# Patient Record
Sex: Male | Born: 2000
Health system: Southern US, Community
[De-identification: ages and names within clinical notes are randomized; demographics above are authoritative.]

## PROBLEM LIST (undated history)

## (undated) DIAGNOSIS — J45909 Unspecified asthma, uncomplicated: Secondary | ICD-10-CM

## (undated) DIAGNOSIS — L709 Acne, unspecified: Secondary | ICD-10-CM

## (undated) DIAGNOSIS — K219 Gastro-esophageal reflux disease without esophagitis: Secondary | ICD-10-CM

## (undated) DIAGNOSIS — R519 Headache, unspecified: Secondary | ICD-10-CM

## (undated) DIAGNOSIS — T7840XA Allergy, unspecified, initial encounter: Secondary | ICD-10-CM

## (undated) DIAGNOSIS — G8929 Other chronic pain: Secondary | ICD-10-CM

## (undated) DIAGNOSIS — F32A Depression, unspecified: Secondary | ICD-10-CM

## (undated) DIAGNOSIS — F419 Anxiety disorder, unspecified: Secondary | ICD-10-CM

## (undated) HISTORY — DX: Allergy, unspecified, initial encounter: T78.40XA

## (undated) HISTORY — PX: UPPER GASTROINTESTINAL ENDOSCOPY: SHX188

## (undated) HISTORY — PX: TYMPANOSTOMY TUBE PLACEMENT: SHX32

## (undated) HISTORY — DX: Headache, unspecified: R51.9

## (undated) HISTORY — DX: Gastro-esophageal reflux disease without esophagitis: K21.9

## (undated) HISTORY — DX: Anxiety disorder, unspecified: F41.9

## (undated) HISTORY — DX: Unspecified asthma, uncomplicated: J45.909

## (undated) HISTORY — DX: Other chronic pain: G89.29

## (undated) HISTORY — DX: Acne, unspecified: L70.9

## (undated) HISTORY — DX: Depression, unspecified: F32.A

---
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
## 2021-01-05 ENCOUNTER — Telehealth: Payer: No Typology Code available for payment source | Admitting: Physician Assistant

## 2021-01-05 ENCOUNTER — Other Ambulatory Visit (HOSPITAL_COMMUNITY): Payer: Self-pay

## 2021-01-05 DIAGNOSIS — K047 Periapical abscess without sinus: Secondary | ICD-10-CM

## 2021-01-05 MED ORDER — AMOXICILLIN 500 MG PO CAPS
500.0000 mg | ORAL_CAPSULE | Freq: Three times a day (TID) | ORAL | 0 refills | Status: AC
  Filled 2021-01-05: qty 30, 10d supply, fill #0

## 2021-01-05 NOTE — Progress Notes (Signed)
E-Visit for Dental Pain  We are sorry that you are not feeling well.  Here is how we plan to help!  Based on what you have shared with me in the questionnaire, it sounds like you have a dental abscess. I am glad you have scheduled an appointment with your dentist.   Continue the Aleve. You can also take Tylenol OTC between doses of Aleve when needed. I have prescribed Amoxicillin 500mg  3 times per day for 10 days for infection.  It is imperative that you see a dentist within 10 days of this eVisit to determine the cause of the dental pain and be sure it is adequately treated  A toothache or tooth pain is caused when the nerve in the root of a tooth or surrounding a tooth is irritated. Dental (tooth) infection, decay, injury, or loss of a tooth are the most common causes of dental pain. Pain may also occur after an extraction (tooth is pulled out). Pain sometimes originates from other areas and radiates to the jaw, thus appearing to be tooth pain.Bacteria growing inside your mouth can contribute to gum disease and dental decay, both of which can cause pain. A toothache occurs from inflammation of the central portion of the tooth called pulp. The pulp contains nerve endings that are very sensitive to pain. Inflammation to the pulp or pulpitis may be caused by dental cavities, trauma, and infection.    HOME CARE:   For toothaches: Over-the-counter pain medications such as acetaminophen or ibuprofen may be used. Take these as directed on the package while you arrange for a dental appointment. Avoid very cold or hot foods, because they may make the pain worse. You may get relief from biting on a cotton ball soaked in oil of cloves. You can get oil of cloves at most drug stores.  For jaw pain:  Aspirin may be helpful for problems in the joint of the jaw in adults. If pain happens every time you open your mouth widely, the temporomandibular joint (TMJ) may be the source of the pain. Yawning or taking  a large bite of food may worsen the pain. An appointment with your doctor or dentist will help you find the cause.     GET HELP RIGHT AWAY IF:  You have a high fever or chills If you have had a recent head or face injury and develop headache, light headedness, nausea, vomiting, or other symptoms that concern you after an injury to your face or mouth, you could have a more serious injury in addition to your dental injury. A facial rash associated with a toothache: This condition may improve with medication. Contact your doctor for them to decide what is appropriate. Any jaw pain occurring with chest pain: Although jaw pain is most commonly caused by dental disease, it is sometimes referred pain from other areas. People with heart disease, especially people who have had stents placed, people with diabetes, or those who have had heart surgery may have jaw pain as a symptom of heart attack or angina. If your jaw or tooth pain is associated with lightheadedness, sweating, or shortness of breath, you should see a doctor as soon as possible. Trouble swallowing or excessive pain or bleeding from gums: If you have a history of a weakened immune system, diabetes, or steroid use, you may be more susceptible to infections. Infections can often be more severe and extensive or caused by unusual organisms. Dental and gum infections in people with these conditions may require more  aggressive treatment. An abscess may need draining or IV antibiotics, for example.  MAKE SURE YOU   Understand these instructions. Will watch your condition. Will get help right away if you are not doing well or get worse.  Thank you for choosing an e-visit.  Your e-visit answers were reviewed by a board certified advanced clinical practitioner to complete your personal care plan. Depending upon the condition, your plan could have included both over the counter or prescription medications.  Please review your pharmacy choice. Make  sure the pharmacy is open so you can pick up prescription now. If there is a problem, you may contact your provider through [employer] and have the prescription routed to another pharmacy.  Your safety is important to [place]. If you have drug allergies check your prescription carefully.   For the next 24 hours you can use MyChart to ask questions about today's visit, request a non-urgent call back, or ask for a work or school excuse. You will get an email in the next two days asking about your experience. I hope that your e-visit has been valuable and will speed your recovery.

## 2021-01-05 NOTE — Progress Notes (Signed)
I have spent 5 minutes in review of e-visit questionnaire, review and updating patient chart, medical decision making and response to patient.   Keyleen Cerrato Cody Shawnita Krizek, PA-C    

## 2021-01-26 ENCOUNTER — Other Ambulatory Visit (HOSPITAL_COMMUNITY): Payer: Self-pay

## 2021-02-01 ENCOUNTER — Other Ambulatory Visit: Payer: Self-pay | Admitting: Family Medicine

## 2021-02-01 DIAGNOSIS — F320 Major depressive disorder, single episode, mild: Secondary | ICD-10-CM

## 2021-02-02 ENCOUNTER — Other Ambulatory Visit (HOSPITAL_COMMUNITY): Payer: Self-pay

## 2021-02-02 MED ORDER — ESCITALOPRAM OXALATE 10 MG PO TABS
10.0000 mg | ORAL_TABLET | Freq: Every day | ORAL | 3 refills | Status: DC
  Filled 2021-02-02: qty 30, 30d supply, fill #0
  Filled 2021-03-06: qty 30, 30d supply, fill #1
  Filled 2021-04-01: qty 30, 30d supply, fill #2
  Filled 2021-04-27: qty 30, 30d supply, fill #3

## 2021-02-02 MED ORDER — OMEPRAZOLE 40 MG PO CPDR
40.0000 mg | DELAYED_RELEASE_CAPSULE | Freq: Every day | ORAL | 1 refills | Status: DC
  Filled 2021-02-02: qty 30, 30d supply, fill #0
  Filled 2021-03-06: qty 30, 30d supply, fill #1

## 2021-03-06 ENCOUNTER — Encounter: Payer: Self-pay | Admitting: Gastroenterology

## 2021-03-06 ENCOUNTER — Other Ambulatory Visit (HOSPITAL_COMMUNITY): Payer: Self-pay

## 2021-03-06 ENCOUNTER — Other Ambulatory Visit: Payer: Self-pay | Admitting: Sports Medicine

## 2021-03-06 DIAGNOSIS — M545 Low back pain, unspecified: Secondary | ICD-10-CM

## 2021-03-06 DIAGNOSIS — G8929 Other chronic pain: Secondary | ICD-10-CM

## 2021-03-06 MED ORDER — MELOXICAM 15 MG PO TABS
15.0000 mg | ORAL_TABLET | Freq: Every morning | ORAL | 3 refills | Status: DC
  Filled 2021-03-06: qty 30, 30d supply, fill #0
  Filled 2021-04-01: qty 30, 30d supply, fill #1
  Filled 2021-05-05: qty 30, 30d supply, fill #2
  Filled 2021-06-02: qty 30, 30d supply, fill #3

## 2021-03-07 ENCOUNTER — Other Ambulatory Visit: Payer: Self-pay

## 2021-03-07 ENCOUNTER — Encounter: Payer: Self-pay | Admitting: Family Medicine

## 2021-03-07 ENCOUNTER — Ambulatory Visit (INDEPENDENT_AMBULATORY_CARE_PROVIDER_SITE_OTHER): Payer: No Typology Code available for payment source | Admitting: Family Medicine

## 2021-03-07 VITALS — BP 114/78 | HR 80 | Temp 98.7°F | Ht 70.0 in | Wt 159.0 lb

## 2021-03-07 DIAGNOSIS — R399 Unspecified symptoms and signs involving the genitourinary system: Secondary | ICD-10-CM | POA: Diagnosis not present

## 2021-03-07 DIAGNOSIS — R300 Dysuria: Secondary | ICD-10-CM | POA: Diagnosis not present

## 2021-03-07 LAB — POCT URINALYSIS DIP (CLINITEK)
Bilirubin, UA: NEGATIVE
Blood, UA: NEGATIVE
Glucose, UA: NEGATIVE mg/dL
Ketones, POC UA: NEGATIVE mg/dL
Leukocytes, UA: NEGATIVE
Nitrite, UA: NEGATIVE
POC PROTEIN,UA: NEGATIVE
Spec Grav, UA: 1.025 (ref 1.010–1.025)
Urobilinogen, UA: 0.2 E.U./dL
pH, UA: 5.5 (ref 5.0–8.0)

## 2021-03-07 NOTE — Progress Notes (Signed)
Acute Office Visit  Subjective:    Patient ID: Christopher Forbes, male    DOB: 08-22-2000, 20 y.o.   MRN: [id number]  Chief Complaint  Patient presents with   Urinary Tract Infection     Patient is in today for dysuria.  Three days ago, patient started noticing some dysuria. He describes a constant discomfort, but when he urinates it feels like he is "peeing glass" and pain is 6/10. He denies any hematuria, fevers, abdominal, flank, new back pain (hx of back pain/injury), penile/scrotal pain, swelling, rashes, discharge. Reports that he is sexually active.   Patient wanted mom to stay in the room for appointment.   Past Medical History:  Diagnosis Date   Acne    Allergy     Past Surgical History:  Procedure Laterality Date   OPEN REDUCTION INTERNAL FIXATION (ORIF) METACARPAL Right 11/19/2018   Procedure: Right small finger metacarpal open malunion repair;  Surgeon: Iran Planas, MD;  Location: [place];  Service: Orthopedics;  Laterality: Right;  90 minutes   TYMPANOSTOMY TUBE PLACEMENT Bilateral    placed age 31 have fallen out    Family History  Problem Relation Age of Onset   Diabetes Father    Hyperlipidemia Father    Hypertension Father    COPD Father    Asthma Father     Social History   Socioeconomic History   Marital status: Single    Spouse name: Not on file   Number of children: Not on file   Years of education: Not on file   Highest education level: Not on file  Occupational History   Occupation: student  Tobacco Use   Smoking status: Never   Smokeless tobacco: Never  Vaping Use   Vaping Use: Never used  Substance and Sexual Activity   Alcohol use: No    Alcohol/week: 0.0 standard drinks   Drug use: No   Sexual activity: Yes    Partners: Male  Other Topics Concern   Not on file  Social History Narrative   Patient lives with brother step sister and step dad Management consultant. Mom is involved.   Social Determinants of Health    Financial Resource Strain: Not on file  Food Insecurity: Not on file  Transportation Needs: Not on file  Physical Activity: Not on file  Stress: Not on file  Social Connections: Not on file  Intimate Partner Violence: Not on file    Outpatient Medications Prior to Visit  Medication Sig Dispense Refill   albuterol (VENTOLIN HFA) 108 (90 Base) MCG/ACT inhaler Inhale 2 puffs into the lungs every 6 (six) hours as needed for wheezing or shortness of breath. 8 g 2   albuterol (VENTOLIN HFA) 108 (90 Base) MCG/ACT inhaler Inhale 2 puffs into the lungs 3 (three) times daily then 1 to 2 puffs every 4 hours as needed for shortness of breath and bronchospasms. 18 g 0   cetirizine (ZYRTEC ALLERGY) 10 MG tablet Take 1 tablet (10 mg total) by mouth daily. 30 tablet 0   chlorhexidine (PERIDEX) 0.12 % solution Swish and spit 15ms by mouth for 2 full minutes 2 times daily 473 mL 0   COVID-19 At Home Antigen Test ([facility] COVID-19 HOME TEST) KIT Use as directed 4 each 0   escitalopram (LEXAPRO) 10 MG tablet Take 1 tablet (10 mg total) by mouth at bedtime. 30 tablet 3   fluticasone (FLONASE) 50 MCG/ACT nasal spray Place 1 spray into both nostrils daily. 16 g 0  meloxicam (MOBIC) 15 MG tablet Take 1 tablet (15 mg total) by mouth in the morning with a meal for 2 weeks then 1 tablet daily as needed for pain. (Take with food) 30 tablet 3   omeprazole (PRILOSEC) 40 MG capsule Take 1 capsule (40 mg total) by mouth daily 30 miuntes before a meal 30 capsule 1   ondansetron (ZOFRAN-ODT) 4 MG disintegrating tablet TAKE 1 TABLET (4 MG TOTAL) BY MOUTH EVERY 8 (EIGHT) HOURS AS NEEDED FOR NAUSEA OR VOMITING. 20 tablet 0   SODIUM FLUORIDE 5000 ENAMEL 1.1-5 % GEL USE TO BRUSH TEETH TWICE DAILY     Spacer/Aero-Holding Chambers (OPTICHAMBER DIAMOND) MISC      mupirocin ointment (BACTROBAN) 2 % Apply 1 application topically 2 (two) times daily. 22 g 0   predniSONE (DELTASONE) 50 MG tablet Take 1 tablet (50 mg total) by  mouth daily. 5 tablet 0   No facility-administered medications prior to visit.    Allergies  Allergen Reactions   No Healthtouch Food Allergies Nausea And Vomiting    Chicken, Milk   Eggs Or Egg-Derived Products Nausea And Vomiting    Review of Systems All review of systems negative except what is listed in the HPI     Objective:    Physical Exam Vitals reviewed.  Constitutional:      Appearance: Normal appearance.  Abdominal:     Tenderness: There is no right CVA tenderness or left CVA tenderness.  Genitourinary:    Comments: Patient deferred  Neurological:     General: No focal deficit present.     Mental Status: He is alert and oriented to person, place, and time.  Psychiatric:        Mood and Affect: Mood normal.        Behavior: Behavior normal.        Thought Content: Thought content normal.        Judgment: Judgment normal.    BP 114/78 (BP Location: Left Arm, Patient Position: Sitting, Cuff Size: Normal)    Pulse 80    Temp 98.7 F (37.1 C) (Oral)    Ht _0  (1.778 m)    Wt 159 lb (72.1 kg)    SpO2 100%    BMI 22.81 kg/m  Wt Readings from Last 3 Encounters:  03/07/21 159 lb (72.1 kg) (59 %, Z= 0.23)*  09/21/20 159 lb (72.1 kg) (62 %, Z= 0.30)*  09/01/20 158 lb (71.7 kg) (61 %, Z= 0.27)*   * Growth percentiles are based on CDC (Boys, 2-20 Years) data.    Health Maintenance Due  Topic Date Due   HIV Screening  Never done   Hepatitis C Screening  Never done    There are no preventive care reminders to display for this patient.   Lab Results  Component Value Date   TSH 4.76 (H) 05/25/2020   Lab Results  Component Value Date   WBC 3.9 (L) 09/20/2020   HGB 15.3 09/20/2020   HCT 45.8 09/20/2020   MCV 91.4 09/20/2020   PLT 204 09/20/2020   Lab Results  Component Value Date   NA 141 09/20/2020   K 4.3 09/20/2020   CO2 27 09/20/2020   GLUCOSE 83 09/20/2020   BUN 16 09/20/2020   CREATININE 0.88 09/20/2020   BILITOT 0.6 09/20/2020   AST 35  (H) 09/20/2020   ALT 47 (H) 09/20/2020   PROT 7.4 09/20/2020   CALCIUM 10.1 09/20/2020   No results found for: CHOL No results found for:  HDL No results found for: LDLCALC No results found for: TRIG No results found for: CHOLHDL No results found for: HGBA1C     Assessment & Plan:    1. UTI symptoms 2. Dysuria UA unremarkable.  Will send for microscopic urinalysis and culture. Also adding in gonorrhea/chlamydia screening. We will update patient with results.  Recommend he stay hydrated, continue good hygiene, practice safe sex.  Patient aware of signs/symptoms requiring further or urgent evaluation.  - C. trachomatis/N. gonorrhoeae RNA - Urinalysis, Routine w reflex microscopic - Urine Culture - POCT URINALYSIS DIP (CLINITEK)   Follow-up pending results or as needed.  Purcell Nails Olevia Bowens, DNP, FNP-C

## 2021-03-09 ENCOUNTER — Encounter: Payer: Self-pay | Admitting: Family Medicine

## 2021-03-09 ENCOUNTER — Other Ambulatory Visit: Payer: Self-pay | Admitting: Family Medicine

## 2021-03-09 ENCOUNTER — Other Ambulatory Visit (HOSPITAL_COMMUNITY): Payer: Self-pay

## 2021-03-09 DIAGNOSIS — R300 Dysuria: Secondary | ICD-10-CM

## 2021-03-09 DIAGNOSIS — N342 Other urethritis: Secondary | ICD-10-CM

## 2021-03-09 LAB — URINE CULTURE
MICRO NUMBER:: 12783789
Result:: NO GROWTH
SPECIMEN QUALITY:: ADEQUATE

## 2021-03-09 LAB — TIQ-MISC

## 2021-03-09 LAB — C. TRACHOMATIS/N. GONORRHOEAE RNA
C. trachomatis RNA, TMA: NOT DETECTED
N. gonorrhoeae RNA, TMA: NOT DETECTED

## 2021-03-09 MED ORDER — DOXYCYCLINE HYCLATE 100 MG PO TABS
100.0000 mg | ORAL_TABLET | Freq: Two times a day (BID) | ORAL | 0 refills | Status: AC
  Filled 2021-03-09: qty 14, 7d supply, fill #0

## 2021-03-22 ENCOUNTER — Encounter: Payer: Self-pay | Admitting: Family Medicine

## 2021-03-22 ENCOUNTER — Ambulatory Visit (INDEPENDENT_AMBULATORY_CARE_PROVIDER_SITE_OTHER): Payer: No Typology Code available for payment source | Admitting: Family Medicine

## 2021-03-22 ENCOUNTER — Ambulatory Visit (INDEPENDENT_AMBULATORY_CARE_PROVIDER_SITE_OTHER): Payer: No Typology Code available for payment source

## 2021-03-22 ENCOUNTER — Other Ambulatory Visit: Payer: Self-pay

## 2021-03-22 VITALS — BP 118/61 | HR 80 | Temp 98.6°F | Ht 70.0 in | Wt 159.0 lb

## 2021-03-22 DIAGNOSIS — R079 Chest pain, unspecified: Secondary | ICD-10-CM | POA: Diagnosis not present

## 2021-03-22 NOTE — Progress Notes (Signed)
Christopher Forbes - 21 y.o. male MRN [id number]  Date of birth: 03/17/2001  Subjective Chief Complaint  Patient presents with   chest soreness    HPI Christopher Forbes is a 21 year old male here today with complaint of chest pain.  Reports having episode of vomiting a few days ago.  Since that time he has had increased right-sided chest pain that is worsened after eating.  He denies any further nausea or vomiting.  He has not noted any blood in his stool or dark stools.  Bowels are moving normally.  He denies fever, chills or shortness of breath.  ROS:  A comprehensive ROS was completed and negative except as noted per HPI  Allergies  Allergen Reactions   No Healt[facility] Food Allergies Nausea And Vomiting    Chicken, Milk   Eggs Or Egg-Derived Products Nausea And Vomiting    Past Medical History:  Diagnosis Date   Acne    Allergy     Past Surgical History:  Procedure Laterality Date   OPEN REDUCTION INTERNAL FIXATION (ORIF) METACARPAL Right 11/19/2018   Procedure: Right small finger metacarpal open malunion repair;  Surgeon: Bradly Bienenstock, MD;  Location:  [facility];  Service: Orthopedics;  Laterality: Right;  90 minutes   TYMPANOSTOMY TUBE PLACEMENT Bilateral    placed age 21 have fallen out    Social History   Socioeconomic History   Marital status: Single    Spouse name: Not on file   Number of children: Not on file   Years of education: Not on file   Highest education level: Not on file  Occupational History   Occupation: student  Tobacco Use   Smoking status: Never   Smokeless tobacco: Never  Vaping Use   Vaping Use: Never used  Substance and Sexual Activity   Alcohol use: No    Alcohol/week: 0.0 standard drinks   Drug use: No   Sexual activity: Yes    Partners: Male  Other Topics Concern   Not on file  Social History Narrative   Patient lives with brother step sister and step dad Transport planner. Mom is involved.   Social Determinants of Health   Financial  Resource Strain: Not on file  Food Insecurity: Not on file  Transportation Needs: Not on file  Physical Activity: Not on file  Stress: Not on file  Social Connections: Not on file    Family History  Problem Relation Age of Onset   Diabetes Father    Hyperlipidemia Father    Hypertension Father    COPD Father    Asthma Father     Health Maintenance  Topic Date Due   HIV Screening  Never done   Hepatitis C Screening  Never done   COVID-19 Vaccine (3 - Booster for Pfizer series) 03/23/2021 (Originally 02/04/2020)   TETANUS/TDAP  01/08/2022   Pneumococcal Vaccine 38-72 Years old  Completed   HPV VACCINES  Completed     ----------------------------------------------------------------------------------------------------------------------------------------------------------------------------------------------------------------- Physical Exam BP 118/61 (BP Location: Left Arm, Patient Position: Sitting, Cuff Size: Normal)    Pulse 80    Temp 98.6 F (37 C)    Ht 5\' 10"  (1.778 m)    Wt 159 lb (72.1 kg)    SpO2 100%    BMI 22.81 kg/m   Physical Exam Constitutional:      Appearance: Normal appearance.  Eyes:     General: No scleral icterus. Cardiovascular:     Rate and Rhythm: Normal rate and regular rhythm.  Pulmonary:  Effort: Pulmonary effort is normal.     Breath sounds: Normal breath sounds.  Musculoskeletal:     Cervical back: Neck supple.  Neurological:     General: No focal deficit present.     Mental Status: He is alert.  Psychiatric:        Mood and Affect: Mood normal.        Behavior: Behavior normal.    ------------------------------------------------------------------------------------------------------------------------------------------------------------------------------------------------------------------- Assessment and Plan  Chest pain I do have concern about possible esophageal rupture with new chest pain that developed after vomiting and is worse  after eating.  Stat CT of the chest ordered today as esophagram is not readily available at our facility.  Update: CT chest is negative.  Recommend increasing PPI for now.   No orders of the defined types were placed in this encounter.   No follow-ups on file.    This visit occurred during the SARS-CoV-2 public health emergency.  Safety protocols were in place, including screening questions prior to the visit, additional usage of staff PPE, and extensive cleaning of exam room while observing appropriate contact time as indicated for disinfecting solutions.

## 2021-03-22 NOTE — Assessment & Plan Note (Signed)
I do have concern about possible esophageal rupture with new chest pain that developed after vomiting and is worse after eating.  Stat CT of the chest ordered today as esophagram is not readily available at our facility.  Update: CT chest is negative.  Recommend increasing PPI for now.

## 2021-03-27 ENCOUNTER — Other Ambulatory Visit: Payer: Self-pay | Admitting: Family Medicine

## 2021-03-27 ENCOUNTER — Other Ambulatory Visit (HOSPITAL_COMMUNITY): Payer: Self-pay

## 2021-03-27 MED ORDER — OMEPRAZOLE 40 MG PO CPDR
40.0000 mg | DELAYED_RELEASE_CAPSULE | Freq: Every day | ORAL | 1 refills | Status: DC
  Filled 2021-03-27: qty 30, 30d supply, fill #0

## 2021-03-28 ENCOUNTER — Other Ambulatory Visit (HOSPITAL_COMMUNITY): Payer: Self-pay

## 2021-03-28 ENCOUNTER — Other Ambulatory Visit: Payer: Self-pay | Admitting: Family Medicine

## 2021-03-28 MED ORDER — OMEPRAZOLE 40 MG PO CPDR
40.0000 mg | DELAYED_RELEASE_CAPSULE | Freq: Two times a day (BID) | ORAL | 3 refills | Status: DC
  Filled 2021-03-28 (×2): qty 60, 30d supply, fill #0
  Filled 2021-04-27: qty 60, 30d supply, fill #1
  Filled 2021-07-03: qty 60, 30d supply, fill #2
  Filled 2021-08-24: qty 60, 30d supply, fill #3

## 2021-04-01 ENCOUNTER — Other Ambulatory Visit (HOSPITAL_COMMUNITY): Payer: Self-pay

## 2021-04-03 ENCOUNTER — Other Ambulatory Visit (HOSPITAL_COMMUNITY): Payer: Self-pay

## 2021-04-04 ENCOUNTER — Other Ambulatory Visit (HOSPITAL_COMMUNITY): Payer: Self-pay

## 2021-04-04 ENCOUNTER — Ambulatory Visit: Payer: No Typology Code available for payment source | Admitting: Family Medicine

## 2021-04-04 ENCOUNTER — Other Ambulatory Visit (HOSPITAL_BASED_OUTPATIENT_CLINIC_OR_DEPARTMENT_OTHER): Payer: Self-pay

## 2021-04-04 HISTORY — PX: WISDOM TOOTH EXTRACTION: SHX21

## 2021-04-04 MED ORDER — HYDROCODONE-ACETAMINOPHEN 5-325 MG PO TABS
1.0000 | ORAL_TABLET | ORAL | 0 refills | Status: DC
Start: 2021-04-04 — End: 2021-04-19
  Filled 2021-04-04: qty 14, 3d supply, fill #0

## 2021-04-04 MED ORDER — PENICILLIN V POTASSIUM 500 MG PO TABS
500.0000 mg | ORAL_TABLET | Freq: Four times a day (QID) | ORAL | 0 refills | Status: DC
  Filled 2021-04-04: qty 28, 7d supply, fill #0

## 2021-04-04 MED ORDER — PENICILLIN V POTASSIUM 500 MG PO TABS
ORAL_TABLET | ORAL | 0 refills | Status: DC
  Filled 2021-04-04: qty 28, 7d supply, fill #0

## 2021-04-04 MED ORDER — HYDROCODONE-ACETAMINOPHEN 5-325 MG PO TABS
ORAL_TABLET | ORAL | 0 refills | Status: DC
  Filled 2021-04-04: qty 14, 3d supply, fill #0

## 2021-04-05 ENCOUNTER — Encounter: Payer: Self-pay | Admitting: Gastroenterology

## 2021-04-05 ENCOUNTER — Ambulatory Visit (INDEPENDENT_AMBULATORY_CARE_PROVIDER_SITE_OTHER): Payer: No Typology Code available for payment source | Admitting: Gastroenterology

## 2021-04-05 VITALS — BP 100/80 | HR 106 | Ht 70.0 in | Wt 157.0 lb

## 2021-04-05 DIAGNOSIS — R1310 Dysphagia, unspecified: Secondary | ICD-10-CM

## 2021-04-05 DIAGNOSIS — R112 Nausea with vomiting, unspecified: Secondary | ICD-10-CM | POA: Diagnosis not present

## 2021-04-05 NOTE — Patient Instructions (Signed)
If you are age 20 or older, your body mass index should be between 23-30. Your Body mass index is 22.53 kg/m. If this is out of the aforementioned range listed, please consider follow up with your Primary Care Provider.  If you are age 53 or younger, your body mass index should be between 19-25. Your Body mass index is 22.53 kg/m. If this is out of the aformentioned range listed, please consider follow up with your Primary Care Provider.   You have been scheduled for an endoscopy. Please follow written instructions given to you at your visit today. If you use inhalers (even only as needed), please bring them with you on the day of your procedure.  The [facility] providers would like to encourage you to use [facility] to communicate with providers for non-urgent requests or questions.  Due to long hold times on the telephone, sending your provider a message by [facility] may be a faster and more efficient way to get a response.  Please allow 48 business hours for a response.  Please remember that this is for non-urgent requests.   It was a pleasure to see you today!  Thank you for trusting me with your gastrointestinal care!    Scott E.Candis Schatz, MD

## 2021-04-05 NOTE — Progress Notes (Signed)
HPI : Christopher Forbes is a very pleasant 21 year old male with asthma and history of food allergies who is referred to us by Dr. Everrett Coombeody Matthews for further evaluation of dysphagia as well as nausea vomiting with weight loss.  Patient states that he first noted swallowing issues a few years ago, but it has been worsened in the past year.  He states that food feels like it gets stuck at the top of his throat, and when he tries to wash it down with water, sometimes the water comes up and chokes him.  He does not have dysphagia with all foods, it seems to be most notable with breads and starchy foods like potatoes.  He denies any episodes of having food getting stuck and having to be forcefully vomited back up.  No liquid dysphagia.  He denies symptoms of heartburn, but does have occasional acid regurgitation.  He denies problems with abdominal pain.  He does have bouts of nausea and vomiting which started within the past year.  The symptoms or not typically associated with meals.  He does not think they are associated with any stress or anxiety or emotional triggers.  He states that he will just suddenly get nauseated and feel the need to vomit.  Sometimes he vomits food, other times its just bilious liquid or phlegm.  He states that he lost about 12 pounds over the past year, but has regained a little bit of it back.  He reports regular bowel movements with occasional loose stools or constipation.  No blood in the stool. He has been taking omeprazole for almost a year, and was recently increased to twice daily.  Just a few weeks ago, he went to the emergency department because of severe chest pain following a bout of emesis.  A CT was performed which was unremarkable.  As when his omeprazole was increased to twice a day.  He also had a right upper quadrant ultrasound in July of this year to evaluate his nausea and vomiting which was unremarkable. He apparently has a history of multiple food allergies based on either  skin or blood test.  His mother reports he has allergies to egg, milk, wheat and chicken which was found when he was a young child.  Repeat allergy testing when he was a bit older indicated that he did not have an allergy to wheat, and he has been eating wheat since.  He does still eat these of the foods on occasion, and his symptoms are typically upset stomach.  He denies any history of throat swelling, hives, itching with consumption of these foods.   Past Medical History:  Diagnosis Date   Acne    Allergy    Anxiety    Asthma    Chronic headaches    Depression      Past Surgical History:  Procedure Laterality Date   OPEN REDUCTION INTERNAL FIXATION (ORIF) METACARPAL Right 11/19/2018   Procedure: Right small finger metacarpal open malunion repair;  Surgeon: Bradly Bienenstockrtmann, Fred, MD;  Location: [facility];  Service: Orthopedics;  Laterality: Right;  90 minutes   TYMPANOSTOMY TUBE PLACEMENT Bilateral    placed age 67 have fallen out   WISDOM TOOTH EXTRACTION  04/04/2021   Family History  Problem Relation Age of Onset   Diabetes Father    Hyperlipidemia Father    Hypertension Father    COPD Father    Asthma Father    Social History   Tobacco Use   Smoking status:  Never   Smokeless tobacco: Never  Vaping Use   Vaping Use: Never used  Substance Use Topics   Alcohol use: No    Alcohol/week: 0.0 standard drinks   Drug use: No   Current Outpatient Medications  Medication Sig Dispense Refill   albuterol (VENTOLIN HFA) 108 (90 Base) MCG/ACT inhaler Inhale 2 puffs into the lungs every 6 (six) hours as needed for wheezing or shortness of breath. 8 g 2   cetirizine (ZYRTEC ALLERGY) 10 MG tablet Take 1 tablet (10 mg total) by mouth daily. 30 tablet 0   chlorhexidine (PERIDEX) 0.12 % solution Swish and spit by mouth for 2 full minutes 2 times daily 473 mL 0   escitalopram (LEXAPRO) 10 MG tablet Take 1 tablet (10 mg total) by mouth at bedtime. 30 tablet 3   fluticasone  (FLONASE) 50 MCG/ACT nasal spray Place 1 spray into both nostrils daily. 16 g 0   HYDROcodone-acetaminophen (NORCO/VICODIN) 5-325 MG tablet Take 1 tablet by mouth every 4-6 hours as needed for pain 14 tablet 0   meloxicam (MOBIC) 15 MG tablet Take 1 tablet (15 mg total) by mouth in the morning with a meal for 2 weeks then 1 tablet daily as needed for pain. (Take with food) 30 tablet 3   omeprazole (PRILOSEC) 40 MG capsule Take 1 capsule (40 mg total) by mouth in the morning and at bedtime. 60 capsule 3   ondansetron (ZOFRAN-ODT) 4 MG disintegrating tablet TAKE 1 TABLET (4 MG TOTAL) BY MOUTH EVERY 8 (EIGHT) HOURS AS NEEDED FOR NAUSEA OR VOMITING. 20 tablet 0   penicillin v potassium (VEETID) 500 MG tablet Take 1 tablet (500 mg total) by mouth 4 (four) times daily until complete 28 tablet 0   SODIUM FLUORIDE 5000 ENAMEL 1.1-5 % GEL USE TO BRUSH TEETH TWICE DAILY     Spacer/Aero-Holding Chambers (OPTICHAMBER DIAMOND) MISC      No current facility-administered medications for this visit.   Allergies  Allergen Reactions   No [facility] Food Allergies Nausea And Vomiting    Chicken, Milk   Eggs Or Egg-Derived Products Nausea And Vomiting     Review of Systems: All systems reviewed and negative except where noted in HPI.    CT Chest Wo Contrast  Result Date: 03/22/2021 CLINICAL DATA:  Chest pain after vomiting and eating/drinking. Evaluation for esophageal perforation. EXAM: CT CHEST WITHOUT CONTRAST TECHNIQUE: Multidetector CT imaging of the chest was performed following the standard protocol without IV contrast. COMPARISON:  Chest radiographs 09/01/2020 FINDINGS: Cardiovascular: Normal caliber of the thoracic aorta. Normal heart size. No pericardial effusion. Mediastinum/Nodes: No enlarged axillary, mediastinal, or hilar lymph nodes are identified within limitations of noncontrast technique. The esophagus is largely collapsed. No fluid collection or inflammatory changes are evident in the  mediastinum. Unremarkable thyroid. Small volume homogeneous triangular soft tissue in the anterior mediastinum compatible with residual thymus. Lungs/Pleura: No pleural effusion or pneumothorax. Clear lungs. Patent central airways. Upper Abdomen: Unremarkable. Musculoskeletal: No acute osseous abnormality or suspicious osseous lesion. Small Schmorl's nodes in the mid and lower thoracic spine. Fusion of the left first and second ribs. IMPRESSION: No acute abnormality identified in the chest. If there is persistent clinical concern for an esophageal perforation, consider an esophagram for further evaluation. Electronically Signed   By: Sebastian Ache M.D.   On: 03/22/2021 15:38    Physical Exam: BP 100/80    Pulse (!) 106    Ht 5\' 10"  (1.778 m)    Wt 157 lb (71.2  kg)    SpO2 98%    BMI 22.53 kg/m  Constitutional: Pleasant,well-developed, Caucasian male in no acute distress.  Accompanied by mother HEENT: Normocephalic and atraumatic. Conjunctivae are normal. No scleral icterus. Neck supple.  No masses or thyromegaly Cardiovascular: Normal rate, regular rhythm.  Pulmonary/chest: Effort normal and breath sounds normal. No wheezing, rales or rhonchi. Abdominal: Soft, nondistended, nontender. Bowel sounds active throughout. There are no masses palpable. No hepatomegaly. Extremities: no edema Neurological: Alert and oriented to person place and time. Skin: Skin is warm and dry. No rashes noted. Psychiatric: Normal mood and affect. Behavior is normal.  CBC    Component Value Date/Time   WBC 3.9 (L) 09/20/2020 0000   RBC 5.01 09/20/2020 0000   HGB 15.3 09/20/2020 0000   HCT 45.8 09/20/2020 0000   PLT 204 09/20/2020 0000   MCV 91.4 09/20/2020 0000   MCH 30.5 09/20/2020 0000   MCHC 33.4 09/20/2020 0000   RDW 11.7 09/20/2020 0000   LYMPHSABS 1,271 09/20/2020 0000   EOSABS 51 09/20/2020 0000   BASOSABS 20 09/20/2020 0000    CMP     Component Value Date/Time   NA 141 09/20/2020 0000   K 4.3  09/20/2020 0000   CL 104 09/20/2020 0000   CO2 27 09/20/2020 0000   GLUCOSE 83 09/20/2020 0000   BUN 16 09/20/2020 0000   CREATININE 0.88 09/20/2020 0000   CALCIUM 10.1 09/20/2020 0000   PROT 7.4 09/20/2020 0000   AST 35 (H) 09/20/2020 0000   ALT 47 (H) 09/20/2020 0000   BILITOT 0.6 09/20/2020 0000   GFRNONAA 124 05/25/2020 0954   GFRAA 144 05/25/2020 0954     ASSESSMENT AND PLAN: 21 year old with chronic dysphagia with weight loss.  He has a history of asthma, food allergies as well as anxiety.  No significant GERD symptoms.  I suspect eosinophilic esophagitis given his age and risk factors.  We will plan for an EGD with possible dilation and esophageal biopsies.  Further recommendations will be based on these results.  Continue omeprazole twice a day for now.  Unclear if his nausea and vomiting episodes are directly related to a GI etiology versus more stress/anxiety related.  We will also assess for peptic ulcer disease and H. pylori at the time of his upper endoscopy.  Dysphagia -EGD with esophageal biopsies and possible dilation - Continue omeprazole twice a day  Nausea/vomiting - EGD with gastric biopsies -Continue omeprazole twice a day  The details, risks (including bleeding, perforation, infection, missed lesions, medication reactions and possible hospitalization or surgery if complications occur), benefits, and alternatives to EGD with possible biopsy and possible dilation were discussed with the patient and he consents to proceed.   Saadiya Wilfong E. Tomasa Rand, MD [facility]  CC:  Everrett Coombe, DO

## 2021-04-06 ENCOUNTER — Other Ambulatory Visit: Payer: Self-pay | Admitting: Family Medicine

## 2021-04-06 ENCOUNTER — Encounter: Payer: Self-pay | Admitting: Gastroenterology

## 2021-04-06 ENCOUNTER — Encounter: Payer: Self-pay | Admitting: Family Medicine

## 2021-04-06 ENCOUNTER — Other Ambulatory Visit (HOSPITAL_COMMUNITY): Payer: Self-pay

## 2021-04-06 ENCOUNTER — Other Ambulatory Visit: Payer: Self-pay

## 2021-04-06 ENCOUNTER — Ambulatory Visit (INDEPENDENT_AMBULATORY_CARE_PROVIDER_SITE_OTHER): Payer: No Typology Code available for payment source | Admitting: Family Medicine

## 2021-04-06 DIAGNOSIS — R519 Headache, unspecified: Secondary | ICD-10-CM | POA: Diagnosis not present

## 2021-04-06 DIAGNOSIS — F411 Generalized anxiety disorder: Secondary | ICD-10-CM | POA: Diagnosis not present

## 2021-04-06 MED ORDER — ONDANSETRON 4 MG PO TBDP
4.0000 mg | ORAL_TABLET | Freq: Three times a day (TID) | ORAL | 0 refills | Status: DC | PRN
  Filled 2021-04-06 – 2021-04-17 (×2): qty 20, 7d supply, fill #0

## 2021-04-06 MED ORDER — AMITRIPTYLINE HCL 25 MG PO TABS
ORAL_TABLET | ORAL | 1 refills | Status: DC
  Filled 2021-04-06: qty 30, 30d supply, fill #0
  Filled 2021-05-05: qty 30, 30d supply, fill #1

## 2021-04-06 NOTE — Progress Notes (Signed)
Christopher Forbes - 21 y.o. male MRN [id number]  Date of birth: [birth year]  Subjective Chief Complaint  Patient presents with   Follow-up    HPI Christopher Forbes is a 21 year old male here today for follow-up visit.  Recently had wisdom teeth removed and is currently taking penicillin and Norco as needed.  He does feel Lexapro continues to work well for his anxiety.  He has not noted any significant side effects from this.  He is having some difficulty with sleep as well as some headaches.  He has trouble falling asleep but is able to stay asleep once he falls asleep.  He describes headaches as along the temples as well as the back and top of the head.  These are not unilateral and he denies vision changes or nausea.  ROS:  A comprehensive ROS was completed and negative except as noted per HPI  Allergies  Allergen Reactions   Eggs Or Egg-Derived Products Nausea And Vomiting   No [facility] Food Allergies Nausea And Vomiting    Chicken, Milk    Past Medical History:  Diagnosis Date   Acne    Allergy    Anxiety    Asthma    Chronic headaches    Depression     Past Surgical History:  Procedure Laterality Date   OPEN REDUCTION INTERNAL FIXATION (ORIF) METACARPAL Right 11/19/2018   Procedure: Right small finger metacarpal open malunion repair;  Surgeon: Bradly Bienenstock, MD;  Location: [facility];  Service: Orthopedics;  Laterality: Right;  90 minutes   TYMPANOSTOMY TUBE PLACEMENT Bilateral    placed age 83 have fallen out   WISDOM TOOTH EXTRACTION  04/04/2021    Social History   Socioeconomic History   Marital status: Single    Spouse name: Not on file   Number of children: Not on file   Years of education: Not on file   Highest education level: Not on file  Occupational History   Occupation: student  Tobacco Use   Smoking status: Never   Smokeless tobacco: Never  Vaping Use   Vaping Use: Never used  Substance and Sexual Activity   Alcohol use: No     Alcohol/week: 0.0 standard drinks   Drug use: No   Sexual activity: Yes    Partners: Male  Other Topics Concern   Not on file  Social History Narrative   Patient lives with brother step sister and step dad Transport planner. Mom is involved.   Social Determinants of Health   Financial Resource Strain: Not on file  Food Insecurity: Not on file  Transportation Needs: Not on file  Physical Activity: Not on file  Stress: Not on file  Social Connections: Not on file    Family History  Problem Relation Age of Onset   Diabetes Father    Hyperlipidemia Father    Hypertension Father    COPD Father    Asthma Father     Health Maintenance  Topic Date Due   HIV Screening  Never done   Hepatitis C Screening  Never done   COVID-19 Vaccine (3 - Booster for Pfizer series) 02/04/2020   TETANUS/TDAP  01/08/2022   HPV VACCINES  Completed     ----------------------------------------------------------------------------------------------------------------------------------------------------------------------------------------------------------------- Physical Exam BP 128/81 (BP Location: Left Arm, Patient Position: Sitting, Cuff Size: Normal)    Pulse 82    Temp 97.8 F (36.6 C)    Ht 5\' 10"  (1.778 m)    Wt 157 lb (71.2 kg)    SpO2  100%    BMI 22.53 kg/m   Physical Exam Constitutional:      Appearance: Normal appearance.  Eyes:     General: No scleral icterus. Cardiovascular:     Rate and Rhythm: Normal rate and regular rhythm.  Pulmonary:     Effort: Pulmonary effort is normal.     Breath sounds: Normal breath sounds.  Musculoskeletal:     Cervical back: Neck supple.  Neurological:     General: No focal deficit present.     Mental Status: He is alert.  Psychiatric:        Mood and Affect: Mood normal.        Behavior: Behavior normal.     ------------------------------------------------------------------------------------------------------------------------------------------------------------------------------------------------------------------- Assessment and Plan  Frequent headaches Lesion to be tension headache which is likely related to his stress and anxiety.  We will try adding amitriptyline eliquis this may be helpful for his headaches as well as sleep.  Anxiety state Continue Lexapro at current strength.  Adding low-dose amitriptyline to help with insomnia as well as headache syndrome.   Meds ordered this encounter  Medications   amitriptyline (ELAVIL) 25 MG tablet    Sig: Take 0.5 tablets (12.5 mg total) by mouth at bedtime for 7 days, THEN may increase to 1 tablet (25 mg total) at bedtime.    Dispense:  30 tablet    Refill:  1    Return in about 2 months (around 06/04/2021) for GAD.    This visit occurred during the SARS-CoV-2 public health emergency.  Safety protocols were in place, including screening questions prior to the visit, additional usage of staff PPE, and extensive cleaning of exam room while observing appropriate contact time as indicated for disinfecting solutions.

## 2021-04-06 NOTE — Assessment & Plan Note (Signed)
Continue Lexapro at current strength.  Adding low-dose amitriptyline to help with insomnia as well as headache syndrome.

## 2021-04-06 NOTE — Patient Instructions (Signed)
Add 1/2 tab of amitriptyline at bedtime.  You can increase to full tab after 1 week if needed.  See me again in 2 months.

## 2021-04-06 NOTE — Assessment & Plan Note (Signed)
Lesion to be tension headache which is likely related to his stress and anxiety.  We will try adding amitriptyline eliquis this may be helpful for his headaches as well as sleep.

## 2021-04-14 ENCOUNTER — Other Ambulatory Visit (HOSPITAL_COMMUNITY): Payer: Self-pay

## 2021-04-17 ENCOUNTER — Other Ambulatory Visit (HOSPITAL_COMMUNITY): Payer: Self-pay

## 2021-04-17 MED ORDER — DOXYCYCLINE HYCLATE 100 MG PO TABS
100.0000 mg | ORAL_TABLET | Freq: Two times a day (BID) | ORAL | 0 refills | Status: DC
  Filled 2021-04-17: qty 14, 7d supply, fill #0

## 2021-04-19 ENCOUNTER — Ambulatory Visit (AMBULATORY_SURGERY_CENTER): Payer: No Typology Code available for payment source | Admitting: Gastroenterology

## 2021-04-19 ENCOUNTER — Other Ambulatory Visit: Payer: Self-pay

## 2021-04-19 ENCOUNTER — Encounter: Payer: Self-pay | Admitting: Gastroenterology

## 2021-04-19 VITALS — BP 105/57 | HR 72 | Temp 98.6°F | Resp 15 | Ht 70.0 in | Wt 157.0 lb

## 2021-04-19 DIAGNOSIS — K319 Disease of stomach and duodenum, unspecified: Secondary | ICD-10-CM

## 2021-04-19 DIAGNOSIS — R112 Nausea with vomiting, unspecified: Secondary | ICD-10-CM | POA: Diagnosis not present

## 2021-04-19 DIAGNOSIS — K219 Gastro-esophageal reflux disease without esophagitis: Secondary | ICD-10-CM | POA: Diagnosis not present

## 2021-04-19 DIAGNOSIS — R1310 Dysphagia, unspecified: Secondary | ICD-10-CM | POA: Diagnosis present

## 2021-04-19 DIAGNOSIS — K2289 Other specified disease of esophagus: Secondary | ICD-10-CM

## 2021-04-19 MED ORDER — SODIUM CHLORIDE 0.9 % IV SOLN: 500.0000 mL | Freq: Once | INTRAVENOUS | Status: DC

## 2021-04-19 NOTE — Progress Notes (Signed)
Report to PACU, RN, vss, BBS= Clear.  

## 2021-04-19 NOTE — Op Note (Signed)
[place] Patient Name: Christopher Forbes Procedure Date: 04/19/2021 2:44 PM MRN: [id number] Endoscopist: Nicki Reaper E. Candis Schatz , MD Age: 21 Referring MD:  Date of Birth: 08/15/2000 Gender: Male Account #: [id number] Procedure:                Upper GI endoscopy Indications:              Dysphagia, Nausea with vomiting Medicines:                Monitored Anesthesia Care Procedure:                Pre-Anesthesia Assessment:                           - Prior to the procedure, a History and Physical                            was performed, and patient medications and                            allergies were reviewed. The patient's tolerance of                            previous anesthesia was also reviewed. The risks                            and benefits of the procedure and the sedation                            options and risks were discussed with the patient.                            All questions were answered, and informed consent                            was obtained. Prior Anticoagulants: The patient has                            taken no previous anticoagulant or antiplatelet                            agents. ASA Grade Assessment: II - A patient with                            mild systemic disease. After reviewing the risks                            and benefits, the patient was deemed in                            satisfactory condition to undergo the procedure.                           After obtaining informed consent, the endoscope was  passed under direct vision. Throughout the                            procedure, the patient's blood pressure, pulse, and                            oxygen saturations were monitored continuously. The                            GIF [id number] was introduced through the                            mouth, and advanced to the third part of duodenum.                            The upper GI endoscopy was  accomplished without                            difficulty. The patient tolerated the procedure                            well. Scope In: Scope Out: Findings:                 The examined portions of the nasopharynx,                            oropharynx and larynx were normal.                           The examined esophagus was normal. Biopsies were                            obtained from the proximal and distal esophagus                            with cold forceps for histology of suspected                            eosinophilic esophagitis. Estimated blood loss was                            minimal. A TTS dilator was passed through the                            scope. Dilation with an 18-19-20 mm balloon dilator                            was performed to 20 mm. The dilation site was                            examined and showed no change. Estimated blood                            loss:  none.                           The entire examined stomach was normal. Biopsies                            were taken with a cold forceps for Helicobacter                            pylori testing. Estimated blood loss was minimal.                           The examined duodenum was normal. Complications:            No immediate complications. Estimated Blood Loss:     Estimated blood loss was minimal. Impression:               - The examined portions of the nasopharynx,                            oropharynx and larynx were normal.                           - Normal esophagus. Biopsied. Dilated.                           - Normal stomach. Biopsied.                           - Normal examined duodenum. Recommendation:           - Patient has a contact number available for                            emergencies. The signs and symptoms of potential                            delayed complications were discussed with the                            patient. Return to normal activities tomorrow.                             Written discharge instructions were provided to the                            patient.                           - Resume previous diet.                           - Continue present medications.                           - Await pathology results.                           -  Consider esophageal manometry for further                            evaluation of dysphagia if biopsies normal. Zahava Quant E. Candis Schatz, MD 04/19/2021 3:08:51 PM This report has been signed electronically.

## 2021-04-19 NOTE — Patient Instructions (Signed)
Please read handouts provided. Continue present medications. Await pathology results. Resume previous diet.  YOU HAD AN ENDOSCOPIC PROCEDURE TODAY AT THE [facility]:   Refer to the procedure report that was given to you for any specific questions about what was found during the examination.  If the procedure report does not answer your questions, please call your gastroenterologist to clarify.  If you requested that your care partner not be given the details of your procedure findings, then the procedure report has been included in a sealed envelope for you to review at your convenience later.  YOU SHOULD EXPECT: Some feelings of bloating in the abdomen. Passage of more gas than usual.  Walking can help get rid of the air that was put into your GI tract during the procedure and reduce the bloating. If you had a lower endoscopy (such as a colonoscopy or flexible sigmoidoscopy) you may notice spotting of blood in your stool or on the toilet paper. If you underwent a bowel prep for your procedure, you may not have a normal bowel movement for a few days.  Please Note:  You might notice some irritation and congestion in your nose or some drainage.  This is from the oxygen used during your procedure.  There is no need for concern and it should clear up in a day or so.  SYMPTOMS TO REPORT IMMEDIATELY:    Following upper endoscopy (EGD)  Vomiting of blood or coffee ground material  New chest pain or pain under the shoulder blades  Painful or persistently difficult swallowing  New shortness of breath  Fever of 100F or higher  Black, tarry-looking stools  For urgent or emergent issues, a gastroenterologist can be reached at any hour by calling [phone]. Do not use MyChart messaging for urgent concerns.    DIET:  We do recommend a small meal at first, but then you may proceed to your regular diet.  Drink plenty of fluids but you should avoid alcoholic beverages for 24  hours.  ACTIVITY:  You should plan to take it easy for the rest of today and you should NOT DRIVE or use heavy machinery until tomorrow (because of the sedation medicines used during the test).    FOLLOW UP: Our staff will call the number listed on your records 48-72 hours following your procedure to check on you and address any questions or concerns that you may have regarding the information given to you following your procedure. If we do not reach you, we will leave a message.  We will attempt to reach you two times.  During this call, we will ask if you have developed any symptoms of COVID 19. If you develop any symptoms (ie: fever, flu-like symptoms, shortness of breath, cough etc.) before then, please call [phone].  If you test positive for Covid 19 in the 2 weeks post procedure, please call and report this information to us.    If any biopsies were taken you will be contacted by phone or by letter within the next 1-3 weeks.  Please call us at [phone] if you have not heard about the biopsies in 3 weeks.    SIGNATURES/CONFIDENTIALITY: You and/or your care partner have signed paperwork which will be entered into your electronic medical record.  These signatures attest to the fact that that the information above on your After Visit Summary has been reviewed and is understood.  Full responsibility of the confidentiality of this discharge information lies with you and/or your   care-partner.  

## 2021-04-19 NOTE — Progress Notes (Signed)
Called to room to assist during endoscopic procedure.  Patient ID and intended procedure confirmed with present staff. Received instructions for my participation in the procedure from the performing physician.  

## 2021-04-19 NOTE — Progress Notes (Signed)
History and Physical Interval Note:  04/19/2021 2:44 PM  Christopher Forbes Reason  has presented today for endoscopic procedure(s), with the diagnosis of  Encounter Diagnoses  Name Primary?   Dysphagia, unspecified type Yes   Nausea and vomiting, unspecified vomiting type   .  The various methods of evaluation and treatment have been discussed with the patient and/or family. After consideration of risks, benefits and other options for treatment, the patient has consented to  the endoscopic procedure(s).   The patient's history has been reviewed, patient examined, no change in status, stable for endoscopic procedure(s).  I have reviewed the patient's chart and labs.  Questions were answered to the patient's satisfaction.     Williams Dietrick E. Tomasa Rand, MD [facility]

## 2021-04-19 NOTE — Progress Notes (Signed)
VS-DT 

## 2021-04-21 ENCOUNTER — Telehealth: Payer: Self-pay

## 2021-04-21 NOTE — Telephone Encounter (Signed)
°  Follow up Call-  Call back number 04/19/2021  Post procedure Call Back phone  # [phone]  Permission to leave phone message Yes  Some recent data might be hidden     1st follow up call made. NALM

## 2021-04-27 ENCOUNTER — Other Ambulatory Visit (HOSPITAL_COMMUNITY): Payer: Self-pay

## 2021-04-27 NOTE — Progress Notes (Signed)
Christopher Forbes,  The biopsies taken from your stomach were notable for mild reactive gastropathy which is a common finding and often related to use of certain medications (usually NSAIDs), but there was no evidence of Helicobacter pylori infection. This common finding is not felt to necessarily be a cause of any particular symptom and there is no specific treatment or further evaluation recommended.  The biopsies of your esophagus did not show any findings consistent with eosinophilic esophagitis.  They did show some mild changes suggestive of acid reflux.  Sometimes acid reflux can cause symptoms of difficulty swallowing.  I recommend you continue to take the omeprazole every day for the next 4-6 weeks and see if your symptoms are improved.  If not, we can reconsider the other test that looks at esophageal function (esophageal manometry).

## 2021-05-05 ENCOUNTER — Other Ambulatory Visit (HOSPITAL_COMMUNITY): Payer: Self-pay

## 2021-06-02 ENCOUNTER — Other Ambulatory Visit: Payer: Self-pay | Admitting: Family Medicine

## 2021-06-02 ENCOUNTER — Other Ambulatory Visit (HOSPITAL_COMMUNITY): Payer: Self-pay

## 2021-06-02 DIAGNOSIS — F320 Major depressive disorder, single episode, mild: Secondary | ICD-10-CM

## 2021-06-05 ENCOUNTER — Other Ambulatory Visit (HOSPITAL_COMMUNITY): Payer: Self-pay

## 2021-06-05 MED ORDER — ESCITALOPRAM OXALATE 10 MG PO TABS
10.0000 mg | ORAL_TABLET | Freq: Every day | ORAL | 3 refills | Status: DC
  Filled 2021-06-05: qty 30, 30d supply, fill #0

## 2021-06-05 MED ORDER — AMITRIPTYLINE HCL 25 MG PO TABS
ORAL_TABLET | ORAL | 1 refills | Status: DC
  Filled 2021-06-05: qty 30, 30d supply, fill #0
  Filled 2021-07-03: qty 30, 30d supply, fill #1

## 2021-06-06 ENCOUNTER — Ambulatory Visit (INDEPENDENT_AMBULATORY_CARE_PROVIDER_SITE_OTHER): Payer: Self-pay | Admitting: Family Medicine

## 2021-06-06 ENCOUNTER — Encounter: Payer: Self-pay | Admitting: Family Medicine

## 2021-06-06 ENCOUNTER — Other Ambulatory Visit: Payer: Self-pay

## 2021-06-06 ENCOUNTER — Other Ambulatory Visit (HOSPITAL_COMMUNITY): Payer: Self-pay

## 2021-06-06 VITALS — BP 121/85 | HR 104 | Ht 70.0 in | Wt 166.0 lb

## 2021-06-06 DIAGNOSIS — Z550 Illiteracy and low-level literacy: Secondary | ICD-10-CM

## 2021-06-06 DIAGNOSIS — M545 Low back pain, unspecified: Secondary | ICD-10-CM

## 2021-06-06 DIAGNOSIS — F819 Developmental disorder of scholastic skills, unspecified: Secondary | ICD-10-CM

## 2021-06-06 DIAGNOSIS — F320 Major depressive disorder, single episode, mild: Secondary | ICD-10-CM

## 2021-06-06 DIAGNOSIS — M546 Pain in thoracic spine: Secondary | ICD-10-CM

## 2021-06-06 DIAGNOSIS — F411 Generalized anxiety disorder: Secondary | ICD-10-CM

## 2021-06-06 MED ORDER — ESCITALOPRAM OXALATE 20 MG PO TABS
20.0000 mg | ORAL_TABLET | Freq: Every day | ORAL | 1 refills | Status: DC
  Filled 2021-06-06: qty 90, 90d supply, fill #0
  Filled 2021-08-31: qty 90, 90d supply, fill #1

## 2021-06-06 NOTE — Patient Instructions (Signed)
Increase lexapro to 20mg  daily.  Updated prescription has been sent in.  ?Follow up in 3 months or sooner if needed.  ?

## 2021-06-06 NOTE — Assessment & Plan Note (Signed)
Referral placed to neuropsychology for further evaluation. ?

## 2021-06-06 NOTE — Assessment & Plan Note (Signed)
She has continued back pain.  Referral placed for MRI of thoracic and lumbar spine. ?

## 2021-06-06 NOTE — Assessment & Plan Note (Signed)
Increasing Lexapro to 20 mg daily. 

## 2021-06-06 NOTE — Progress Notes (Signed)
?Christopher Forbes - 21 y.o. male MRN [id number]  Date of birth: July 11, 2000 ? ?Subjective ?No chief complaint on file. ? ? ?HPI ?Christopher Forbes here today for follow-up visit. ? ?Continues to have difficulty with anxiety.  He does feel that Lexapro is helpful however this feels that things could be better controlled.  He would be interested in increasing this. ? ?Continues to have chronic low back pain.  MRI ordered by Dr. Benjamin Stain last year however he was able to have this completed due to presence of ankle monitor.  He has since had this removed.  He would like to proceed with this imaging at this point. ? ?Mother is concerned about his learning difficulties in regards to him being able to work long-term.  He does have difficulty with reading and literacy.  Requesting referral for diagnostic evaluation. ? ?ROS:  A comprehensive ROS was completed and negative except as noted per HPI ? ?Allergies  ?Allergen Reactions  ? Eggs Or Egg-Derived Products Nausea And Vomiting  ? No Healthtouch Food Allergies Nausea And Vomiting  ?  Chicken, Milk  ? ? ?Past Medical History:  ?Diagnosis Date  ? Acne   ? Allergy   ? Anxiety   ? Asthma   ? Chronic headaches   ? Depression   ? GERD (gastroesophageal reflux disease)   ? ? ?Past Surgical History:  ?Procedure Laterality Date  ? OPEN REDUCTION INTERNAL FIXATION (ORIF) METACARPAL Right 11/19/2018  ? Procedure: Right small finger metacarpal open malunion repair;  Surgeon: Bradly Bienenstock, MD;  Location: [facility];  Service: Orthopedics;  Laterality: Right;  90 minutes  ? TYMPANOSTOMY TUBE PLACEMENT Bilateral   ? placed age 40 have fallen out  ? UPPER GASTROINTESTINAL ENDOSCOPY    ? WISDOM TOOTH EXTRACTION  04/04/2021  ? ? ?Social History  ? ?Socioeconomic History  ? Marital status: Single  ?  Spouse name: Not on file  ? Number of children: Not on file  ? Years of education: Not on file  ? Highest education level: Not on file  ?Occupational History  ? Occupation:  student  ?Tobacco Use  ? Smoking status: Never  ? Smokeless tobacco: Never  ?Vaping Use  ? Vaping Use: Former  ?Substance and Sexual Activity  ? Alcohol use: No  ?  Alcohol/week: 0.0 standard drinks  ? Drug use: No  ? Sexual activity: Yes  ?  Partners: Male  ?Other Topics Concern  ? Not on file  ?Social History Narrative  ? Patient lives with brother step sister and step dad [name]. Mom is involved.  ? ?Social Determinants of Health  ? ?Financial Resource Strain: Not on file  ?Food Insecurity: Not on file  ?Transportation Needs: Not on file  ?Physical Activity: Not on file  ?Stress: Not on file  ?Social Connections: Not on file  ? ? ?Family History  ?Problem Relation Age of Onset  ? Diabetes Father   ? Hyperlipidemia Father   ? Hypertension Father   ? COPD Father   ? Asthma Father   ? Colon cancer Neg Hx   ? Esophageal cancer Neg Hx   ? Rectal cancer Neg Hx   ? Stomach cancer Neg Hx   ? ? ?Health Maintenance  ?Topic Date Due  ? HIV Screening  Never done  ? Hepatitis C Screening  Never done  ? COVID-19 Vaccine (3 - Booster for Pfizer series) 02/04/2020  ? TETANUS/TDAP  01/08/2022  ? HPV VACCINES  Completed  ? ? ? ?----------------------------------------------------------------------------------------------------------------------------------------------------------------------------------------------------------------- ?  Physical Exam ?BP 121/85 (BP Location: Left Arm, Patient Position: Sitting, Cuff Size: Normal)   Pulse (!) 104   Ht 5\' 10"  (1.778 m)   Wt 166 lb (75.3 kg)   SpO2 100%   BMI 23.82 kg/m?  ? ?Physical Exam ?Constitutional:   ?   Appearance: Normal appearance.  ?Eyes:  ?   General: No scleral icterus. ?Cardiovascular:  ?   Rate and Rhythm: Normal rate and regular rhythm.  ?Pulmonary:  ?   Effort: Pulmonary effort is normal.  ?   Breath sounds: Normal breath sounds.  ?Musculoskeletal:  ?   Cervical back: Neck supple.  ?Neurological:  ?   Mental Status: He is alert.  ?Psychiatric:     ?   Mood and  Affect: Mood normal.     ?   Behavior: Behavior normal.  ? ? ?------------------------------------------------------------------------------------------------------------------------------------------------------------------------------------------------------------------- ?Assessment and Plan ? ?Anxiety state ?Increasing Lexapro to 20 mg daily. ? ?Learning disability ?Referral placed to neuropsychology for further evaluation. ? ?Back pain of thoracolumbar region ?She has continued back pain.  Referral placed for MRI of thoracic and lumbar spine. ? ? ?Meds ordered this encounter  ?Medications  ? escitalopram (LEXAPRO) 20 MG tablet  ?  Sig: Take 1 tablet (20 mg total) by mouth at bedtime.  ?  Dispense:  90 tablet  ?  Refill:  1  ? ? ?Return in about 3 months (around 09/06/2021) for Mood. ? ? ? ?This visit occurred during the SARS-CoV-2 public health emergency.  Safety protocols were in place, including screening questions prior to the visit, additional usage of staff PPE, and extensive cleaning of exam room while observing appropriate contact time as indicated for disinfecting solutions.  ? ?

## 2021-06-08 ENCOUNTER — Other Ambulatory Visit: Payer: Self-pay | Admitting: Sports Medicine

## 2021-06-08 DIAGNOSIS — Z1389 Encounter for screening for other disorder: Secondary | ICD-10-CM

## 2021-06-11 ENCOUNTER — Other Ambulatory Visit: Payer: Self-pay

## 2021-06-18 ENCOUNTER — Ambulatory Visit (INDEPENDENT_AMBULATORY_CARE_PROVIDER_SITE_OTHER): Payer: Self-pay

## 2021-06-18 DIAGNOSIS — M546 Pain in thoracic spine: Secondary | ICD-10-CM

## 2021-06-18 DIAGNOSIS — M545 Low back pain, unspecified: Secondary | ICD-10-CM

## 2021-06-26 ENCOUNTER — Other Ambulatory Visit: Payer: Self-pay | Admitting: Family Medicine

## 2021-06-26 DIAGNOSIS — G8929 Other chronic pain: Secondary | ICD-10-CM

## 2021-06-26 DIAGNOSIS — M4200 Juvenile osteochondrosis of spine, site unspecified: Secondary | ICD-10-CM

## 2021-07-03 ENCOUNTER — Other Ambulatory Visit (HOSPITAL_COMMUNITY): Payer: Self-pay

## 2021-07-03 ENCOUNTER — Other Ambulatory Visit: Payer: Self-pay | Admitting: Family Medicine

## 2021-07-03 ENCOUNTER — Other Ambulatory Visit: Payer: Self-pay | Admitting: Sports Medicine

## 2021-07-03 DIAGNOSIS — G8929 Other chronic pain: Secondary | ICD-10-CM

## 2021-07-03 MED ORDER — ONDANSETRON 4 MG PO TBDP
4.0000 mg | ORAL_TABLET | Freq: Three times a day (TID) | ORAL | 0 refills | Status: DC | PRN
Start: 2021-07-03 — End: 2021-11-18
  Filled 2021-07-03: qty 20, 7d supply, fill #0

## 2021-07-03 MED ORDER — MELOXICAM 15 MG PO TABS
15.0000 mg | ORAL_TABLET | Freq: Every morning | ORAL | 2 refills | Status: DC
  Filled 2021-07-03: qty 30, 30d supply, fill #0
  Filled 2021-08-02: qty 30, 30d supply, fill #1
  Filled 2021-08-31: qty 30, 30d supply, fill #2

## 2021-07-04 ENCOUNTER — Other Ambulatory Visit (HOSPITAL_COMMUNITY): Payer: Self-pay

## 2021-07-26 ENCOUNTER — Ambulatory Visit: Payer: Medicaid Other | Attending: Family Medicine | Admitting: Rehabilitative and Restorative Service Providers"

## 2021-08-02 ENCOUNTER — Other Ambulatory Visit (HOSPITAL_COMMUNITY): Payer: Self-pay

## 2021-08-02 ENCOUNTER — Other Ambulatory Visit: Payer: Self-pay | Admitting: Family Medicine

## 2021-08-02 MED ORDER — AMITRIPTYLINE HCL 25 MG PO TABS
ORAL_TABLET | ORAL | 1 refills | Status: DC
  Filled 2021-08-02: qty 30, 30d supply, fill #0
  Filled 2021-08-31: qty 30, 30d supply, fill #1

## 2021-08-08 ENCOUNTER — Other Ambulatory Visit (HOSPITAL_COMMUNITY): Payer: Self-pay

## 2021-08-09 ENCOUNTER — Ambulatory Visit (INDEPENDENT_AMBULATORY_CARE_PROVIDER_SITE_OTHER): Payer: Medicaid Other | Admitting: Family Medicine

## 2021-08-09 ENCOUNTER — Other Ambulatory Visit (HOSPITAL_COMMUNITY): Payer: Self-pay

## 2021-08-09 ENCOUNTER — Encounter: Payer: Self-pay | Admitting: Family Medicine

## 2021-08-09 VITALS — BP 122/79 | HR 79 | Ht 70.0 in | Wt 168.0 lb

## 2021-08-09 DIAGNOSIS — Z91012 Allergy to eggs: Secondary | ICD-10-CM | POA: Diagnosis not present

## 2021-08-09 DIAGNOSIS — R519 Headache, unspecified: Secondary | ICD-10-CM

## 2021-08-09 DIAGNOSIS — J310 Chronic rhinitis: Secondary | ICD-10-CM

## 2021-08-09 MED ORDER — KETOROLAC TROMETHAMINE 30 MG/ML IJ SOLN
30.0000 mg | Freq: Once | INTRAMUSCULAR | Status: AC
  Administered 2021-08-09: 30 mg via INTRAMUSCULAR

## 2021-08-09 MED ORDER — PREDNISONE 50 MG PO TABS
50.0000 mg | ORAL_TABLET | Freq: Every day | ORAL | 0 refills | Status: DC
  Filled 2021-08-09: qty 5, 5d supply, fill #0

## 2021-08-09 NOTE — Assessment & Plan Note (Signed)
Seems to be tension type headache based on pattern and symptoms.  Given 30mg  Toradol injection and started on 50mg  prednisone burst x5 days.  Recommend keeping headache journal.  Printed example on AVS.  Instructed to follow up if symptoms are worsening or not resolving.

## 2021-08-09 NOTE — Progress Notes (Signed)
Christopher Forbes - 21 y.o. male MRN [id number]  Date of birth: 2000/07/27  Subjective Chief Complaint  Patient presents with   Headache    HPI Christopher Forbes is a 21 y.o. male here today with complaint of headache.  He reports that he has had a headache off and on for the past few days.  Pain radiates from the top  of the neck into the front of the forehead.  He denies any neurological changes.  He has not had light sensitivity, sensitivity to sound, nausea, vision change, fever or chills.  He reports that he is drinking plenty of fluids and is getting plenty of sleep.   Additionally he is requesting referral to an allergist for allergy testing.  Has history of egg allergy.    ROS:  A comprehensive ROS was completed and negative except as noted per HPI  Allergies  Allergen Reactions   Eggs Or Egg-Derived Products Nausea And Vomiting   No Healthtouch Food Allergies Nausea And Vomiting    Chicken, Milk    Past Medical History:  Diagnosis Date   Acne    Allergy    Anxiety    Asthma    Chronic headaches    Depression    GERD (gastroesophageal reflux disease)     Past Surgical History:  Procedure Laterality Date   OPEN REDUCTION INTERNAL FIXATION (ORIF) METACARPAL Right 11/19/2018   Procedure: Right small finger metacarpal open malunion repair;  Surgeon: Iran Planas, MD;  Location: [facility];  Service: Orthopedics;  Laterality: Right;  90 minutes   TYMPANOSTOMY TUBE PLACEMENT Bilateral    placed age 57 have fallen out   UPPER GASTROINTESTINAL ENDOSCOPY     WISDOM TOOTH EXTRACTION  04/04/2021    Social History   Socioeconomic History   Marital status: Single    Spouse name: Not on file   Number of children: Not on file   Years of education: Not on file   Highest education level: Not on file  Occupational History   Occupation: student  Tobacco Use   Smoking status: Never   Smokeless tobacco: Never  Vaping Use   Vaping Use: Former  Substance and  Sexual Activity   Alcohol use: No    Alcohol/week: 0.0 standard drinks   Drug use: No   Sexual activity: Yes    Partners: Male  Other Topics Concern   Not on file  Social History Narrative   Patient lives with brother step sister and step dad Management consultant. Mom is involved.   Social Determinants of Health   Financial Resource Strain: Not on file  Food Insecurity: Not on file  Transportation Needs: Not on file  Physical Activity: Not on file  Stress: Not on file  Social Connections: Not on file    Family History  Problem Relation Age of Onset   Diabetes Father    Hyperlipidemia Father    Hypertension Father    COPD Father    Asthma Father    Colon cancer Neg Hx    Esophageal cancer Neg Hx    Rectal cancer Neg Hx    Stomach cancer Neg Hx     Health Maintenance  Topic Date Due   COVID-19 Vaccine (3 - Booster for Schaller series) 11/17/2021 (Originally 02/04/2020)   Hepatitis C Screening  08/10/2022 (Originally 12/28/2019)   HIV Screening  08/10/2022 (Originally 12/27/2016)   TETANUS/TDAP  01/08/2022   HPV VACCINES  Completed     ----------------------------------------------------------------------------------------------------------------------------------------------------------------------------------------------------------------- Physical Exam BP 122/79 (BP  Location: Left Arm, Patient Position: Sitting, Cuff Size: Normal)   Pulse 79   Ht 5\' 10"  (1.778 m)   Wt 168 lb (76.2 kg)   SpO2 100%   BMI 24.11 kg/m   Physical Exam Constitutional:      Appearance: He is well-developed.  HENT:     Head: Normocephalic and atraumatic.  Eyes:     Extraocular Movements: Extraocular movements intact.     Pupils: Pupils are equal, round, and reactive to light.  Cardiovascular:     Rate and Rhythm: Normal rate and regular rhythm.  Pulmonary:     Effort: Pulmonary effort is normal.     Breath sounds: Normal breath sounds.  Musculoskeletal:     Cervical back: Normal range of  motion and neck supple.  Neurological:     Mental Status: He is alert and oriented to person, place, and time.     Cranial Nerves: No cranial nerve deficit.  Psychiatric:        Mood and Affect: Mood normal.        Behavior: Behavior normal.    ------------------------------------------------------------------------------------------------------------------------------------------------------------------------------------------------------------------- Assessment and Plan  Frequent headaches Seems to be tension type headache based on pattern and symptoms.  Given 30mg  Toradol injection and started on 50mg  prednisone burst x5 days.  Recommend keeping headache journal.  Printed example on AVS.  Instructed to follow up if symptoms are worsening or not resolving.    Egg allergy Referral placed to allergist.    No orders of the defined types were placed in this encounter.   No follow-ups on file.    This visit occurred during the SARS-CoV-2 public health emergency.  Safety protocols were in place, including screening questions prior to the visit, additional usage of staff PPE, and extensive cleaning of exam room while observing appropriate contact time as indicated for disinfecting solutions.

## 2021-08-09 NOTE — Patient Instructions (Signed)
Start prednisone 50mg  dailly for 5 days. Let me know if headaches are not improving.   Form - Headache Record There are many types and causes of headaches. A headache record can help guide your treatment plan. Use this form to record the details. Bring this form with you to your follow-up visits. Follow your health care provider's instructions on how to describe your headache. You may be asked to: Use a pain scale. This is a tool to rate the intensity of your headache using words or numbers. Describe what your headache feels like, such as dull, achy, throbbing, or sharp. Headache record Date: _______________ Time (from start to end): ____________________ Location of the headache: _________________________ Intensity of the headache: ____________________ Description of the headache: ______________________________________________________________ Hours of sleep the night before the headache: __________ Food or drinks before the headache started: ______________________________________________________________________________________ Events before the headache started: _______________________________________________________________________________________________ Symptoms before the headache started: __________________________________________________________________________________________ Symptoms during the headache: __________________________________________________________________________________________________ Treatment: ________________________________________________________________________________________________________________ Effect of treatment: _________________________________________________________________________________________________________ Other comments: ___________________________________________________________________________________________________________ Date: _______________ Time (from start to end): ____________________ Location of the headache:  _________________________ Intensity of the headache: ____________________ Description of the headache: ______________________________________________________________ Hours of sleep the night before the headache: __________ Food or drinks before the headache started: ______________________________________________________________________________________ Events before the headache started: ____________________________________________________________________________________________ Symptoms before the headache started: _________________________________________________________________________________________ Symptoms during the headache: _______________________________________________________________________________________________ Treatment: ________________________________________________________________________________________________________________ Effect of treatment: _________________________________________________________________________________________________________ Other comments: ___________________________________________________________________________________________________________ Date: _______________ Time (from start to end): ____________________ Location of the headache: _________________________ Intensity of the headache: ____________________ Description of the headache: ______________________________________________________________ Hours of sleep the night before the headache: __________ Food or drinks before the headache started: ______________________________________________________________________________________ Events before the headache started: ____________________________________________________________________________________________ Symptoms before the headache started: _________________________________________________________________________________________ Symptoms during the headache:  _______________________________________________________________________________________________ Treatment: ________________________________________________________________________________________________________________ Effect of treatment: _________________________________________________________________________________________________________ Other comments: ___________________________________________________________________________________________________________ Date: _______________ Time (from start to end): ____________________ Location of the headache: _________________________ Intensity of the headache: ____________________ Description of the headache: ______________________________________________________________ Hours of sleep the night before the headache: _________ Food or drinks before the headache started: ______________________________________________________________________________________ Events before the headache started: ____________________________________________________________________________________________ Symptoms before the headache started: _________________________________________________________________________________________ Symptoms during the headache: _______________________________________________________________________________________________ Treatment: ________________________________________________________________________________________________________________ Effect of treatment: _________________________________________________________________________________________________________ Other comments: ___________________________________________________________________________________________________________ Date: _______________ Time (from start to end): ____________________ Location of the headache: _________________________ Intensity of the headache: ____________________ Description of the headache:  ______________________________________________________________ Hours of sleep the night before the headache: _________ Food or drinks before the headache started: ______________________________________________________________________________________ Events before the headache started: ____________________________________________________________________________________________ Symptoms before the headache started: _________________________________________________________________________________________ Symptoms during the headache: _______________________________________________________________________________________________ Treatment: ________________________________________________________________________________________________________________ Effect of treatment: _________________________________________________________________________________________________________ Other comments: ___________________________________________________________________________________________________________ This information is not intended to replace advice given to you by your health care provider. Make sure you discuss any questions you have with your health care provider. Document Revised: 08/03/2020 Document Reviewed: 08/03/2020 Elsevier Patient Education  2023 2024.

## 2021-08-09 NOTE — Assessment & Plan Note (Signed)
Referral placed to allergist. 

## 2021-08-09 NOTE — Addendum Note (Signed)
Addended by: Ardyth Man on: 08/09/2021 11:23 AM   Modules accepted: Orders

## 2021-08-10 ENCOUNTER — Other Ambulatory Visit (HOSPITAL_COMMUNITY): Payer: Self-pay

## 2021-08-24 ENCOUNTER — Other Ambulatory Visit (HOSPITAL_COMMUNITY): Payer: Self-pay

## 2021-08-24 ENCOUNTER — Other Ambulatory Visit: Payer: Self-pay | Admitting: Family Medicine

## 2021-08-24 DIAGNOSIS — R071 Chest pain on breathing: Secondary | ICD-10-CM

## 2021-08-24 DIAGNOSIS — R0602 Shortness of breath: Secondary | ICD-10-CM

## 2021-08-24 MED ORDER — ALBUTEROL SULFATE HFA 108 (90 BASE) MCG/ACT IN AERS
2.0000 | INHALATION_SPRAY | Freq: Three times a day (TID) | RESPIRATORY_TRACT | 0 refills | Status: DC
  Filled 2021-08-24: qty 18, 17d supply, fill #0

## 2021-09-01 ENCOUNTER — Other Ambulatory Visit (HOSPITAL_COMMUNITY): Payer: Self-pay

## 2021-09-04 ENCOUNTER — Emergency Department (INDEPENDENT_AMBULATORY_CARE_PROVIDER_SITE_OTHER)
Admission: EM | Admit: 2021-09-04 | Discharge: 2021-09-04 | Disposition: A | Discharge status: Home / Self Care | Payer: Medicaid Other | Source: Home / Self Care | Attending: Family Medicine | Admitting: Family Medicine

## 2021-09-04 ENCOUNTER — Other Ambulatory Visit (HOSPITAL_COMMUNITY): Payer: Self-pay

## 2021-09-04 DIAGNOSIS — J029 Acute pharyngitis, unspecified: Secondary | ICD-10-CM | POA: Diagnosis not present

## 2021-09-04 DIAGNOSIS — J209 Acute bronchitis, unspecified: Secondary | ICD-10-CM

## 2021-09-04 LAB — POCT RAPID STREP A (OFFICE): Rapid Strep A Screen: NEGATIVE

## 2021-09-04 MED ORDER — AZITHROMYCIN 250 MG PO TABS
ORAL_TABLET | ORAL | 0 refills | Status: AC
  Filled 2021-09-04: qty 6, 5d supply, fill #0

## 2021-09-04 NOTE — Discharge Instructions (Signed)
Drink lots of fluids Continue your over-the-counter cough and cold medicine Take the azithromycin as directed See your doctor if not improving by the end of the week

## 2021-09-04 NOTE — ED Provider Notes (Signed)
[facility]    CSN: [id number] Arrival date & time: 09/04/21  0909      History   Chief Complaint Chief Complaint  Patient presents with   Cough   Sore Throat    HPI Vershawn Westrup is a 21 y.o. male.   HPI  Patient's been sick for 3-4 days.  He has had cough, brown sputum, shortness of breath, sore throat, head pressure, headache  Past Medical History:  Diagnosis Date   Acne    Allergy    Anxiety    Asthma    Chronic headaches    Depression    GERD (gastroesophageal reflux disease)     Patient Active Problem List   Diagnosis Date Noted   Learning disability 06/06/2021   Frequent headaches 04/06/2021   Anxiety state 04/06/2021   Chest pain 03/22/2021   Polyarthritis with positive rheumatoid factor (HCC) 09/25/2020   Diarrhea 05/30/2020   Elevated TSH 05/30/2020   Nausea and vomiting 05/30/2020   Well adult exam 05/02/2020   Back pain of thoracolumbar region 12/15/2019   Patellofemoral arthralgia of left knee 12/15/2019   First degree ankle sprain, left, initial encounter 12/15/2019   Current mild episode of major depressive disorder without prior episode (HCC) 02/16/2019   Egg allergy 08/29/2016    Past Surgical History:  Procedure Laterality Date   OPEN REDUCTION INTERNAL FIXATION (ORIF) METACARPAL Right 11/19/2018   Procedure: Right small finger metacarpal open malunion repair;  Surgeon: Bradly Bienenstock, MD;  Location: [facility];  Service: Orthopedics;  Laterality: Right;  90 minutes   TYMPANOSTOMY TUBE PLACEMENT Bilateral    placed age 62 have fallen out   UPPER GASTROINTESTINAL ENDOSCOPY     WISDOM TOOTH EXTRACTION  04/04/2021       Home Medications    Prior to Admission medications   Medication Sig Start Date End Date Taking? Authorizing Provider  albuterol (VENTOLIN HFA) 108 (90 Base) MCG/ACT inhaler Inhale 2 puffs into the lungs 3 (three) times daily then 1 to 2 puffs every 4 hours as needed for shortness of breath  and bronchospasms. 08/24/21   Everrett Coombe, DO  azithromycin (ZITHROMAX Z-PAK) 250 MG tablet Take 2 tablets (500 mg total) by mouth daily for 1 day, THEN 1 tablet (250 mg total) daily for 4 days. 09/04/21 09/09/21 Yes Eustace Moore, MD  amitriptyline (ELAVIL) 25 MG tablet Take 1 tablet by mouth daily at bedtime. 08/02/21   Everrett Coombe, DO  escitalopram (LEXAPRO) 20 MG tablet Take 1 tablet (20 mg total) by mouth at bedtime. 06/06/21   Everrett Coombe, DO  meloxicam (MOBIC) 15 MG tablet Take 1 tablet (15 mg total) by mouth in the morning with a meal for 2 weeks then 1 tablet daily as needed for pain. (Take with food) 07/03/21   Monica Becton, MD  omeprazole (PRILOSEC) 40 MG capsule Take 1 capsule (40 mg total) by mouth in the morning and at bedtime. 03/28/21   Everrett Coombe, DO  ondansetron (ZOFRAN-ODT) 4 MG disintegrating tablet Take 1 tablet (4 mg total) by mouth every 8 (eight) hours as needed for nausea or vomiting. 07/03/21   Everrett Coombe, DO  SODIUM FLUORIDE 5000 ENAMEL 1.1-5 % GEL USE TO BRUSH TEETH TWICE DAILY 04/26/20   [provider]  Spacer/Aero-Holding Deretha [facility] DIAMOND) MISC  04/04/20   [provider]    Family History Family History  Problem Relation Age of Onset   Diabetes Father    Hyperlipidemia Father  Hypertension Father    COPD Father    Asthma Father    Colon cancer Neg Hx    Esophageal cancer Neg Hx    Rectal cancer Neg Hx    Stomach cancer Neg Hx     Social History Social History   Tobacco Use   Smoking status: Never   Smokeless tobacco: Never  Vaping Use   Vaping Use: Former  Substance Use Topics   Alcohol use: No    Alcohol/week: 0.0 standard drinks of alcohol   Drug use: No     Allergies   Eggs or egg-derived products and No healthtouch food allergies   Review of Systems Review of Systems  See HPI Physical Exam Triage Vital Signs ED Triage Vitals  Enc Vitals Group     BP 09/04/21 0936 132/80      Pulse Rate 09/04/21 0936 90     Resp 09/04/21 0936 14     Temp 09/04/21 0936 98.5 F (36.9 C)     Temp Source 09/04/21 0936 Oral     SpO2 09/04/21 0936 100 %     Weight 09/04/21 0938 168 lb (76.2 kg)     Height --      Head Circumference --      Peak Flow --      Pain Score 09/04/21 0937 6     Pain Loc --      Pain Edu? --      Excl. in GC? --    No data found.  Updated Vital Signs BP 132/80 (BP Location: Right Arm)   Pulse 90   Temp 98.5 F (36.9 C) (Oral)   Resp 14   Wt 76.2 kg   SpO2 100%   BMI 24.11 kg/m      Physical Exam Constitutional:      General: He is not in acute distress.    Appearance: He is well-developed and normal weight. He is not ill-appearing.  HENT:     Head: Normocephalic and atraumatic.     Right Ear: Tympanic membrane and ear canal normal.     Left Ear: Tympanic membrane and ear canal normal.     Nose: No congestion.     Mouth/Throat:     Mouth: Mucous membranes are moist.     Pharynx: Pharyngeal swelling and posterior oropharyngeal erythema present.     Tonsils: Tonsillar exudate present. 1+ on the right. 1+ on the left.  Eyes:     Conjunctiva/sclera: Conjunctivae normal.     Pupils: Pupils are equal, round, and reactive to light.  Cardiovascular:     Rate and Rhythm: Normal rate and regular rhythm.     Heart sounds: Normal heart sounds.  Pulmonary:     Effort: Pulmonary effort is normal. No respiratory distress.     Breath sounds: No stridor. No wheezing, rhonchi or rales.  Abdominal:     General: There is no distension.     Palpations: Abdomen is soft.  Musculoskeletal:        General: Normal range of motion.     Cervical back: Normal range of motion.  Lymphadenopathy:     Cervical: Cervical adenopathy present.  Skin:    General: Skin is warm and dry.  Neurological:     Mental Status: He is alert.      UC Treatments / Results  Labs (all labs ordered are listed, but only abnormal results are displayed) Labs Reviewed   CULTURE, GROUP A STREP  POCT RAPID STREP A (OFFICE)  EKG   Radiology No results found.  Procedures Procedures (including critical care time)  Medications Ordered in UC Medications - No data to display  Initial Impression / Assessment and Plan / UC Course  I have reviewed the triage vital signs and the nursing notes.  Pertinent labs & imaging results that were available during my care of the patient were reviewed by me and considered in my medical decision making (see chart for details).     Final Clinical Impressions(s) / UC Diagnoses   Final diagnoses:  Sore throat  Acute bronchitis, unspecified organism     Discharge Instructions      Drink lots of fluids Continue your over-the-counter cough and cold medicine Take the azithromycin as directed See your doctor if not improving by the end of the week   ED Prescriptions     Medication Sig Dispense Auth. Provider   azithromycin (ZITHROMAX Z-PAK) 250 MG tablet Take 2 tablets (500 mg total) by mouth daily for 1 day, THEN 1 tablet (250 mg total) daily for 4 days. 6 tablet Eustace Moore, MD      PDMP not reviewed this encounter.   Eustace Moore, MD 09/04/21 [phone]

## 2021-09-04 NOTE — ED Triage Notes (Signed)
Pt presents with c/o cough and sore throat x 3 days.

## 2021-09-06 ENCOUNTER — Ambulatory Visit (INDEPENDENT_AMBULATORY_CARE_PROVIDER_SITE_OTHER): Payer: Medicaid Other

## 2021-09-06 ENCOUNTER — Encounter: Payer: Self-pay | Admitting: Family Medicine

## 2021-09-06 ENCOUNTER — Ambulatory Visit (INDEPENDENT_AMBULATORY_CARE_PROVIDER_SITE_OTHER): Payer: Medicaid Other | Admitting: Family Medicine

## 2021-09-06 ENCOUNTER — Other Ambulatory Visit (HOSPITAL_COMMUNITY): Payer: Self-pay

## 2021-09-06 VITALS — BP 118/77 | HR 113 | Ht 70.0 in | Wt 167.0 lb

## 2021-09-06 DIAGNOSIS — M25562 Pain in left knee: Secondary | ICD-10-CM | POA: Diagnosis not present

## 2021-09-06 DIAGNOSIS — F411 Generalized anxiety disorder: Secondary | ICD-10-CM | POA: Diagnosis not present

## 2021-09-06 DIAGNOSIS — R519 Headache, unspecified: Secondary | ICD-10-CM

## 2021-09-06 DIAGNOSIS — J40 Bronchitis, not specified as acute or chronic: Secondary | ICD-10-CM | POA: Insufficient documentation

## 2021-09-06 MED ORDER — PREDNISONE 50 MG PO TABS
50.0000 mg | ORAL_TABLET | Freq: Every day | ORAL | 0 refills | Status: DC
  Filled 2021-09-06: qty 5, 5d supply, fill #0

## 2021-09-06 MED ORDER — RIZATRIPTAN BENZOATE 10 MG PO TBDP
10.0000 mg | ORAL_TABLET | ORAL | 0 refills | Status: DC | PRN
  Filled 2021-09-06: qty 10, 30d supply, fill #0

## 2021-09-06 NOTE — Assessment & Plan Note (Signed)
No significant swelling noted he does have some mild grinding and popping on McMurry test.  Recommend x-ray today.  Adding steroid for his bronchitis which hopefully will help his knee pain.  If pain continues we can consider MRI.

## 2021-09-06 NOTE — Patient Instructions (Addendum)
Start prednisone 50mg  daily x5 days.  Continue azithromycin and albuterol inhaler.   Try maxalt for headaches.    See me again in 3 months or sooner if needed.

## 2021-09-06 NOTE — Assessment & Plan Note (Signed)
Stable with Lexapro at current strength.  Recommend continuation.

## 2021-09-06 NOTE — Assessment & Plan Note (Signed)
Continue amitriptyline at current dose.  Adding Maxalt to use as needed for breakthrough symptoms.

## 2021-09-06 NOTE — Assessment & Plan Note (Signed)
Continue azithromycin.  Adding course of prednisone.  He may use albuterol as needed.

## 2021-09-06 NOTE — Progress Notes (Signed)
Christopher Forbes - 21 y.o. male MRN [id number]  Date of birth: [birth year]  Subjective No chief complaint on file.   HPI Christopher Forbes is a 21 year old male here today for follow-up visit.  Reports that he continues to have intermittent headaches.  Amitriptyline has helped some.  Reports he is not consistent about taking this.  He has not tried triptans in the past.  He would be interested in trying this for his acute headache episodes.  He was fairly stable with Lexapro.  Reports he is doing well with this.  No side effects at this time.  Recently seen in urgent care and told he had bronchitis.  He has noticed some wheezing and mild dyspnea.  He has been taking azithromycin that was prescribed for him.  He also is using albuterol inhaler.  He is also having knee pain.  Reports he was at a park recently and twisted his knee.  He noticed any swelling of the knee.  Pain worse with walking and weightbearing.  ROS:  A comprehensive ROS was completed and negative except as noted per HPI  Allergies  Allergen Reactions   Eggs Or Egg-Derived Products Nausea And Vomiting   No Healthtouch Food Allergies Nausea And Vomiting    Chicken, Milk    Past Medical History:  Diagnosis Date   Acne    Allergy    Anxiety    Asthma    Chronic headaches    Depression    GERD (gastroesophageal reflux disease)     Past Surgical History:  Procedure Laterality Date   OPEN REDUCTION INTERNAL FIXATION (ORIF) METACARPAL Right 11/19/2018   Procedure: Right small finger metacarpal open malunion repair;  Surgeon: Bradly Bienenstock, MD;  Location: [facility];  Service: Orthopedics;  Laterality: Right;  90 minutes   TYMPANOSTOMY TUBE PLACEMENT Bilateral    placed age 75 have fallen out   UPPER GASTROINTESTINAL ENDOSCOPY     WISDOM TOOTH EXTRACTION  04/04/2021    Social History   Socioeconomic History   Marital status: Single    Spouse name: Not on file   Number of children: Not on file    Years of education: Not on file   Highest education level: Not on file  Occupational History   Occupation: student  Tobacco Use   Smoking status: Never   Smokeless tobacco: Never  Vaping Use   Vaping Use: Former  Substance and Sexual Activity   Alcohol use: No    Alcohol/week: 0.0 standard drinks of alcohol   Drug use: No   Sexual activity: Yes    Partners: Male  Other Topics Concern   Not on file  Social History Narrative   Patient lives with brother step sister and step dad Transport planner. Mom is involved.   Social Determinants of Health   Financial Resource Strain: Not on file  Food Insecurity: Not on file  Transportation Needs: Not on file  Physical Activity: Not on file  Stress: Not on file  Social Connections: Not on file    Family History  Problem Relation Age of Onset   Diabetes Father    Hyperlipidemia Father    Hypertension Father    COPD Father    Asthma Father    Colon cancer Neg Hx    Esophageal cancer Neg Hx    Rectal cancer Neg Hx    Stomach cancer Neg Hx     Health Maintenance  Topic Date Due   COVID-19 Vaccine (3 - Pfizer series) 11/17/2021 (  Originally 02/04/2020)   Hepatitis C Screening  08/10/2022 (Originally 12/28/2019)   HIV Screening  08/10/2022 (Originally 12/27/2016)   TETANUS/TDAP  01/08/2022   HPV VACCINES  Completed     ----------------------------------------------------------------------------------------------------------------------------------------------------------------------------------------------------------------- Physical Exam BP 118/77 (BP Location: Right Arm, Patient Position: Sitting, Cuff Size: Normal)   Pulse (!) 113   Ht 5\' 10"  (1.778 m)   Wt 167 lb (75.8 kg)   SpO2 99%   BMI 23.96 kg/m   Physical Exam Constitutional:      Appearance: Normal appearance.  Eyes:     General: No scleral icterus. Cardiovascular:     Rate and Rhythm: Normal rate and regular rhythm.  Pulmonary:     Effort: Pulmonary effort is  normal.     Breath sounds: Normal breath sounds.  Musculoskeletal:     Cervical back: Neck supple.  Skin:    Comments: Left knee is normal to inspection and palpation.  No effusion noted.  He does have some pain with flexion.  Does have some grinding and popping with McMurray along the medial side along with some pain.  Neurological:     Mental Status: He is alert.  Psychiatric:        Mood and Affect: Mood normal.        Behavior: Behavior normal.     ------------------------------------------------------------------------------------------------------------------------------------------------------------------------------------------------------------------- Assessment and Plan  Acute pain of left knee No significant swelling noted he does have some mild grinding and popping on McMurry test.  Recommend x-ray today.  Adding steroid for his bronchitis which hopefully will help his knee pain.  If pain continues we can consider MRI.  Frequent headaches Continue amitriptyline at current dose.  Adding Maxalt to use as needed for breakthrough symptoms.  Anxiety state Stable with Lexapro at current strength.  Recommend continuation.  Bronchitis Continue azithromycin.  Adding course of prednisone.  He may use albuterol as needed.   Meds ordered this encounter  Medications   rizatriptan (MAXALT-MLT) 10 MG disintegrating tablet    Sig: Take 1 tablet (10 mg total) by mouth as needed for migraine. May repeat in 2 hours if needed    Dispense:  10 tablet    Refill:  0   predniSONE (DELTASONE) 50 MG tablet    Sig: Take 1 tablet (50 mg total) by mouth daily for 5 days    Dispense:  5 tablet    Refill:  0    Return in about 3 months (around 12/07/2021) for Headaches.    This visit occurred during the SARS-CoV-2 public health emergency.  Safety protocols were in place, including screening questions prior to the visit, additional usage of staff PPE, and extensive cleaning of exam room  while observing appropriate contact time as indicated for disinfecting solutions.

## 2021-09-07 LAB — CULTURE, GROUP A STREP: Strep A Culture: NEGATIVE

## 2021-09-22 ENCOUNTER — Ambulatory Visit (INDEPENDENT_AMBULATORY_CARE_PROVIDER_SITE_OTHER): Payer: Medicaid Other | Admitting: Sports Medicine

## 2021-09-22 ENCOUNTER — Other Ambulatory Visit: Payer: Self-pay | Admitting: Family Medicine

## 2021-09-22 ENCOUNTER — Other Ambulatory Visit (HOSPITAL_COMMUNITY): Payer: Self-pay

## 2021-09-22 DIAGNOSIS — R071 Chest pain on breathing: Secondary | ICD-10-CM

## 2021-09-22 DIAGNOSIS — R0602 Shortness of breath: Secondary | ICD-10-CM

## 2021-09-22 DIAGNOSIS — M25562 Pain in left knee: Secondary | ICD-10-CM

## 2021-09-22 MED ORDER — DICLOFENAC SODIUM 75 MG PO TBEC
75.0000 mg | DELAYED_RELEASE_TABLET | Freq: Two times a day (BID) | ORAL | 3 refills | Status: DC
  Filled 2021-09-22: qty 60, 30d supply, fill #0
  Filled 2021-11-18: qty 60, 30d supply, fill #1
  Filled 2021-11-27: qty 60, 30d supply, fill #0

## 2021-09-22 NOTE — Progress Notes (Signed)
    Procedures performed today:    None.  Independent interpretation of notes and tests performed by another provider:   None.  Brief History, Exam, Impression, and Recommendations:    Patellofemoral arthralgia of left knee Pleasant 21 year old male, about a month ago he twisted his knee a couple of times. Had immediate pain medial joint line. No swelling, he saw Dr. Ashley Royalty, x-rays were negative for fracture. On exam today he continues to have pain mostly at the medial joint line, minimal tenderness. No effusion, good motion, good strength, all ligaments are stable, negative McMurray's sign, no pain with terminal flexion. I think he just has a capsular strain. We will switch him from meloxicam to Voltaren as he has been on meloxicam for a long time. We will get him a equal hinged knee brace. Return to see me in about 4-6 weeks, MRI if no better.    ____________________________________________ Ihor Austin. Benjamin Stain, M.D., ABFM., CAQSM., AME. Primary Care and Sports Medicine [facility]  Adjunct Professor of Family Medicine  [employer]  Restaurant manager, fast food

## 2021-09-22 NOTE — Assessment & Plan Note (Signed)
Pleasant 21 year old male, about a month ago he twisted his knee a couple of times. Had immediate pain medial joint line. No swelling, he saw Dr. Ashley Royalty, x-rays were negative for fracture. On exam today he continues to have pain mostly at the medial joint line, minimal tenderness. No effusion, good motion, good strength, all ligaments are stable, negative McMurray's sign, no pain with terminal flexion. I think he just has a capsular strain. We will switch him from meloxicam to Voltaren as he has been on meloxicam for a long time. We will get him a equal hinged knee brace. Return to see me in about 4-6 weeks, MRI if no better.

## 2021-09-25 ENCOUNTER — Other Ambulatory Visit (HOSPITAL_COMMUNITY): Payer: Self-pay

## 2021-09-25 MED ORDER — RIZATRIPTAN BENZOATE 10 MG PO TBDP
10.0000 mg | ORAL_TABLET | ORAL | 0 refills | Status: DC | PRN
Start: 2021-09-25 — End: 2021-11-18
  Filled 2021-09-25 – 2021-10-02 (×2): qty 10, 30d supply, fill #0

## 2021-09-25 MED ORDER — ALBUTEROL SULFATE HFA 108 (90 BASE) MCG/ACT IN AERS
2.0000 | INHALATION_SPRAY | Freq: Three times a day (TID) | RESPIRATORY_TRACT | 0 refills | Status: DC
  Filled 2021-09-25: qty 18, 17d supply, fill #0

## 2021-09-26 ENCOUNTER — Other Ambulatory Visit (HOSPITAL_COMMUNITY): Payer: Self-pay

## 2021-09-27 ENCOUNTER — Other Ambulatory Visit (HOSPITAL_COMMUNITY): Payer: Self-pay

## 2021-10-02 ENCOUNTER — Other Ambulatory Visit (HOSPITAL_COMMUNITY): Payer: Self-pay

## 2021-11-18 ENCOUNTER — Other Ambulatory Visit: Payer: Self-pay | Admitting: Family Medicine

## 2021-11-18 DIAGNOSIS — R0602 Shortness of breath: Secondary | ICD-10-CM

## 2021-11-18 DIAGNOSIS — F320 Major depressive disorder, single episode, mild: Secondary | ICD-10-CM

## 2021-11-18 DIAGNOSIS — R071 Chest pain on breathing: Secondary | ICD-10-CM

## 2021-11-21 ENCOUNTER — Other Ambulatory Visit (HOSPITAL_COMMUNITY): Payer: Self-pay

## 2021-11-23 ENCOUNTER — Other Ambulatory Visit (HOSPITAL_COMMUNITY): Payer: Self-pay

## 2021-11-23 MED ORDER — ONDANSETRON 4 MG PO TBDP
4.0000 mg | ORAL_TABLET | Freq: Three times a day (TID) | ORAL | 0 refills | Status: DC | PRN
  Filled 2021-11-23 – 2021-11-27 (×2): qty 20, 7d supply, fill #0

## 2021-11-23 MED ORDER — ESCITALOPRAM OXALATE 20 MG PO TABS
20.0000 mg | ORAL_TABLET | Freq: Every day | ORAL | 1 refills | Status: DC
  Filled 2021-11-23 – 2021-11-27 (×2): qty 90, 90d supply, fill #0

## 2021-11-23 MED ORDER — AMITRIPTYLINE HCL 25 MG PO TABS
25.0000 mg | ORAL_TABLET | Freq: Every day | ORAL | 1 refills | Status: DC
  Filled 2021-11-23 – 2021-11-27 (×2): qty 30, 30d supply, fill #0

## 2021-11-23 MED ORDER — OMEPRAZOLE 40 MG PO CPDR
40.0000 mg | DELAYED_RELEASE_CAPSULE | Freq: Two times a day (BID) | ORAL | 3 refills | Status: DC
  Filled 2021-11-23 – 2021-11-27 (×2): qty 60, 30d supply, fill #0

## 2021-11-23 MED ORDER — RIZATRIPTAN BENZOATE 10 MG PO TBDP
10.0000 mg | ORAL_TABLET | ORAL | 0 refills | Status: DC | PRN
  Filled 2021-11-23 – 2021-11-27 (×2): qty 10, 30d supply, fill #0

## 2021-11-23 MED ORDER — ALBUTEROL SULFATE HFA 108 (90 BASE) MCG/ACT IN AERS
2.0000 | INHALATION_SPRAY | Freq: Three times a day (TID) | RESPIRATORY_TRACT | 0 refills | Status: DC
  Filled 2021-11-23 – 2021-11-27 (×2): qty 18, 17d supply, fill #0

## 2021-11-27 ENCOUNTER — Other Ambulatory Visit (HOSPITAL_COMMUNITY): Payer: Self-pay

## 2022-01-08 ENCOUNTER — Other Ambulatory Visit (HOSPITAL_COMMUNITY): Payer: Self-pay

## 2022-03-28 ENCOUNTER — Other Ambulatory Visit: Payer: Self-pay | Admitting: Family Medicine

## 2022-03-28 ENCOUNTER — Encounter (HOSPITAL_COMMUNITY): Payer: Self-pay

## 2022-03-28 ENCOUNTER — Other Ambulatory Visit (HOSPITAL_COMMUNITY): Payer: Self-pay

## 2022-03-28 DIAGNOSIS — R071 Chest pain on breathing: Secondary | ICD-10-CM

## 2022-03-28 DIAGNOSIS — R0602 Shortness of breath: Secondary | ICD-10-CM

## 2022-03-28 MED ORDER — ALBUTEROL SULFATE HFA 108 (90 BASE) MCG/ACT IN AERS
2.0000 | INHALATION_SPRAY | Freq: Three times a day (TID) | RESPIRATORY_TRACT | 0 refills | Status: DC
  Filled 2022-03-28: qty 18, 17d supply, fill #0

## 2022-04-02 ENCOUNTER — Other Ambulatory Visit (HOSPITAL_COMMUNITY): Payer: Self-pay

## 2022-04-03 ENCOUNTER — Other Ambulatory Visit: Payer: Self-pay

## 2022-04-03 ENCOUNTER — Other Ambulatory Visit (HOSPITAL_COMMUNITY): Payer: Self-pay

## 2022-04-20 IMAGING — CT CT RENAL STONE PROTOCOL
2 of 4 series · 15 of 46 positions shown, 17 images · non-contrast
Comparison: None.

CLINICAL DATA: Right flank pain radiating to right abdomen for 1
week, history of urolithiasis

EXAM:
CT ABDOMEN AND PELVIS WITHOUT CONTRAST
TECHNIQUE: Multidetector CT imaging of the abdomen and pelvis was performed
following the standard protocol without IV contrast.

[Series 2: axial st · axial · 0.77mm/px · z∈[-576,-106]mm · 12 of 108 slices shown, 14 images]
[im 9/108  soft-tissue]
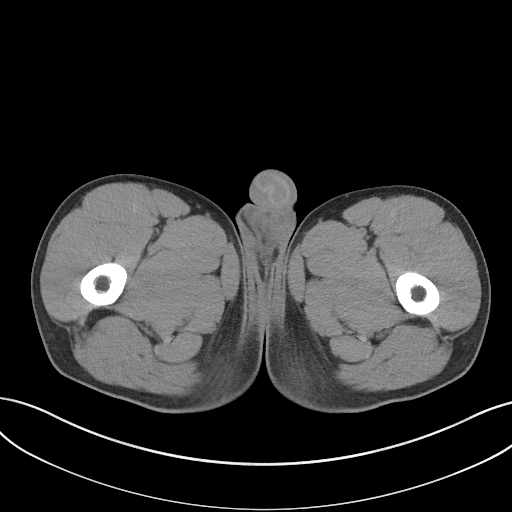
[im 9/108  bone]
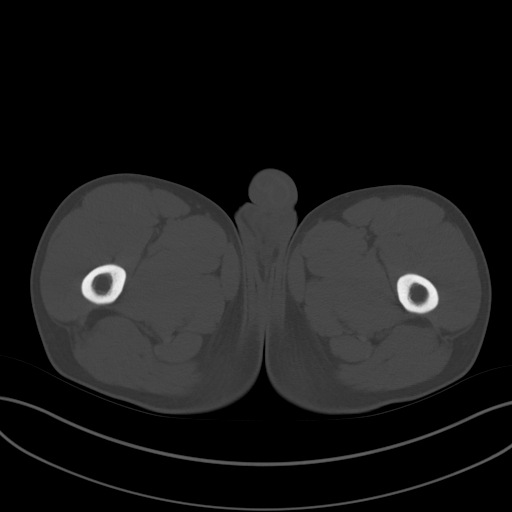
[im 18/108  soft-tissue]
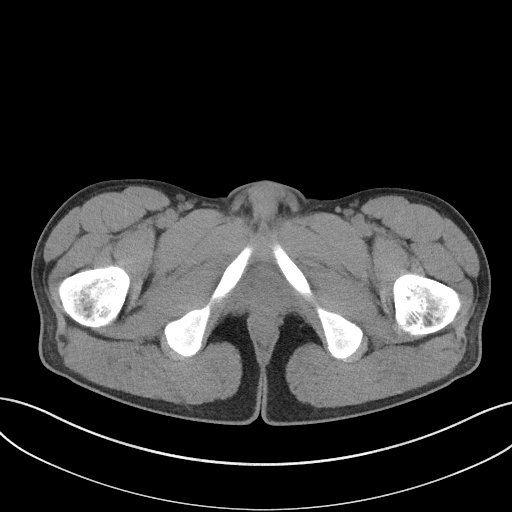
[im 26/108  soft-tissue]
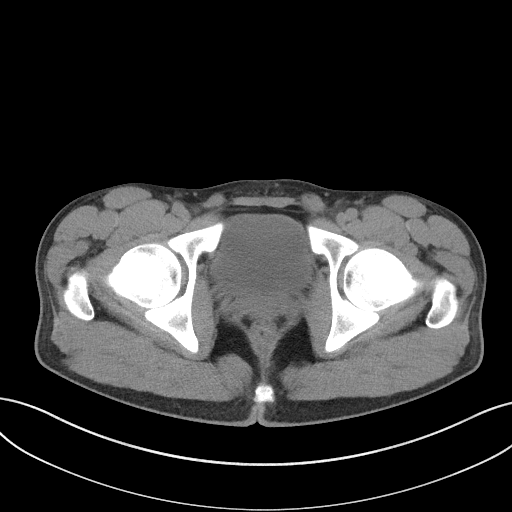
[im 35/108  soft-tissue]
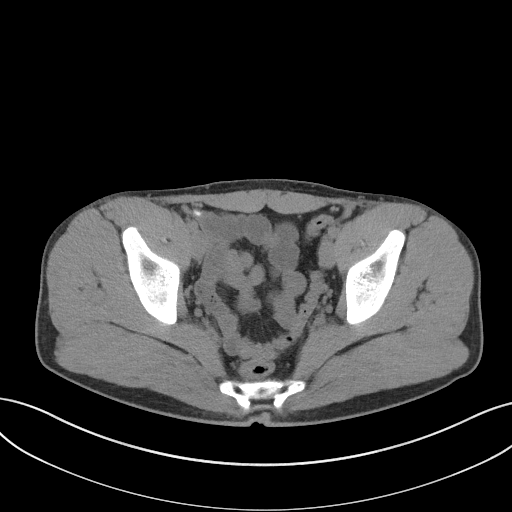
[im 43/108  soft-tissue]
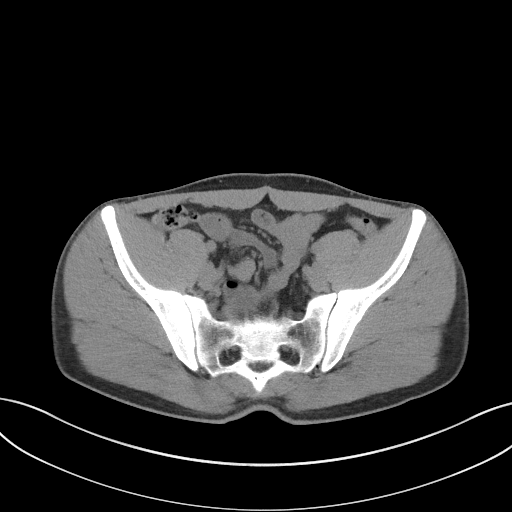
[im 52/108  soft-tissue]
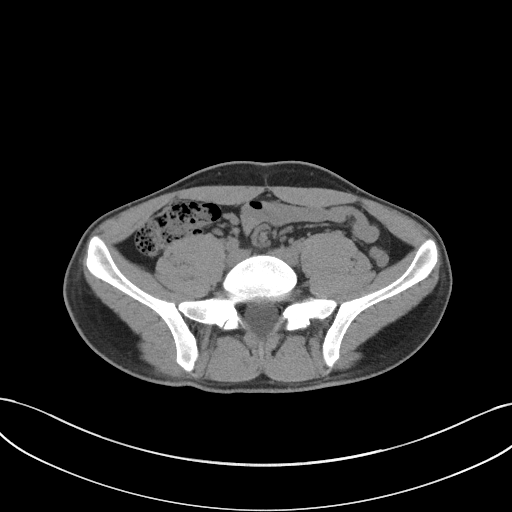
[im 60/108  soft-tissue]
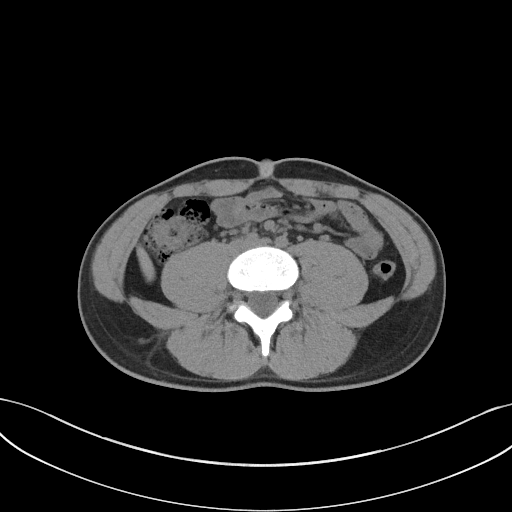
[im 69/108  soft-tissue]
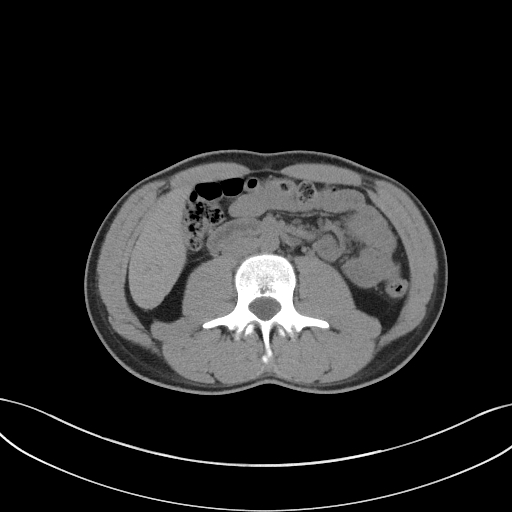
[im 78/108  soft-tissue]
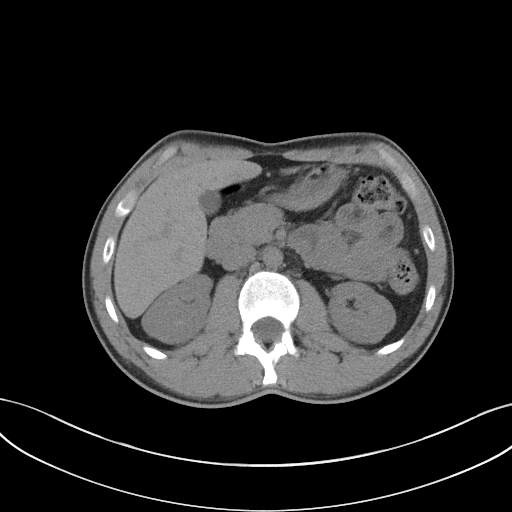
[im 78/108  bone]
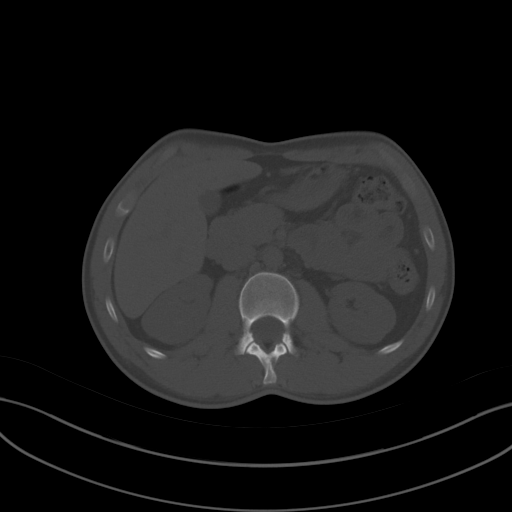
[im 86/108  soft-tissue]
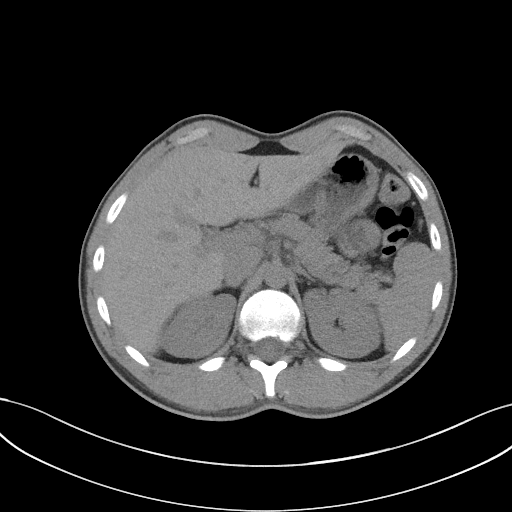
[im 95/108  soft-tissue]
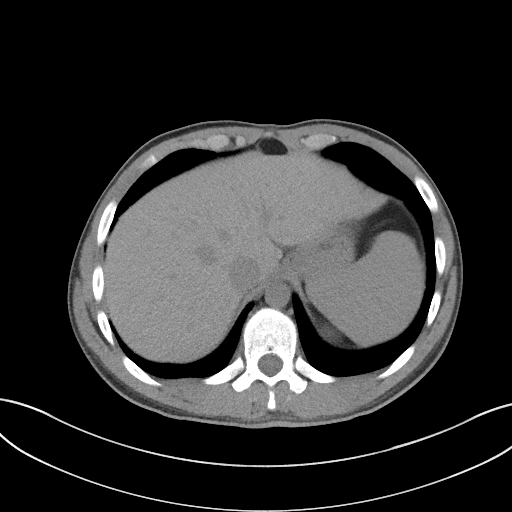
[im 103/108  soft-tissue]
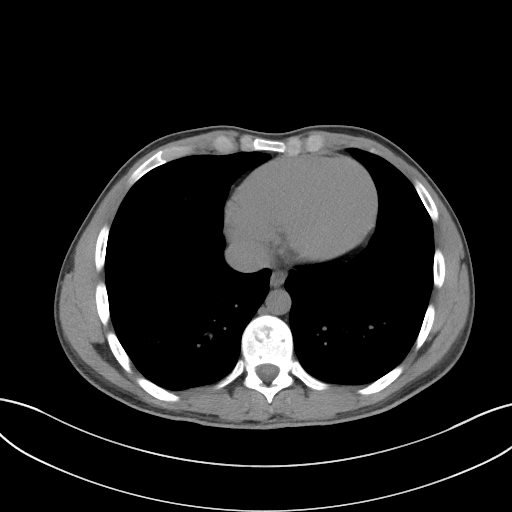

[Series 5: coronal st · coronal · 0.74mm/px · 3 of 80 slices shown]
[im 27/80  soft-tissue]
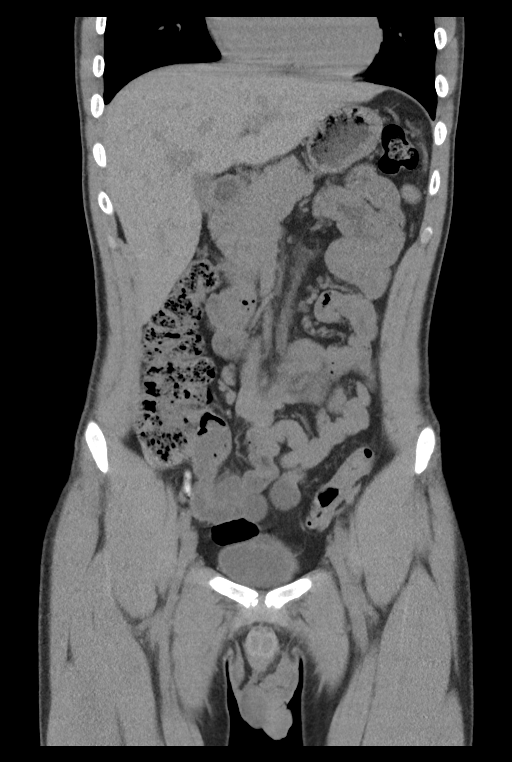
[im 36/80  soft-tissue]
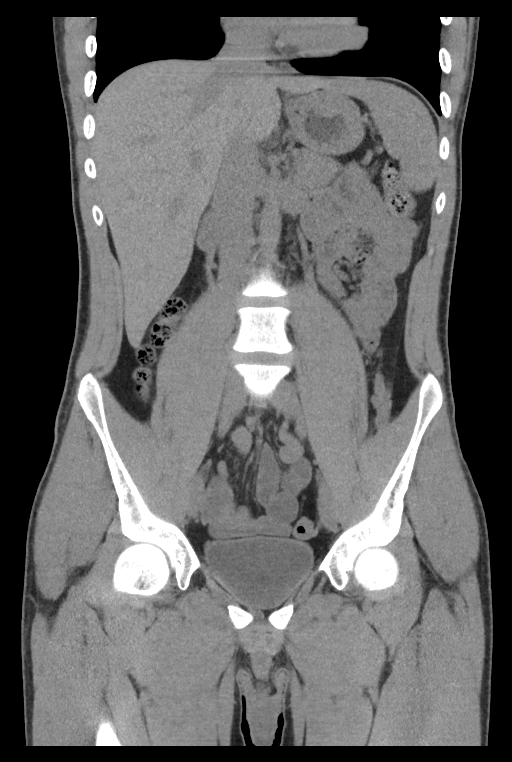
[im 44/80  soft-tissue]
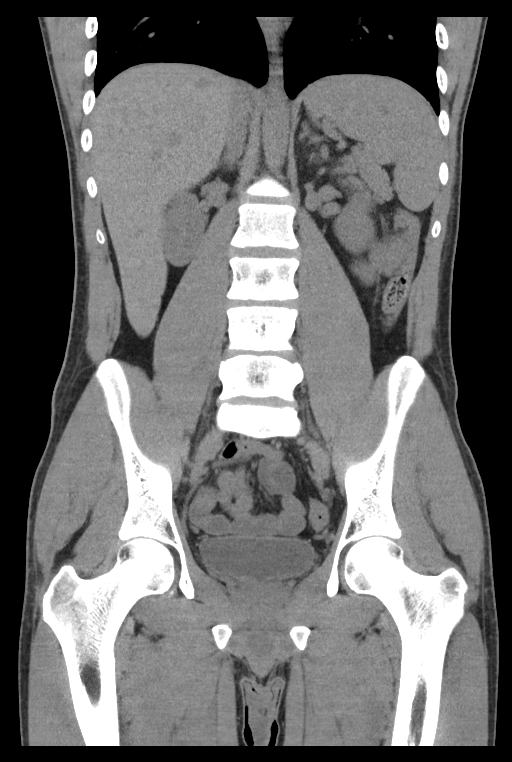

[15 of 46 positions shown; findings below may reference images not displayed]

FINDINGS: Lower chest: Lung bases are clear. Normal heart size. No pericardial
effusion.

Hepatobiliary: No visible concerning liver lesion on this unenhanced
CT. Punctate benign capsular calcifications seen along the anterior
right lobe ([DATE]) and posterior right lobe (2/32). Normal liver
attenuation. Smooth liver surface contour. Normal gallbladder and
biliary tree.

Pancreas: No pancreatic ductal dilatation or surrounding
inflammatory changes.

Spleen: Normal in size. No concerning splenic lesions.

Adrenals/Urinary Tract: Normal adrenal glands.

Stomach/Bowel: Distal esophagus, stomach and duodenal sweep are
unremarkable. No small bowel wall thickening or dilatation. No
evidence of obstruction. Appendix extending from the cecal tip
contains some hyper dense inspissated material versus fecaliths. No
periappendiceal inflammation or significant dilatation is seen
however. No colonic dilatation or wall thickening.

Vascular/Lymphatic: No significant vascular findings are present. No
enlarged abdominal or pelvic lymph nodes.

Reproductive: Prostate is unremarkable.

Other: No abdominal wall hernia or abnormality. No abdominopelvic
ascites.

Musculoskeletal: No acute or significant osseous findings.
Musculature is normal and symmetric.
IMPRESSION: 1. No urolithiasis or hydronephrosis.
2. Appendix contains some hyper dense inspissated material versus
fecaliths without periappendiceal inflammation or significant
dilatation to suggest acute appendicitis.
3. No other acute intra-abdominal process to provide cause for
patient's symptoms.

## 2022-07-12 ENCOUNTER — Other Ambulatory Visit (HOSPITAL_COMMUNITY): Payer: Self-pay

## 2022-07-12 ENCOUNTER — Ambulatory Visit (INDEPENDENT_AMBULATORY_CARE_PROVIDER_SITE_OTHER): Payer: Medicaid Other | Admitting: Sports Medicine

## 2022-07-12 DIAGNOSIS — M25562 Pain in left knee: Secondary | ICD-10-CM | POA: Diagnosis not present

## 2022-07-12 NOTE — Progress Notes (Addendum)
    Procedures performed today:    None.  Independent interpretation of notes and tests performed by another provider:   None.  Brief History, Exam, Impression, and Recommendations:    Patellofemoral arthralgia of left knee This is a pleasant 22 year old male, we saw him last year in the summertime for left knee pain, he had some anterior knee pain, x-rays were negative at the time, we added Voltaren and gave him some home physical therapy, he did this consistently 2-3 times a week, principally patellofemoral type conditioning, vastus medialis. Specifically, and for medicaid coverage purposes, he has done home physical therapy consistently for over the past year so this meets the 4-week within the last 6 months criteria, he has done it 2-3 time weekly with 3 sets daily of vastus medialis strengthening, patellar mobilization, hip abductor strengthening, straight leg raises. We also got him a hinged knee brace. He did not improve. He was incarcerated for a period of time and had persistent symptoms and worsening symptoms, he also had an injury where he impacted the front of his left knee. Today he continues to have pain, he has some swelling anteriorly, some patellofemoral crepitus, the rest of the exam is normal. Due to the persistence of symptoms, failure of conservative treatment for greater than 6 weeks we will proceed with MRI. I do suspect if we see patellofemoral fissuring or chondral defect we will do an injection.    ____________________________________________ Ihor Austin. Benjamin Stain, M.D., ABFM., CAQSM., AME. Primary Care and Sports Medicine [facility]  Adjunct Professor of Family Medicine  [employer]  Restaurant manager, fast food

## 2022-07-12 NOTE — Assessment & Plan Note (Addendum)
This is a pleasant 22 year old male, we saw him last year in the summertime for left knee pain, he had some anterior knee pain, x-rays were negative at the time, we added Voltaren and gave him some home physical therapy, he did this consistently 2-3 times a week, principally patellofemoral type conditioning, vastus medialis. Specifically, and for medicaid coverage purposes, he has done home physical therapy consistently for over the past year so this meets the 4-week within the last 6 months criteria, he has done it 2-3 time weekly with 3 sets daily of vastus medialis strengthening, patellar mobilization, hip abductor strengthening, straight leg raises. We also got him a hinged knee brace. He did not improve. He was incarcerated for a period of time and had persistent symptoms and worsening symptoms, he also had an injury where he impacted the front of his left knee. Today he continues to have pain, he has some swelling anteriorly, some patellofemoral crepitus, the rest of the exam is normal. Due to the persistence of symptoms, failure of conservative treatment for greater than 6 weeks we will proceed with MRI. I do suspect if we see patellofemoral fissuring or chondral defect we will do an injection.

## 2022-08-03 ENCOUNTER — Telehealth: Payer: Self-pay

## 2022-08-03 NOTE — Telephone Encounter (Signed)
MRI for Left knee auth denied from insurance. Per insurance:  Needs information or doctor's notes that say you did exercises (physical therapy, chiropractic treatments, or medically directed home exercise program) for four weeks in the last six months.  We also need to know the number of visits you went to. We need to know the dates you did those exercises and that you did not get better.  Provider can appeal at [phone]. Case/Ref # [id number].

## 2022-08-03 NOTE — Telephone Encounter (Signed)
Spoke to [employer] doctor, he stated we needed specifically what exercises he performed, and needed to be specifically stated that he did it for at least 4 weeks over the past 6 months, how frequently per week and per day, I included all of this and faxed the note to the below fax number with the below tracking number for reconsideration.  Reference number for call:  #[id number]. Tracking # [id number]  F: [phone]

## 2022-08-04 ENCOUNTER — Other Ambulatory Visit: Payer: Self-pay | Admitting: Family Medicine

## 2022-08-06 NOTE — Telephone Encounter (Signed)
Patient scheduled for 08/14/2022, thanks.

## 2022-08-06 NOTE — Telephone Encounter (Signed)
Pls schedule pt with Dr. Ashley Royalty for headache. Last med refill 11/2021. Last seen 11/2021. Unable to refill medication. Thx

## 2022-08-08 NOTE — Telephone Encounter (Signed)
Task completed. MRI auth appeal approved by the insurance. Imaging dept was notified to contact the patient for scheduling.

## 2022-08-09 ENCOUNTER — Ambulatory Visit: Payer: Medicaid Other | Admitting: Family Medicine

## 2022-08-14 ENCOUNTER — Encounter: Payer: Self-pay | Admitting: Family Medicine

## 2022-08-14 ENCOUNTER — Ambulatory Visit (INDEPENDENT_AMBULATORY_CARE_PROVIDER_SITE_OTHER): Payer: Medicaid Other | Admitting: Family Medicine

## 2022-08-14 VITALS — BP 113/73 | HR 78 | Ht 70.0 in | Wt 178.0 lb

## 2022-08-14 DIAGNOSIS — F411 Generalized anxiety disorder: Secondary | ICD-10-CM | POA: Diagnosis not present

## 2022-08-14 DIAGNOSIS — R519 Headache, unspecified: Secondary | ICD-10-CM

## 2022-08-14 MED ORDER — TOPIRAMATE 50 MG PO TABS
ORAL_TABLET | ORAL | 1 refills | Status: DC
Start: 2022-08-14 — End: 2022-10-08

## 2022-08-14 MED ORDER — RIZATRIPTAN BENZOATE 10 MG PO TBDP: 10.0000 mg | ORAL_TABLET | ORAL | 0 refills | Status: DC | PRN

## 2022-08-15 ENCOUNTER — Other Ambulatory Visit: Payer: Self-pay | Admitting: Family Medicine

## 2022-08-16 ENCOUNTER — Encounter: Payer: Self-pay | Admitting: Family Medicine

## 2022-08-16 MED ORDER — ONDANSETRON 4 MG PO TBDP: 4.0000 mg | ORAL_TABLET | Freq: Three times a day (TID) | ORAL | 0 refills | Status: DC | PRN

## 2022-08-19 NOTE — Assessment & Plan Note (Signed)
He continues to have frequent headaches.  He has not tried topiramate before and I think this is worth trying prevention.  Will increase this over the next few weeks.  Continue Maxalt as needed.

## 2022-08-19 NOTE — Progress Notes (Signed)
Christopher Forbes - 22 y.o. male MRN [id number]  Date of birth: [birth year]  Subjective Chief Complaint  Patient presents with   Headache    HPI Christopher Forbes is a 22 year old male here today for follow-up visit.  He reports he is needing to have medications renewed.  Recently got out of prison and has not had his regular medications.  He does have an upcoming visit with psychiatry as well.  Does report having increased migraines since his release from prison.  Has been on Maxalt but did not feel like this was working as well as it used to.  He is having a few headaches each week.  He has not tried topiramate in the past.  He was on amitriptyline for sleep as well as migraine prevention in the past.  ROS:  A comprehensive ROS was completed and negative except as noted per HPI  Allergies  Allergen Reactions   Egg-Derived Products Nausea And Vomiting   No Healthtouch Food Allergies Nausea And Vomiting    Chicken, Milk    Past Medical History:  Diagnosis Date   Acne    Allergy    Anxiety    Asthma    Chronic headaches    Depression    GERD (gastroesophageal reflux disease)     Past Surgical History:  Procedure Laterality Date   OPEN REDUCTION INTERNAL FIXATION (ORIF) METACARPAL Right 11/19/2018   Procedure: Right small finger metacarpal open malunion repair;  Surgeon: Bradly Bienenstock, MD;  Location: [facility];  Service: Orthopedics;  Laterality: Right;  90 minutes   TYMPANOSTOMY TUBE PLACEMENT Bilateral    placed age 42 have fallen out   UPPER GASTROINTESTINAL ENDOSCOPY     WISDOM TOOTH EXTRACTION  04/04/2021    Social History   Socioeconomic History   Marital status: Single    Spouse name: Not on file   Number of children: Not on file   Years of education: Not on file   Highest education level: Not on file  Occupational History   Occupation: student  Tobacco Use   Smoking status: Never   Smokeless tobacco: Never  Vaping Use   Vaping Use: Former   Substance and Sexual Activity   Alcohol use: No    Alcohol/week: 0.0 standard drinks of alcohol   Drug use: No   Sexual activity: Yes    Partners: Male  Other Topics Concern   Not on file  Social History Narrative   Patient lives with brother step sister and step dad Transport planner. Mom is involved.   Social Determinants of Health   Financial Resource Strain: High Risk (08/13/2022)   Overall Financial Resource Strain (CARDIA)    Difficulty of Paying Living Expenses: Very hard  Food Insecurity: Food Insecurity Present (08/13/2022)   Hunger Vital Sign    Worried About Running Out of Food in the Last Year: Sometimes true    Ran Out of Food in the Last Year: Sometimes true  Transportation Needs: No Transportation Needs (08/13/2022)   PRAPARE - Administrator, Civil Service (Medical): No    Lack of Transportation (Non-Medical): No  Physical Activity: Insufficiently Active (08/13/2022)   Exercise Vital Sign    Days of Exercise per Week: 2 days    Minutes of Exercise per Session: 20 min  Stress: Stress Concern Present (08/13/2022)   [employer] of Occupational Health - Occupational Stress Questionnaire    Feeling of Stress : To some extent  Social Connections: Unknown (08/13/2022)  Social Advertising account executive [NHANES]    Frequency of Communication with Friends and Family: Never    Frequency of Social Gatherings with Friends and Family: Patient declined    Attends Religious Services: Patient declined    Database administrator or Organizations: No    Attends Engineer, structural: Not on file    Marital Status: Never married    Family History  Problem Relation Age of Onset   Diabetes Father    Hyperlipidemia Father    Hypertension Father    COPD Father    Asthma Father    Colon cancer Neg Hx    Esophageal cancer Neg Hx    Rectal cancer Neg Hx    Stomach cancer Neg Hx     Health Maintenance  Topic Date Due   HIV Screening  Never done    Hepatitis C Screening  Never done   COVID-19 Vaccine (3 - 2023-24 season) 11/17/2021   DTaP/Tdap/Td (7 - Td or Tdap) 01/08/2022   HPV VACCINES  Completed     ----------------------------------------------------------------------------------------------------------------------------------------------------------------------------------------------------------------- Physical Exam BP 113/73 (BP Location: Right Arm, Patient Position: Sitting, Cuff Size: Normal)   Pulse 78   Ht 5\' 10"  (1.778 m)   Wt 178 lb (80.7 kg)   SpO2 99%   BMI 25.54 kg/m   Physical Exam Constitutional:      Appearance: He is well-developed.  HENT:     Head: Normocephalic and atraumatic.  Cardiovascular:     Rate and Rhythm: Normal rate and regular rhythm.  Pulmonary:     Effort: Pulmonary effort is normal.     Breath sounds: Normal breath sounds.  Neurological:     General: No focal deficit present.     Mental Status: He is alert.     ------------------------------------------------------------------------------------------------------------------------------------------------------------------------------------------------------------------- Assessment and Plan  Anxiety state Followed by psychiatry.  Has upcoming visit  Frequent headaches He continues to have frequent headaches.  He has not tried topiramate before and I think this is worth trying prevention.  Will increase this over the next few weeks.  Continue Maxalt as needed.   Meds ordered this encounter  Medications   topiramate (TOPAMAX) 50 MG tablet    Sig: Take 25mg  daily x1 week then increase to 50mg  daily x1, then increase to 50mg  BID x1 week    Dispense:  60 tablet    Refill:  1   rizatriptan (MAXALT-MLT) 10 MG disintegrating tablet    Sig: Take 1 tablet (10 mg total) by mouth as needed for migraine. May repeat in 2 hours if needed    Dispense:  10 tablet    Refill:  0    Return in about 8 weeks (around 10/09/2022) for F/u  Headaches.    This visit occurred during the SARS-CoV-2 public health emergency.  Safety protocols were in place, including screening questions prior to the visit, additional usage of staff PPE, and extensive cleaning of exam room while observing appropriate contact time as indicated for disinfecting solutions.

## 2022-08-19 NOTE — Assessment & Plan Note (Signed)
Followed by psychiatry.  Has upcoming visit

## 2022-08-22 ENCOUNTER — Ambulatory Visit (INDEPENDENT_AMBULATORY_CARE_PROVIDER_SITE_OTHER): Payer: Medicaid Other | Admitting: Family Medicine

## 2022-08-22 ENCOUNTER — Encounter: Payer: Self-pay | Admitting: Family Medicine

## 2022-08-22 VITALS — BP 113/73 | HR 86 | Ht 70.0 in | Wt 176.0 lb

## 2022-08-22 DIAGNOSIS — G43809 Other migraine, not intractable, without status migrainosus: Secondary | ICD-10-CM | POA: Diagnosis not present

## 2022-08-22 DIAGNOSIS — G43909 Migraine, unspecified, not intractable, without status migrainosus: Secondary | ICD-10-CM | POA: Insufficient documentation

## 2022-08-22 MED ORDER — KETOROLAC TROMETHAMINE 30 MG/ML IJ SOLN
30.0000 mg | Freq: Once | INTRAMUSCULAR | Status: AC
Start: 2022-08-22 — End: 2022-08-22
  Administered 2022-08-22: 30 mg via INTRAMUSCULAR

## 2022-08-22 MED ORDER — DEXAMETHASONE SODIUM PHOSPHATE 10 MG/ML IJ SOLN
10.0000 mg | Freq: Once | INTRAMUSCULAR | Status: AC
Start: 2022-08-22 — End: 2022-08-22
  Administered 2022-08-22: 10 mg via INTRAMUSCULAR

## 2022-08-22 NOTE — Assessment & Plan Note (Signed)
Migraine today treated with Toradol 30 mg and Decadron 10 mg.  Will continue topiramate at current strength.  Referral placed to neurology.

## 2022-08-22 NOTE — Progress Notes (Signed)
Christopher Forbes - 22 y.o. male MRN [id number]  Date of birth: 21-Jun-2000  Subjective Chief Complaint  Patient presents with   Headache   Memory Loss    HPI Christopher Forbes is a 22 year old male here today with complaint of headache.  He has had current headache for about 5 days.  He has a history of migraines.  Recently restarted on topiramate due to recurrent migraines.  He has been taken off of this while in prison recently.  He does have Maxalt as well which has not been effective.  He does have nausea and light sensitivity with current headache.  ROS:  A comprehensive ROS was completed and negative except as noted per HPI  Allergies  Allergen Reactions   Egg-Derived Products Nausea And Vomiting   No Healthtouch Food Allergies Nausea And Vomiting    Chicken, Milk    Past Medical History:  Diagnosis Date   Acne    Allergy    Anxiety    Asthma    Chronic headaches    Depression    GERD (gastroesophageal reflux disease)     Past Surgical History:  Procedure Laterality Date   OPEN REDUCTION INTERNAL FIXATION (ORIF) METACARPAL Right 11/19/2018   Procedure: Right small finger metacarpal open malunion repair;  Surgeon: Bradly Bienenstock, MD;  Location: [facility];  Service: Orthopedics;  Laterality: Right;  90 minutes   TYMPANOSTOMY TUBE PLACEMENT Bilateral    placed age 72 have fallen out   UPPER GASTROINTESTINAL ENDOSCOPY     WISDOM TOOTH EXTRACTION  04/04/2021    Social History   Socioeconomic History   Marital status: Single    Spouse name: Not on file   Number of children: Not on file   Years of education: Not on file   Highest education level: Not on file  Occupational History   Occupation: student  Tobacco Use   Smoking status: Never   Smokeless tobacco: Never  Vaping Use   Vaping Use: Former  Substance and Sexual Activity   Alcohol use: No    Alcohol/week: 0.0 standard drinks of alcohol   Drug use: No   Sexual activity: Yes    Partners:  Male  Other Topics Concern   Not on file  Social History Narrative   Patient lives with brother step sister and step dad Transport planner. Mom is involved.   Social Determinants of Health   Financial Resource Strain: High Risk (08/13/2022)   Overall Financial Resource Strain (CARDIA)    Difficulty of Paying Living Expenses: Very hard  Food Insecurity: Food Insecurity Present (08/13/2022)   Hunger Vital Sign    Worried About Running Out of Food in the Last Year: Sometimes true    Ran Out of Food in the Last Year: Sometimes true  Transportation Needs: No Transportation Needs (08/13/2022)   PRAPARE - Administrator, Civil Service (Medical): No    Lack of Transportation (Non-Medical): No  Physical Activity: Insufficiently Active (08/13/2022)   Exercise Vital Sign    Days of Exercise per Week: 2 days    Minutes of Exercise per Session: 20 min  Stress: Stress Concern Present (08/13/2022)   [employer] of Occupational Health - Occupational Stress Questionnaire    Feeling of Stress : To some extent  Social Connections: Unknown (08/13/2022)   Social Connection and Isolation Panel [NHANES]    Frequency of Communication with Friends and Family: Never    Frequency of Social Gatherings with Friends and Family: Patient declined  Attends Religious Services: Patient declined    Active Member of Clubs or Organizations: No    Attends Engineer, structural: Not on file    Marital Status: Never married    Family History  Problem Relation Age of Onset   Diabetes Father    Hyperlipidemia Father    Hypertension Father    COPD Father    Asthma Father    Colon cancer Neg Hx    Esophageal cancer Neg Hx    Rectal cancer Neg Hx    Stomach cancer Neg Hx     Health Maintenance  Topic Date Due   HIV Screening  Never done   Hepatitis C Screening  Never done   COVID-19 Vaccine (3 - 2023-24 season) 11/17/2021   DTaP/Tdap/Td (7 - Td or Tdap) 01/08/2022   HPV VACCINES  Completed      ----------------------------------------------------------------------------------------------------------------------------------------------------------------------------------------------------------------- Physical Exam BP 113/73 (BP Location: Right Arm, Patient Position: Sitting, Cuff Size: Normal)   Pulse 86   Ht 5\' 10"  (1.778 m)   Wt 176 lb (79.8 kg)   SpO2 100%   BMI 25.25 kg/m   Physical Exam Constitutional:      Appearance: He is well-developed.  Cardiovascular:     Rate and Rhythm: Normal rate and regular rhythm.  Pulmonary:     Effort: Pulmonary effort is normal.     Breath sounds: Normal breath sounds.  Neurological:     Mental Status: He is alert.     ------------------------------------------------------------------------------------------------------------------------------------------------------------------------------------------------------------------- Assessment and Plan  Migraine Migraine today treated with Toradol 30 mg and Decadron 10 mg.  Will continue topiramate at current strength.  Referral placed to neurology.   Meds ordered this encounter  Medications   ketorolac (TORADOL) 30 MG/ML injection 30 mg   dexamethasone (DECADRON) injection 10 mg    No follow-ups on file.    This visit occurred during the SARS-CoV-2 public health emergency.  Safety protocols were in place, including screening questions prior to the visit, additional usage of staff PPE, and extensive cleaning of exam room while observing appropriate contact time as indicated for disinfecting solutions.

## 2022-08-25 ENCOUNTER — Ambulatory Visit (INDEPENDENT_AMBULATORY_CARE_PROVIDER_SITE_OTHER): Payer: Medicaid Other

## 2022-08-25 DIAGNOSIS — M25562 Pain in left knee: Secondary | ICD-10-CM | POA: Diagnosis not present

## 2022-08-25 DIAGNOSIS — G8929 Other chronic pain: Secondary | ICD-10-CM | POA: Diagnosis not present

## 2022-08-28 ENCOUNTER — Encounter: Payer: Self-pay | Admitting: Neurology

## 2022-08-29 ENCOUNTER — Ambulatory Visit (INDEPENDENT_AMBULATORY_CARE_PROVIDER_SITE_OTHER): Payer: Medicaid Other

## 2022-08-29 ENCOUNTER — Ambulatory Visit
Admission: EM | Admit: 2022-08-29 | Discharge: 2022-08-29 | Disposition: A | Discharge status: Home / Self Care | Payer: Medicaid Other | Attending: Family Medicine | Admitting: Family Medicine

## 2022-08-29 ENCOUNTER — Encounter: Payer: Self-pay | Admitting: Emergency Medicine

## 2022-08-29 DIAGNOSIS — S60221A Contusion of right hand, initial encounter: Secondary | ICD-10-CM

## 2022-08-29 DIAGNOSIS — M79641 Pain in right hand: Secondary | ICD-10-CM

## 2022-08-29 DIAGNOSIS — S6991XA Unspecified injury of right wrist, hand and finger(s), initial encounter: Secondary | ICD-10-CM | POA: Diagnosis not present

## 2022-08-29 NOTE — Discharge Instructions (Addendum)
Advised patient/mother of x-ray results with hardcopy provided of radiology read and 2 images.  Advised patient to RICE affected area of right hand for 30 minutes 3 times daily for the next 3 days.  Advised may take OTC Ibuprofen 800 mg daily, as needed for right hand pain.  Advised if symptoms worsen and/or unresolved please follow-up with your PCP or orthopedic provider.

## 2022-08-29 NOTE — ED Triage Notes (Signed)
Hurt right hand after hitting a dog to keep it from starting a fight. PMH of hand surgery with plates and screws placed in 2020. Had imaging done yesterday of injured hand for consideration of hardware removal in near future. Hand appears swollen, bruised, and tender.

## 2022-08-29 NOTE — ED Provider Notes (Signed)
[facility]    CSN: [id number] Arrival date & time: 08/29/22  1316      History   Chief Complaint Chief Complaint  Patient presents with   Hand Injury    HPI Christopher Forbes is a 22 y.o. male.   HPI 22 year old male presents with right hand pain after hitting his dog.  Reports he hit his dog to prevent it from starting a fight.  Patient reports history of hand surgery in 2020 where he has plates and screws inserted.  Patient reports having imaging performed yesterday for consideration of possible hardware removal in the near future. PMH significant for polyarthritis with positive rheumatoid factor chronic headaches, depression, and GERD.  Patient is accompanied by his Mother this afternoon.  Past Medical History:  Diagnosis Date   Acne    Allergy    Anxiety    Asthma    Chronic headaches    Depression    GERD (gastroesophageal reflux disease)     Patient Active Problem List   Diagnosis Date Noted   Migraine 08/22/2022   Acute pain of left knee 09/06/2021   Bronchitis 09/06/2021   Learning disability 06/06/2021   Frequent headaches 04/06/2021   Anxiety state 04/06/2021   Chest pain 03/22/2021   Polyarthritis with positive rheumatoid factor (HCC) 09/25/2020   Diarrhea 05/30/2020   Elevated TSH 05/30/2020   Nausea and vomiting 05/30/2020   Well adult exam 05/02/2020   Back pain of thoracolumbar region 12/15/2019   Patellofemoral arthralgia of left knee 12/15/2019   First degree ankle sprain, left, initial encounter 12/15/2019   Current mild episode of major depressive disorder without prior episode (HCC) 02/16/2019   Egg allergy 08/29/2016    Past Surgical History:  Procedure Laterality Date   OPEN REDUCTION INTERNAL FIXATION (ORIF) METACARPAL Right 11/19/2018   Procedure: Right small finger metacarpal open malunion repair;  Surgeon: Bradly Bienenstock, MD;  Location: [facility];  Service: Orthopedics;  Laterality: Right;  90 minutes    TYMPANOSTOMY TUBE PLACEMENT Bilateral    placed age 41 have fallen out   UPPER GASTROINTESTINAL ENDOSCOPY     WISDOM TOOTH EXTRACTION  04/04/2021       Home Medications    Prior to Admission medications   Medication Sig Start Date End Date Taking? Authorizing Provider  albuterol (VENTOLIN HFA) 108 (90 Base) MCG/ACT inhaler Inhale 2 puffs into the lungs 3 (three) times daily then 1 to 2 puffs every 4 hours as needed for shortness of breath and bronchospasms. 03/28/22  Yes Everrett Coombe, DO  buPROPion (WELLBUTRIN XL) 150 MG 24 hr tablet Take 300 mg by mouth daily. 07/19/22  Yes [provider]  escitalopram (LEXAPRO) 20 MG tablet Take 1 tablet (20 mg total) by mouth at bedtime. 11/23/21  Yes Everrett Coombe, DO  omeprazole (PRILOSEC) 40 MG capsule Take 1 capsule (40 mg total) by mouth in the morning and at bedtime. 11/23/21  Yes Everrett Coombe, DO  ondansetron (ZOFRAN-ODT) 4 MG disintegrating tablet Take 1 tablet (4 mg total) by mouth every 8 (eight) hours as needed for nausea or vomiting. 08/16/22  Yes Everrett Coombe, DO  rizatriptan (MAXALT-MLT) 10 MG disintegrating tablet Take 1 tablet (10 mg total) by mouth as needed for migraine. May repeat in 2 hours if needed 08/14/22  Yes Ashley Royalty, Selena Batten, DO  topiramate (TOPAMAX) 50 MG tablet Take 25mg  daily x1 week then increase to 50mg  daily x1, then increase to 50mg  BID x1 week 08/14/22  Yes Everrett Coombe, DO  amitriptyline (ELAVIL) 25 MG tablet TAKE 1 TABLET BY MOUTH AT BEDTIME 08/15/22   Everrett Coombe, DO  SODIUM FLUORIDE 5000 ENAMEL 1.1-5 % GEL USE TO BRUSH TEETH TWICE DAILY 04/26/20   [provider]  Spacer/Aero-Holding Deretha [facility] DIAMOND) MISC  04/04/20   [provider]    Family History Family History  Problem Relation Age of Onset   Diabetes Father    Hyperlipidemia Father    Hypertension Father    COPD Father    Asthma Father    Colon cancer Neg Hx    Esophageal cancer Neg Hx    Rectal cancer Neg Hx     Stomach cancer Neg Hx     Social History Social History   Tobacco Use   Smoking status: Never   Smokeless tobacco: Never  Vaping Use   Vaping Use: Former  Substance Use Topics   Alcohol use: No    Alcohol/week: 0.0 standard drinks of alcohol   Drug use: No     Allergies   Egg-derived products and No healthtouch food allergies   Review of Systems Review of Systems  Musculoskeletal:        Patient reports hurting right hand after hitting his dog-reports keeping it from starting a fight  All other systems reviewed and are negative.    Physical Exam Triage Vital Signs ED Triage Vitals [08/29/22 1330]  Enc Vitals Group     BP      Pulse      Resp      Temp      Temp src      SpO2      Weight      Height      Head Circumference      Peak Flow      Pain Score 6     Pain Loc      Pain Edu?      Excl. in GC?    No data found.  Updated Vital Signs BP 129/82 (BP Location: Left Arm)   Pulse 99   Temp 98.2 F (36.8 C) (Oral)   Resp 16   SpO2 100%       Physical Exam Vitals and nursing note reviewed.  Constitutional:      Appearance: Normal appearance. He is normal weight.  HENT:     Head: Normocephalic and atraumatic.     Mouth/Throat:     Mouth: Mucous membranes are moist.     Pharynx: Oropharynx is clear.  Eyes:     Extraocular Movements: Extraocular movements intact.     Conjunctiva/sclera: Conjunctivae normal.     Pupils: Pupils are equal, round, and reactive to light.  Cardiovascular:     Rate and Rhythm: Normal rate and regular rhythm.     Pulses: Normal pulses.     Heart sounds: Normal heart sounds.  Pulmonary:     Effort: Pulmonary effort is normal.     Breath sounds: Normal breath sounds. No wheezing, rhonchi or rales.  Musculoskeletal:        General: Normal range of motion.     Cervical back: Normal range of motion and neck supple.     Comments: Right hand (dorsum/adjacent to distal ulna): Mild soft tissue swelling noted  Skin:     General: Skin is warm and dry.  Neurological:     General: No focal deficit present.     Mental Status: He is alert and oriented to person, place, and time. Mental status is at baseline.  Psychiatric:        Mood and Affect: Mood normal.        Behavior: Behavior normal.        Thought Content: Thought content normal.      UC Treatments / Results  Labs (all labs ordered are listed, but only abnormal results are displayed) Labs Reviewed - No data to display  EKG   Radiology DG Hand Complete Right  Result Date: 08/29/2022 CLINICAL DATA:  Bruising and tenderness over fifth MCP joint or hardware is located EXAM: RIGHT HAND - COMPLETE 3+ VIEW COMPARISON:  08/12/2019 FINDINGS: Redemonstrated fixation of the distal fifth metacarpal. No perihardware lucency or fracture is seen. No acute fracture or dislocation. The joint spaces are preserved. Alignment is unremarkable. Mild soft tissue swelling over the ulnar aspect of the hand, although this is decreased compared to the prior radiograph. IMPRESSION: 1. Redemonstrated fixation of the distal fifth metacarpal without evidence of hardware complication. 2. No acute fracture or dislocation. Electronically Signed   By: Wiliam Ke M.D.   On: 08/29/2022 14:30    Procedures Procedures (including critical care time)  Medications Ordered in UC Medications - No data to display  Initial Impression / Assessment and Plan / UC Course  I have reviewed the triage vital signs and the nursing notes.  Pertinent labs & imaging results that were available during my care of the patient were reviewed by me and considered in my medical decision making (see chart for details).     MDM: 1.  Injury of right hand, initial encounter-right hand x-ray revealed above; 2.  Contusion of right hand, initial encounter-right hand x-ray revealed above. Advised patient/mother of x-ray results with hardcopy provided of radiology read and 2 images.  Advised patient to RICE  affected area of right hand for 30 minutes 3 times daily for the next 3 days.  Advised may take OTC Ibuprofen 800 mg daily, as needed for right hand pain.  Advised if symptoms worsen and/or unresolved please follow-up with your PCP or orthopedic provider.  Final Clinical Impressions(s) / UC Diagnoses   Final diagnoses:  Injury of right hand, initial encounter  Contusion of right hand, initial encounter     Discharge Instructions      Advised patient/mother of x-ray results with hardcopy provided of radiology read and 2 images.  Advised patient to RICE affected area of right hand for 30 minutes 3 times daily for the next 3 days.  Advised may take OTC Ibuprofen 800 mg daily, as needed for right hand pain.  Advised if symptoms worsen and/or unresolved please follow-up with your PCP or orthopedic provider.     ED Prescriptions   None    PDMP not reviewed this encounter.   Trevor Iha, FNP 08/29/22 1528

## 2022-09-02 ENCOUNTER — Observation Stay (HOSPITAL_COMMUNITY)
Admission: EM | Admit: 2022-09-02 | Discharge: 2022-09-03 | Disposition: A | Discharge status: Other Acute Inpatient Hospital | Payer: Medicaid Other | Attending: Internal Medicine | Admitting: Internal Medicine

## 2022-09-02 ENCOUNTER — Other Ambulatory Visit: Payer: Self-pay

## 2022-09-02 ENCOUNTER — Encounter (HOSPITAL_COMMUNITY): Payer: Self-pay

## 2022-09-02 DIAGNOSIS — E876 Hypokalemia: Secondary | ICD-10-CM | POA: Diagnosis not present

## 2022-09-02 DIAGNOSIS — T50902A Poisoning by unspecified drugs, medicaments and biological substances, intentional self-harm, initial encounter: Principal | ICD-10-CM

## 2022-09-02 DIAGNOSIS — Z79899 Other long term (current) drug therapy: Secondary | ICD-10-CM | POA: Insufficient documentation

## 2022-09-02 DIAGNOSIS — Y730 Diagnostic and monitoring gastroenterology and urology devices associated with adverse incidents: Secondary | ICD-10-CM | POA: Insufficient documentation

## 2022-09-02 DIAGNOSIS — R071 Chest pain on breathing: Secondary | ICD-10-CM

## 2022-09-02 DIAGNOSIS — R0602 Shortness of breath: Secondary | ICD-10-CM

## 2022-09-02 DIAGNOSIS — T1491XA Suicide attempt, initial encounter: Secondary | ICD-10-CM | POA: Diagnosis not present

## 2022-09-02 DIAGNOSIS — Z1152 Encounter for screening for COVID-19: Secondary | ICD-10-CM | POA: Diagnosis not present

## 2022-09-02 DIAGNOSIS — J45909 Unspecified asthma, uncomplicated: Secondary | ICD-10-CM | POA: Diagnosis not present

## 2022-09-02 LAB — ACETAMINOPHEN LEVEL
Acetaminophen (Tylenol), Serum: <10 ug/mL — ABNORMAL LOW (ref 10–30)
Acetaminophen (Tylenol), Serum: <10 ug/mL — ABNORMAL LOW (ref 10–30)

## 2022-09-02 LAB — RAPID URINE DRUG SCREEN, HOSP PERFORMED
Amphetamines: NOT DETECTED
Barbiturates: NOT DETECTED
Benzodiazepines: NOT DETECTED
Cocaine: NOT DETECTED
Opiates: NOT DETECTED
Tetrahydrocannabinol: NOT DETECTED

## 2022-09-02 LAB — COMPREHENSIVE METABOLIC PANEL
ALT: 25 U/L (ref 0–44)
AST: 23 U/L (ref 15–41)
Albumin: 4.8 g/dL (ref 3.5–5.0)
Alkaline Phosphatase: 55 U/L (ref 38–126)
Anion gap: 7 (ref 5–15)
BUN: 10 mg/dL (ref 6–20)
CO2: 22 mmol/L (ref 22–32)
Calcium: 9.1 mg/dL (ref 8.9–10.3)
Chloride: 107 mmol/L (ref 98–111)
Creatinine, Ser: 1.14 mg/dL (ref 0.61–1.24)
GFR, Estimated: >60 mL/min (ref >60)
Glucose, Bld: 100 mg/dL — ABNORMAL HIGH (ref 70–99)
Potassium: 3.3 mmol/L — ABNORMAL LOW (ref 3.5–5.1)
Sodium: 136 mmol/L (ref 135–145)
Total Bilirubin: 0.8 mg/dL (ref 0.3–1.2)
Total Protein: 8.2 g/dL — ABNORMAL HIGH (ref 6.5–8.1)

## 2022-09-02 LAB — CBC WITH DIFFERENTIAL/PLATELET
Abs Immature Granulocytes: 0.02 10*3/uL (ref 0.00–0.07)
Basophils Absolute: 0 10*3/uL (ref 0.0–0.1)
Basophils Relative: 1 %
Eosinophils Absolute: 0.1 10*3/uL (ref 0.0–0.5)
Eosinophils Relative: 1 %
HCT: 44.4 % (ref 39.0–52.0)
Hemoglobin: 15.1 g/dL (ref 13.0–17.0)
Immature Granulocytes: 0 %
Lymphocytes Relative: 27 %
Lymphs Abs: 1.6 10*3/uL (ref 0.7–4.0)
MCH: 31.3 pg (ref 26.0–34.0)
MCHC: 34 g/dL (ref 30.0–36.0)
MCV: 91.9 fL (ref 80.0–100.0)
Monocytes Absolute: 0.7 10*3/uL (ref 0.1–1.0)
Monocytes Relative: 11 %
Neutro Abs: 3.6 10*3/uL (ref 1.7–7.7)
Neutrophils Relative %: 60 %
Platelets: 238 10*3/uL (ref 150–400)
RBC: 4.83 MIL/uL (ref 4.22–5.81)
RDW: 12 % (ref 11.5–15.5)
WBC: 6 10*3/uL (ref 4.0–10.5)
nRBC: 0 % (ref 0.0–0.2)

## 2022-09-02 LAB — ETHANOL: Alcohol, Ethyl (B): <10 mg/dL (ref <10)

## 2022-09-02 LAB — MAGNESIUM
Magnesium: 2.2 mg/dL (ref 1.7–2.4)
Magnesium: 2.1 mg/dL (ref 1.7–2.4)

## 2022-09-02 LAB — BASIC METABOLIC PANEL
Anion gap: 9 (ref 5–15)
BUN: 8 mg/dL (ref 6–20)
CO2: 21 mmol/L — ABNORMAL LOW (ref 22–32)
Calcium: 9.1 mg/dL (ref 8.9–10.3)
Chloride: 107 mmol/L (ref 98–111)
Creatinine, Ser: 1.16 mg/dL (ref 0.61–1.24)
GFR, Estimated: >60 mL/min (ref >60)
Glucose, Bld: 80 mg/dL (ref 70–99)
Potassium: 3.7 mmol/L (ref 3.5–5.1)
Sodium: 137 mmol/L (ref 135–145)

## 2022-09-02 LAB — CBC
HCT: 46.5 % (ref 39.0–52.0)
Hemoglobin: 15.5 g/dL (ref 13.0–17.0)
MCH: 31.1 pg (ref 26.0–34.0)
MCHC: 33.3 g/dL (ref 30.0–36.0)
MCV: 93.4 fL (ref 80.0–100.0)
Platelets: 219 10*3/uL (ref 150–400)
RBC: 4.98 MIL/uL (ref 4.22–5.81)
RDW: 11.9 % (ref 11.5–15.5)
WBC: 5.6 10*3/uL (ref 4.0–10.5)
nRBC: 0 % (ref 0.0–0.2)

## 2022-09-02 LAB — SALICYLATE LEVEL: Salicylate Lvl: <7 mg/dL — ABNORMAL LOW (ref 7.0–30.0)

## 2022-09-02 LAB — HIV ANTIBODY (ROUTINE TESTING W REFLEX): HIV Screen 4th Generation wRfx: NONREACTIVE

## 2022-09-02 MED ORDER — ENOXAPARIN SODIUM 40 MG/0.4ML IJ SOSY
40.0000 mg | PREFILLED_SYRINGE | INTRAMUSCULAR | Status: DC
  Administered 2022-09-02 – 2022-09-03 (×2): 40 mg via SUBCUTANEOUS
  Filled 2022-09-02 (×2): qty 0.4

## 2022-09-02 MED ORDER — POTASSIUM CHLORIDE CRYS ER 20 MEQ PO TBCR
40.0000 meq | EXTENDED_RELEASE_TABLET | Freq: Once | ORAL | Status: AC
  Administered 2022-09-02: 40 meq via ORAL
  Filled 2022-09-02: qty 2

## 2022-09-02 MED ORDER — CHARCOAL ACTIVATED PO LIQD
50.0000 g | Freq: Once | ORAL | Status: AC
  Administered 2022-09-02: 50 g via ORAL
  Filled 2022-09-02: qty 240

## 2022-09-02 MED ORDER — LORAZEPAM 2 MG/ML IJ SOLN: 1.0000 mg | INTRAMUSCULAR | Status: DC | PRN

## 2022-09-02 MED ORDER — SODIUM CHLORIDE 0.9% FLUSH
3.0000 mL | Freq: Two times a day (BID) | INTRAVENOUS | Status: DC
  Administered 2022-09-02 – 2022-09-03 (×3): 3 mL via INTRAVENOUS

## 2022-09-02 MED ORDER — ONDANSETRON HCL 4 MG/2ML IJ SOLN
4.0000 mg | Freq: Three times a day (TID) | INTRAMUSCULAR | Status: DC | PRN
  Administered 2022-09-02: 4 mg via INTRAVENOUS
  Filled 2022-09-02: qty 2

## 2022-09-02 NOTE — ED Provider Notes (Signed)
[place] EMERGENCY DEPARTMENT AT [facility] Provider Note   CSN: [id number] Arrival date & time: 09/02/22  0030     History  Chief Complaint  Patient presents with   Suicide Attempt    Christopher Forbes is a 22 y.o. male.  The history is provided by the patient and medical records.   22 year old male with history of anxiety, frequent headaches, learning disability, GERD, depression, presenting to the ED after intentional overdose.  Patient states he has been battling with a lot of depression recently, mostly due to family stressors.  States tonight he and his family were arguing, mostly between he and his sister.  States he got aggravated, threw a hat and a foam football at her which caused his dad to get mad.  He apparently pulled a 22 pistol out of a cabinet and was holding onto it for a while.  Patient states he took this as a sign that his family did not want him around anymore so he decided to "put everyone out of their misery" and overdosed on his Lexapro.  He is prescribed 20 mg Lexapro, had recent 90-day prescription filled, appears that 36 tablets are missing.  He reports he took this sometime between 64 and 11 PM.  He denies any other coingestions.  He denies any homicidal ideation.  No hallucinations.  Home Medications Prior to Admission medications   Medication Sig Start Date End Date Taking? Authorizing Provider  albuterol (VENTOLIN HFA) 108 (90 Base) MCG/ACT inhaler Inhale 2 puffs into the lungs 3 (three) times daily then 1 to 2 puffs every 4 hours as needed for shortness of breath and bronchospasms. 03/28/22   Everrett Coombe, DO  amitriptyline (ELAVIL) 25 MG tablet TAKE 1 TABLET BY MOUTH AT BEDTIME 08/15/22   Everrett Coombe, DO  buPROPion (WELLBUTRIN XL) 150 MG 24 hr tablet Take 300 mg by mouth daily. 07/19/22   [provider]  escitalopram (LEXAPRO) 20 MG tablet Take 1 tablet (20 mg total) by mouth at bedtime. 11/23/21   Everrett Coombe, DO  omeprazole  (PRILOSEC) 40 MG capsule Take 1 capsule (40 mg total) by mouth in the morning and at bedtime. 11/23/21   Everrett Coombe, DO  ondansetron (ZOFRAN-ODT) 4 MG disintegrating tablet Take 1 tablet (4 mg total) by mouth every 8 (eight) hours as needed for nausea or vomiting. 08/16/22   Everrett Coombe, DO  rizatriptan (MAXALT-MLT) 10 MG disintegrating tablet Take 1 tablet (10 mg total) by mouth as needed for migraine. May repeat in 2 hours if needed 08/14/22   Everrett Coombe, DO  SODIUM FLUORIDE 5000 ENAMEL 1.1-5 % GEL USE TO BRUSH TEETH TWICE DAILY 04/26/20   [provider]  Spacer/Aero-Holding Deretha [facility] DIAMOND) MISC  04/04/20   [provider]  topiramate (TOPAMAX) 50 MG tablet Take 25mg  daily x1 week then increase to 50mg  daily x1, then increase to 50mg  BID x1 week 08/14/22   Everrett Coombe, DO      Allergies    Egg-derived products and No healthtouch food allergies    Review of Systems   Review of Systems  Psychiatric/Behavioral:  Positive for suicidal ideas.   All other systems reviewed and are negative.   Physical Exam Updated Vital Signs BP 126/82   Pulse 97   Temp 98 F (36.7 C)   Resp 19   Ht 5\' 10"  (1.778 m)   Wt 78.9 kg   SpO2 100%   BMI 24.97 kg/m   Physical Exam Vitals and nursing  note reviewed.  Constitutional:      Appearance: He is well-developed.  HENT:     Head: Normocephalic and atraumatic.  Eyes:     Conjunctiva/sclera: Conjunctivae normal.     Pupils: Pupils are equal, round, and reactive to light.  Cardiovascular:     Rate and Rhythm: Normal rate and regular rhythm.     Heart sounds: Normal heart sounds.  Pulmonary:     Effort: Pulmonary effort is normal.     Breath sounds: Normal breath sounds.  Abdominal:     General: Bowel sounds are normal.     Palpations: Abdomen is soft.  Musculoskeletal:        General: Normal range of motion.     Cervical back: Normal range of motion.  Skin:    General: Skin is warm and dry.   Neurological:     Mental Status: He is alert and oriented to person, place, and time.  Psychiatric:        Thought Content: Thought content includes suicidal ideation.     ED Results / Procedures / Treatments   Labs (all labs ordered are listed, but only abnormal results are displayed) Labs Reviewed  COMPREHENSIVE METABOLIC PANEL - Abnormal; Notable for the following components:      Result Value   Potassium 3.3 (*)    Glucose, Bld 100 (*)    Total Protein 8.2 (*)    All other components within normal limits  SALICYLATE LEVEL - Abnormal; Notable for the following components:   Salicylate Lvl <7.0 (*)    All other components within normal limits  ACETAMINOPHEN LEVEL - Abnormal; Notable for the following components:   Acetaminophen (Tylenol), Serum <10 (*)    All other components within normal limits  ACETAMINOPHEN LEVEL - Abnormal; Notable for the following components:   Acetaminophen (Tylenol), Serum <10 (*)    All other components within normal limits  CBC WITH DIFFERENTIAL/PLATELET  ETHANOL  MAGNESIUM  RAPID URINE DRUG SCREEN, HOSP PERFORMED  HIV ANTIBODY (ROUTINE TESTING W REFLEX)  BASIC METABOLIC PANEL  MAGNESIUM  CBC    EKG EKG Interpretation  Date/Time:  Sunday September 02 2022 02:51:58 EDT Ventricular Rate:  88 PR Interval:  169 QRS Duration: 100 QT Interval:  411 QTC Calculation: 498 R Axis:   81 Text Interpretation: Sinus rhythm Borderline ST elevation, anterior leads Prolonged QT interval No significant change was found Confirmed by Glynn Octave [phone]) on 09/02/2022 3:41:10 AM  Radiology No results found.  Procedures Procedures    CRITICAL CARE Performed by: Garlon Hatchet   Total critical care time: 45 minutes  Critical care time was exclusive of separately billable procedures and treating other patients.  Critical care was necessary to treat or prevent imminent or life-threatening deterioration.  Critical care was time spent personally by  me on the following activities: development of treatment plan with patient and/or surrogate as well as nursing, discussions with consultants, evaluation of patient's response to treatment, examination of patient, obtaining history from patient or surrogate, ordering and performing treatments and interventions, ordering and review of laboratory studies, ordering and review of radiographic studies, pulse oximetry and re-evaluation of patient's condition.   Medications Ordered in ED Medications - No data to display  ED Course/ Medical Decision Making/ A&P                             Medical Decision Making Amount and/or Complexity of Data Reviewed Labs: ordered.  ECG/medicine tests: ordered and independent interpretation performed.  Risk OTC drugs. Prescription drug management. Decision regarding hospitalization.   22 year old male presenting to the ED after intentional overdose of Lexapro.  Admits to some worsening depression due to family issues.  States he "wanted to put everyone out of their misery".  Apparently took 36 tablets of his 20 mg Lexapro between 10-11pm.  Denies other co-ingestion.  Currently asymptomatic without nausea/vomiting, chest pain, tachycardia.  Will check EKG, labs.  Will discuss with poison control.  12:57 AM Spoke with poison control, RN Danielle, recommendations as follows: -  recommended minimum 24 hour obs, if still symptomatic after that time will need to observe until back to baseline -  Dose of charcoal now without sorbitol, max 75 g.   -  There is potential for seizures, QRS/QTc widening, hypertension or hypotension. - Recommended supportive care for now, benzos if any seizure activity.   - IV fluids and norepi if becoming hypotensive. - If QRS widening, sodium bicarb.   - If QTc prolonged titrate potassium to 4 and magnesium to 2.   - Recommended EKG Q4H, repeat APAP at 3am.  Initial labs overall reassuring--no leukocytosis or electrolyte derangement.   Negative Tylenol, salicylate, and ethanol levels.  UDS is pending.  Given he will require minimum 24-hour observation, will admit for ongoing care.  Discussed with hospitalist, Dr. Antionette Char-- will admit for ongoing care.  Aware of poison control recommendations.  He will need psychiatric consult once medically cleared.  Final Clinical Impression(s) / ED Diagnoses Final diagnoses:  Suicide attempt [facility])  Intentional overdose, initial encounter [facility])    Rx / DC Orders ED Discharge Orders     None         Garlon Hatchet, PA-C 09/02/22 0341    Glynn Octave, MD 09/02/22 [phone]

## 2022-09-02 NOTE — ED Notes (Signed)
Pt encouraged to provide a sample. States he doesn't have to at this moment.

## 2022-09-02 NOTE — Progress Notes (Signed)
Patient admitted early this morning for intentional overdose.  Patient seen and examined at bedside and plan of care discussed with  him.  I have reviewed patient's medical records including this morning's H&P, current vitals, labs and medications myself.  Consult psychiatry.

## 2022-09-02 NOTE — ED Triage Notes (Signed)
Patient brought in by [facility], reports suicide attempt tonight by taking unknown amount of lexapro at 2310. Patient states bottle was almost full of 90 day supply and EMS reports 36 pills are missing from bottle. Patient has been dealing with stress from home life with family and states it got to be too much tonight. 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 Patient reports feeling tingling sensation all over body.

## 2022-09-02 NOTE — Progress Notes (Signed)
Poison Control RN called this RN and was updated to most recent VS and EKG. Poison Control RN made this RN aware that they are signing off at this time but if there are any future concerns to call them back.

## 2022-09-02 NOTE — ED Notes (Signed)
ED TO INPATIENT HANDOFF REPORT  ED Nurse Name and Phone #: Shanda Bumps RN  S Name/Age/Gender Christopher Forbes 21 y.o. male Room/Bed: WA18/WA18  Code Status   Code Status: Full Code  Home/SNF/Other Home Patient oriented to: self, place, time, and situation Is this baseline? Yes   Triage Complete: Triage complete  Chief Complaint Intentional overdose [facility]) [T50.902A]  Triage Note Patient brought in by [facility], reports suicide attempt tonight by taking unknown amount of lexapro at 2310. Patient states bottle was almost full of 90 day supply and EMS reports 36 pills are missing from bottle. Patient has been dealing with stress from home life with family and states it got to be too much tonight. 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 Patient reports feeling tingling sensation all over body.   Allergies Allergies  Allergen Reactions   Egg-Derived Products Nausea And Vomiting   No [employer] Food Allergies Nausea And Vomiting    Chicken, Milk    Level of Care/Admitting Diagnosis ED Disposition     ED Disposition  Admit   Condition  --   Comment  Hospital Area: [facility] [[id number]]  Level of Care: Telemetry [5]  Admit to tele based on following criteria: Monitor QTC interval  May place patient in observation at [facility] or [facility] if equivalent level of care is available:: Yes  Covid Evaluation: Asymptomatic - no recent exposure (last 10 days) testing not required  Diagnosis: Intentional overdose [facility]) [[id number]]  Admitting Physician: Briscoe Deutscher [[id number]]  Attending Physician: Briscoe Deutscher [[id number]]          B Medical/Surgery History Past Medical History:  Diagnosis Date   Acne    Allergy    Anxiety    Asthma    Chronic headaches    Depression    GERD (gastroesophageal reflux disease)    Past Surgical History:  Procedure Laterality Date   OPEN REDUCTION INTERNAL FIXATION (ORIF)  METACARPAL Right 11/19/2018   Procedure: Right small finger metacarpal open malunion repair;  Surgeon: Bradly Bienenstock, MD;  Location: [facility];  Service: Orthopedics;  Laterality: Right;  90 minutes   TYMPANOSTOMY TUBE PLACEMENT Bilateral    placed age 28 have fallen out   UPPER GASTROINTESTINAL ENDOSCOPY     WISDOM TOOTH EXTRACTION  04/04/2021     A IV Location/Drains/Wounds Patient Lines/Drains/Airways Status     Active Line/Drains/Airways     Name Placement date Placement time Site Days   Peripheral IV 09/02/22 20 G Left Antecubital 09/02/22  0041  Antecubital  less than 1            Intake/Output Last 24 hours No intake or output data in the 24 hours ending 09/02/22 0359  Labs/Imaging Results for orders placed or performed during the hospital encounter of 09/02/22 (from the past 48 hour(s))  CBC with Differential     Status: None   Collection Time: 09/02/22  1:00 AM  Result Value Ref Range   WBC 6.0 4.0 - 10.5 K/uL   RBC 4.83 4.22 - 5.81 MIL/uL   Hemoglobin 15.1 13.0 - 17.0 g/dL   HCT 25.3 66.4 - 40.3 %   MCV 91.9 80.0 - 100.0 fL   MCH 31.3 26.0 - 34.0 pg   MCHC 34.0 30.0 - 36.0 g/dL   RDW 47.4 25.9 - 56.3 %   Platelets 238 150 - 400 K/uL   nRBC 0.0 0.0 - 0.2 %  Neutrophils Relative % 60 %   Neutro Abs 3.6 1.7 - 7.7 K/uL   Lymphocytes Relative 27 %   Lymphs Abs 1.6 0.7 - 4.0 K/uL   Monocytes Relative 11 %   Monocytes Absolute 0.7 0.1 - 1.0 K/uL   Eosinophils Relative 1 %   Eosinophils Absolute 0.1 0.0 - 0.5 K/uL   Basophils Relative 1 %   Basophils Absolute 0.0 0.0 - 0.1 K/uL   Immature Granulocytes 0 %   Abs Immature Granulocytes 0.02 0.00 - 0.07 K/uL    Comment: Performed at [facility], [address] [place], [place] [address]  Comprehensive metabolic panel     Status: Abnormal   Collection Time: 09/02/22  1:00 AM  Result Value Ref Range   Sodium 136 135 - 145 mmol/L   Potassium 3.3 (L) 3.5 - 5.1 mmol/L   Chloride  107 98 - 111 mmol/L   CO2 22 22 - 32 mmol/L   Glucose, Bld 100 (H) 70 - 99 mg/dL    Comment: Glucose reference range applies only to samples taken after fasting for at least 8 hours.   BUN 10 6 - 20 mg/dL   Creatinine, Ser 6.04 0.61 - 1.24 mg/dL   Calcium 9.1 8.9 - 54.0 mg/dL   Total Protein 8.2 (H) 6.5 - 8.1 g/dL   Albumin 4.8 3.5 - 5.0 g/dL   AST 23 15 - 41 U/L   ALT 25 0 - 44 U/L   Alkaline Phosphatase 55 38 - 126 U/L   Total Bilirubin 0.8 0.3 - 1.2 mg/dL   GFR, Estimated >98 >11 mL/min    Comment: (NOTE) Calculated using the CKD-EPI Creatinine Equation (2021)    Anion gap 7 5 - 15    Comment: Performed at [facility], [address], [place], [place] [address]  Ethanol     Status: None   Collection Time: 09/02/22  1:00 AM  Result Value Ref Range   Alcohol, Ethyl (B) <10 <10 mg/dL    Comment: (NOTE) Lowest detectable limit for serum alcohol is 10 mg/dL.  For medical purposes only. Performed at [facility], [address] [place], [place] [address]   Salicylate level     Status: Abnormal   Collection Time: 09/02/22  1:00 AM  Result Value Ref Range   Salicylate Lvl <7.0 (L) 7.0 - 30.0 mg/dL    Comment: Performed at [facility], [address], [place], [place] [address]  Acetaminophen level     Status: Abnormal   Collection Time: 09/02/22  1:00 AM  Result Value Ref Range   Acetaminophen (Tylenol), Serum <10 (L) 10 - 30 ug/mL    Comment: (NOTE) Therapeutic concentrations vary significantly. A range of 10-30 ug/mL  may be an effective concentration for many patients. However, some  are best treated at concentrations outside of this range. Acetaminophen concentrations >150 ug/mL at 4 hours after ingestion  and >50 ug/mL at 12 hours after ingestion are often associated with  toxic reactions.  Performed at [facility], [address] [place], [place] [address]   Magnesium     Status: None    Collection Time: 09/02/22  1:00 AM  Result Value Ref Range   Magnesium 2.2 1.7 - 2.4 mg/dL    Comment: Performed at [facility], [address], [place], [place] [address]  Acetaminophen level     Status: Abnormal   Collection Time: 09/02/22  2:54 AM  Result  Value Ref Range   Acetaminophen (Tylenol), Serum <10 (L) 10 - 30 ug/mL    Comment: (NOTE) Therapeutic concentrations vary significantly. A range of 10-30 ug/mL  may be an effective concentration for many patients. However, some  are best treated at concentrations outside of this range. Acetaminophen concentrations >150 ug/mL at 4 hours after ingestion  and >50 ug/mL at 12 hours after ingestion are often associated with  toxic reactions.  Performed at [facility], [address], [place], [place] [address]    No results found.  Pending Labs Unresulted Labs (From admission, onward)     Start     Ordered   09/09/22 0500  Creatinine, serum  (enoxaparin (LOVENOX)    CrCl >/= 30 ml/min)  Weekly,   R     Comments: while on enoxaparin therapy    09/02/22 0226   09/02/22 0500  HIV Antibody (routine testing w rflx)  (HIV Antibody (Routine testing w reflex) panel)  Tomorrow morning,   R        09/02/22 0226   09/02/22 0500  Basic metabolic panel  Daily,   R      09/02/22 0226   09/02/22 0500  Magnesium  Daily,   R      09/02/22 0226   09/02/22 0500  CBC  Daily,   R      09/02/22 0226   09/02/22 0050  Rapid urine drug screen (hospital performed)  ONCE - STAT,   STAT        09/02/22 0050            Vitals/Pain Today's Vitals   09/02/22 0130 09/02/22 0200 09/02/22 0230 09/02/22 0300  BP: 124/88 119/78 110/66 115/75  Pulse: 85 83 87 85  Resp: 17 14 14  (!) 21  Temp:      SpO2: 100% 100% 100% 100%  Weight:      Height:      PainSc:        Isolation Precautions No active isolations  Medications Medications  enoxaparin (LOVENOX) injection 40 mg (has no administration in time  range)  sodium chloride flush (NS) 0.9 % injection 3 mL (3 mLs Intravenous Given 09/02/22 0249)  LORazepam (ATIVAN) injection 1 mg (has no administration in time range)  charcoal activated (NO SORBITOL) (ACTIDOSE-AQUA) suspension 50 g (50 g Oral Given 09/02/22 0139)  potassium chloride SA (KLOR-CON M) CR tablet 40 mEq (40 mEq Oral Given 09/02/22 0248)    Mobility walks     Focused Assessments Neuro Assessment Handoff:  Swallow screen pass? Yes          Neuro Assessment:   Neuro Checks:       R Recommendations: See Admitting Provider Note  Report given to:   Additional Notes:
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
## 2022-09-02 NOTE — H&P (Signed)
History and Physical    Christopher Forbes [id number]:[id number] DOB: 20-Aug-2000 DOA: 09/02/2022  PCP: Everrett Coombe, DO   Patient coming from: Home   Chief Complaint: Intentional overdose   HPI: Christopher Forbes is a 22 y.o. male with medical history significant for chronic headaches, depression, anxiety, and GERD who presents to the ED after an intentional Lexapro overdose.  Patient reports struggling with depression recently, had been arguing with family last night, and then intentionally overdosed on his prescribed Lexapro 20 mg.  By pill count, EMS suspects that he took 36 tablets. The ingestion occurred around 10 or 11 pm. The patient denies taking any other medications, illicit drugs, or alcohol. He denies homicidal ideation or hallucinations.    ED Course: Upon arrival to the ED, patient is found to be afebrile and saturating well on room air with normal heart rate and stable blood pressure.  EKG demonstrates sinus rhythm with rate 97 and QTc 486.  Labs are notable for potassium 3.3.  Poison control was contacted by the ED PA and recommended at least 24 hours observation, activated charcoal, serial EKGs, benzodiazepines if needed for seizures, sodium bicarbonate if QRS widens, keep potassium at least 4 and magnesium at at least 2 if QT interval becomes prolonged, repeat acetaminophen level at 3 AM, and give IV fluids and then norepinephrine if he becomes hypotensive.    Patient was given 50 g of activated charcoal in the ED.  Review of Systems:  All other systems reviewed and apart from HPI, are negative.  Past Medical History:  Diagnosis Date   Acne    Allergy    Anxiety    Asthma    Chronic headaches    Depression    GERD (gastroesophageal reflux disease)     Past Surgical History:  Procedure Laterality Date   OPEN REDUCTION INTERNAL FIXATION (ORIF) METACARPAL Right 11/19/2018   Procedure: Right small finger metacarpal open malunion repair;  Surgeon: Bradly Bienenstock, MD;   Location: [facility];  Service: Orthopedics;  Laterality: Right;  90 minutes   TYMPANOSTOMY TUBE PLACEMENT Bilateral    placed age 28 have fallen out   UPPER GASTROINTESTINAL ENDOSCOPY     WISDOM TOOTH EXTRACTION  04/04/2021    Social History:   reports that he has never smoked. He has never used smokeless tobacco. He reports that he does not drink alcohol and does not use drugs.  Allergies  Allergen Reactions   Egg-Derived Products Nausea And Vomiting   No Healthtouch Food Allergies Nausea And Vomiting    Chicken, Milk    Family History  Problem Relation Age of Onset   Diabetes Father    Hyperlipidemia Father    Hypertension Father    COPD Father    Asthma Father    Colon cancer Neg Hx    Esophageal cancer Neg Hx    Rectal cancer Neg Hx    Stomach cancer Neg Hx      Prior to Admission medications   Medication Sig Start Date End Date Taking? Authorizing Provider  albuterol (VENTOLIN HFA) 108 (90 Base) MCG/ACT inhaler Inhale 2 puffs into the lungs 3 (three) times daily then 1 to 2 puffs every 4 hours as needed for shortness of breath and bronchospasms. 03/28/22  Yes Everrett Coombe, DO  amitriptyline (ELAVIL) 25 MG tablet TAKE 1 TABLET BY MOUTH AT BEDTIME 08/15/22  Yes Everrett Coombe, DO  buPROPion (WELLBUTRIN XL) 150 MG 24 hr tablet Take 300 mg by mouth daily. 07/19/22  Yes [provider]  cyclobenzaprine (FLEXERIL) 5 MG tablet Take 5-10 mg by mouth at bedtime as needed for muscle spasms.   Yes [provider]  escitalopram (LEXAPRO) 20 MG tablet Take 1 tablet (20 mg total) by mouth at bedtime. 11/23/21  Yes Everrett Coombe, DO  omeprazole (PRILOSEC) 40 MG capsule Take 1 capsule (40 mg total) by mouth in the morning and at bedtime. 11/23/21  Yes Everrett Coombe, DO  ondansetron (ZOFRAN-ODT) 4 MG disintegrating tablet Take 1 tablet (4 mg total) by mouth every 8 (eight) hours as needed for nausea or vomiting. 08/16/22  Yes Everrett Coombe, DO  rizatriptan  (MAXALT-MLT) 10 MG disintegrating tablet Take 1 tablet (10 mg total) by mouth as needed for migraine. May repeat in 2 hours if needed 08/14/22  Yes Ashley Royalty, Selena Batten, DO  topiramate (TOPAMAX) 50 MG tablet Take 25mg  daily x1 week then increase to 50mg  daily x1, then increase to 50mg  BID x1 week Patient taking differently: Take 50 mg by mouth every 6 (six) hours as needed (migraines). Take 25mg  daily x1 week then increase to 50mg  daily x1, then increase to 50mg  BID x1 week 08/14/22  Yes Everrett Coombe, DO  SODIUM FLUORIDE 5000 ENAMEL 1.1-5 % GEL USE TO BRUSH TEETH TWICE DAILY 04/26/20   [provider]  Spacer/Aero-Holding Chambers [facility]) MISC  04/04/20   [provider]    Physical Exam: Vitals:   09/02/22 0040 09/02/22 0041 09/02/22 0100  BP: 126/82  117/81  Pulse: 97  91  Resp: 19  20  Temp: 98 F (36.7 C)    SpO2: 100%  100%  Weight:  78.9 kg   Height:  5\' 10"  (1.778 m)      Constitutional: NAD, no pallor or diaphoresis   Eyes: PERTLA, lids and conjunctivae normal ENMT: Mucous membranes are moist. Posterior pharynx clear of any exudate or lesions.   Neck: supple, no masses  Respiratory: no wheezing, no crackles. No accessory muscle use.  Cardiovascular: S1 & S2 heard, regular rate and rhythm. No extremity edema.   Abdomen: No distension, no tenderness, soft. Bowel sounds active.  Musculoskeletal: no clubbing / cyanosis. No joint deformity upper and lower extremities.   Skin: no significant rashes, lesions, ulcers. Warm, dry, well-perfused. Neurologic: CN 2-12 grossly intact. Moving all extremities. Alert and oriented.  Psychiatric: Calm. Cooperative.    Labs and Imaging on Admission: I have personally reviewed following labs and imaging studies  CBC: Recent Labs  Lab 09/02/22 0100  WBC 6.0  NEUTROABS 3.6  HGB 15.1  HCT 44.4  MCV 91.9  PLT 238   Basic Metabolic Panel: Recent Labs  Lab 09/02/22 0100  NA 136  K 3.3*  CL 107  CO2 22   GLUCOSE 100*  BUN 10  CREATININE 1.14  CALCIUM 9.1  MG 2.2   GFR: Estimated Creatinine Clearance: 106.7 mL/min (by C-G formula based on SCr of 1.14 mg/dL). Liver Function Tests: Recent Labs  Lab 09/02/22 0100  AST 23  ALT 25  ALKPHOS 55  BILITOT 0.8  PROT 8.2*  ALBUMIN 4.8   No results for input(s): "LIPASE", "AMYLASE" in the last 168 hours. No results for input(s): "AMMONIA" in the last 168 hours. Coagulation Profile: No results for input(s): "INR", "PROTIME" in the last 168 hours. Cardiac Enzymes: No results for input(s): "CKTOTAL", "CKMB", "CKMBINDEX", "TROPONINI" in the last 168 hours. BNP (last 3 results) No results for input(s): "PROBNP" in the last 8760 hours. HbA1C: No results for input(s): "HGBA1C" in  the last 72 hours. CBG: No results for input(s): "GLUCAP" in the last 168 hours. Lipid Profile: No results for input(s): "CHOL", "HDL", "LDLCALC", "TRIG", "CHOLHDL", "LDLDIRECT" in the last 72 hours. Thyroid Function Tests: No results for input(s): "TSH", "T4TOTAL", "FREET4", "T3FREE", "THYROIDAB" in the last 72 hours. Anemia Panel: No results for input(s): "VITAMINB12", "FOLATE", "FERRITIN", "TIBC", "IRON", "RETICCTPCT" in the last 72 hours. Urine analysis:    Component Value Date/Time   BILIRUBINUR negative 03/07/2021 0848   KETONESUR negative 03/07/2021 0848   PROTEINUR negative 05/25/2020 0954   UROBILINOGEN 0.2 03/07/2021 0848   NITRITE Negative 03/07/2021 0848   LEUKOCYTESUR Negative 03/07/2021 0848   Sepsis Labs: @LABRCNTIP (procalcitonin:4,lacticidven:4) )No results found for this or any previous visit (from the past 240 hour(s)).   Radiological Exams on Admission: No results found.  EKG: Independently reviewed. Sinus rhythm, rate 97, QTc 486.   Assessment/Plan   1. Intentional Lexapro overdose; depression; anxiety  - Given charcoal in ED  - Suicide precautions, monitor QRS and QT intervals, benzodiazepine if needed for seizure, IVF and  Levophed if becomes hypotensive, repeat APAP level at 3 am, consult psychiatry when medically cleared    2. Hypokalemia  - Replacing     DVT prophylaxis: Lovenox  Code Status: Full  Level of Care: Level of care: Telemetry Family Communication: none present  Disposition Plan:  Patient is from: Home  Anticipated d/c is to: TBD Anticipated d/c date is: 09/03/22  Patient currently: needs at least 24 hrs observation in hospital per Poison Control, and will then need psychiatry consultation  Consults called: None  Admission status: Observation     Briscoe Deutscher, MD [facility]  09/02/2022, 2:27 AM

## 2022-09-02 NOTE — ED Notes (Signed)
Pt has been dressed out in burgundy scrubs, belongings have been placed in the 16-18 cabinet. Pt came in with shirt jeans necklace, shoes, socks, phone, air pods. Wanded  by security

## 2022-09-03 DIAGNOSIS — T50902A Poisoning by unspecified drugs, medicaments and biological substances, intentional self-harm, initial encounter: Secondary | ICD-10-CM | POA: Diagnosis not present

## 2022-09-03 DIAGNOSIS — E876 Hypokalemia: Secondary | ICD-10-CM | POA: Diagnosis not present

## 2022-09-03 DIAGNOSIS — J45909 Unspecified asthma, uncomplicated: Secondary | ICD-10-CM | POA: Diagnosis not present

## 2022-09-03 DIAGNOSIS — Z1152 Encounter for screening for COVID-19: Secondary | ICD-10-CM | POA: Diagnosis not present

## 2022-09-03 DIAGNOSIS — T1491XA Suicide attempt, initial encounter: Secondary | ICD-10-CM | POA: Diagnosis not present

## 2022-09-03 LAB — CBC
HCT: 49.6 % (ref 39.0–52.0)
Hemoglobin: 16.4 g/dL (ref 13.0–17.0)
MCH: 31.4 pg (ref 26.0–34.0)
MCHC: 33.1 g/dL (ref 30.0–36.0)
MCV: 94.8 fL (ref 80.0–100.0)
Platelets: 204 10*3/uL (ref 150–400)
RBC: 5.23 MIL/uL (ref 4.22–5.81)
RDW: 11.9 % (ref 11.5–15.5)
WBC: 7.2 10*3/uL (ref 4.0–10.5)
nRBC: 0 % (ref 0.0–0.2)

## 2022-09-03 LAB — COMPREHENSIVE METABOLIC PANEL
ALT: 24 U/L (ref 0–44)
AST: 22 U/L (ref 15–41)
Albumin: 4.7 g/dL (ref 3.5–5.0)
Alkaline Phosphatase: 52 U/L (ref 38–126)
Anion gap: 8 (ref 5–15)
BUN: 14 mg/dL (ref 6–20)
CO2: 25 mmol/L (ref 22–32)
Calcium: 9.6 mg/dL (ref 8.9–10.3)
Chloride: 105 mmol/L (ref 98–111)
Creatinine, Ser: 1.32 mg/dL — ABNORMAL HIGH (ref 0.61–1.24)
GFR, Estimated: >60 mL/min (ref >60)
Glucose, Bld: 83 mg/dL (ref 70–99)
Potassium: 4.1 mmol/L (ref 3.5–5.1)
Sodium: 138 mmol/L (ref 135–145)
Total Bilirubin: 0.7 mg/dL (ref 0.3–1.2)
Total Protein: 8.4 g/dL — ABNORMAL HIGH (ref 6.5–8.1)

## 2022-09-03 LAB — MAGNESIUM: Magnesium: 2.4 mg/dL (ref 1.7–2.4)

## 2022-09-03 LAB — SARS CORONAVIRUS 2 BY RT PCR: SARS Coronavirus 2 by RT PCR: NEGATIVE

## 2022-09-03 MED ORDER — ALBUTEROL SULFATE HFA 108 (90 BASE) MCG/ACT IN AERS
2.0000 | INHALATION_SPRAY | RESPIRATORY_TRACT | Status: DC | PRN
Start: 2022-09-03 — End: 2024-01-21

## 2022-09-03 MED ORDER — ACETAMINOPHEN 325 MG PO TABS
650.0000 mg | ORAL_TABLET | Freq: Four times a day (QID) | ORAL | Status: DC | PRN
  Administered 2022-09-03: 650 mg via ORAL
  Filled 2022-09-03: qty 2

## 2022-09-03 MED ORDER — METHOCARBAMOL 500 MG PO TABS
500.0000 mg | ORAL_TABLET | Freq: Four times a day (QID) | ORAL | Status: DC | PRN
  Administered 2022-09-03: 500 mg via ORAL
  Filled 2022-09-03: qty 1

## 2022-09-03 NOTE — TOC Transition Note (Signed)
Transition of Care [facility]) - CM/SW Discharge Note   Patient Details  Name: Christopher Forbes MRN: [id number] Date of Birth: Apr 11, 2000  Transition of [facility] CM/SW Contact:  Larrie Kass, LCSW Phone Number: 09/03/2022, 1:43 PM   Clinical Narrative:     Pt [facility] to [facility], accepting [facility] [place],going to 2 east unit , call report [phone]. Sheriff called for transport. No further TOC needs, TOC sign off.       Barriers to Discharge: Psych Bed not available   Patient Goals and CMS Choice      Discharge Placement                         Discharge Plan and Services Additional resources added to the After Visit Summary for   In-house Referral: Clinical Social Work                                   Social Determinants of Health (SDOH) Interventions SDOH Screenings   Food Insecurity: No Food Insecurity (09/02/2022)  Recent Concern: Food Insecurity - Food Insecurity Present (08/13/2022)  Housing: Low Risk  (09/02/2022)  Recent Concern: Housing - High Risk (08/13/2022)  Transportation Needs: No Transportation Needs (09/02/2022)  Utilities: Not At Risk (09/02/2022)  Depression (PHQ2-9): Medium Risk (09/06/2021)  Financial Resource Strain: High Risk (08/13/2022)  Physical Activity: Insufficiently Active (08/13/2022)  Social Connections: Unknown (08/13/2022)  Stress: Stress Concern Present (08/13/2022)  Tobacco Use: Low Risk  (09/02/2022)     Readmission Risk Interventions     No data to display

## 2022-09-03 NOTE — Discharge Summary (Signed)
Physician Discharge Summary  Christopher Forbes ZOX:[id number] DOB: 08/05/2000 DOA: 09/02/2022  PCP: Everrett Coombe, DO  Admit date: 09/02/2022 Discharge date: 09/03/2022  Admitted From: Home Disposition: [facility]  Recommendations for Outpatient Follow-up:  Follow up with [facility] provider at earliest convenience    Home Health: No Equipment/Devices: None  Discharge Condition: Stable CODE STATUS: Full Diet recommendation: Regular  Brief/Interim Summary:  22 y.o. male with medical history significant for chronic headaches, depression, anxiety, and GERD presented with an intentional Lexapro overdose.  Poison control recommended 24 hours of observation, activated charcoal, serial EKGs and benzodiazepines if needed for seizures.  Patient has completed 24 hours of observation; Poison control has signed off.  Psychiatry recommended inpatient psychiatric hospitalization once medically stable.  Patient is medically stable for discharge.  He will be discharged to inpatient psychiatric hospital once bed is available.  Discharge Diagnoses:   Intentional Lexapro overdose Depression/anxiety -Poison control recommended 24 hours of observation, activated charcoal, serial EKGs and benzodiazepines if needed for seizures.  Patient has completed 24 hours of observation; Poison control has signed off.  Psychiatry recommended inpatient psychiatric hospitalization once medically stable.  Patient is medically stable for discharge.  He will be discharged to inpatient psychiatric hospital once bed is available. -Meds for depression/anxiety to be addressed by psychiatry  Hypokalemia -Resolved   Discharge Instructions   Allergies as of 09/03/2022       Reactions   Egg-derived Products Nausea And Vomiting   No Healthtouch Food Allergies Nausea And Vomiting   Chicken, Milk        Medication List     STOP taking these medications    amitriptyline 25 MG tablet Commonly known as: ELAVIL   buPROPion 150 MG 24  hr tablet Commonly known as: WELLBUTRIN XL   cyclobenzaprine 5 MG tablet Commonly known as: FLEXERIL   escitalopram 20 MG tablet Commonly known as: LEXAPRO   Sodium Fluoride 5000 Enamel 1.1-5 % Gel Generic drug: Sod Fluoride-Potassium Nitrate       TAKE these medications    albuterol 108 (90 Base) MCG/ACT inhaler Commonly known as: Ventolin HFA Inhale 2 puffs into the lungs every 4 (four) hours as needed for wheezing or shortness of breath. What changed:  when to take this reasons to take this   omeprazole 40 MG capsule Commonly known as: PRILOSEC Take 1 capsule (40 mg total) by mouth in the morning and at bedtime.   ondansetron 4 MG disintegrating tablet Commonly known as: ZOFRAN-ODT Take 1 tablet (4 mg total) by mouth every 8 (eight) hours as needed for nausea or vomiting.   optichamber diamond Misc   rizatriptan 10 MG disintegrating tablet Commonly known as: Maxalt-MLT Take 1 tablet (10 mg total) by mouth as needed for migraine. May repeat in 2 hours if needed   topiramate 50 MG tablet Commonly known as: Topamax Take 25mg  daily x1 week then increase to 50mg  daily x1, then increase to 50mg  BID x1 week What changed:  how much to take how to take this when to take this reasons to take this        Allergies  Allergen Reactions   Egg-Derived Products Nausea And Vomiting   No Healthtouch Food Allergies Nausea And Vomiting    Chicken, Milk    Consultations: Psychiatry   Procedures/Studies: DG Hand Complete Right  Result Date: 08/29/2022 CLINICAL DATA:  Bruising and tenderness over fifth MCP joint or hardware is located EXAM: RIGHT HAND - COMPLETE 3+ VIEW COMPARISON:  08/12/2019 FINDINGS: Redemonstrated fixation  of the distal fifth metacarpal. No perihardware lucency or fracture is seen. No acute fracture or dislocation. The joint spaces are preserved. Alignment is unremarkable. Mild soft tissue swelling over the ulnar aspect of the hand, although this is  decreased compared to the prior radiograph. IMPRESSION: 1. Redemonstrated fixation of the distal fifth metacarpal without evidence of hardware complication. 2. No acute fracture or dislocation. Electronically Signed   By: Wiliam Ke M.D.   On: 08/29/2022 14:30      Subjective: Patient seen and examined at bedside.  Denies any worsening shortness of breath, vomiting or fever.  Discharge Exam: Vitals:   09/02/22 2012 09/03/22 0621  BP: 117/79 111/69  Pulse: 82 76  Resp: 20 19  Temp: 98.4 F (36.9 C) 98.4 F (36.9 C)  SpO2: 100% 99%    General: Pt is alert, awake, not in acute distress.  On room air.  Slow to respond.  Poor historian.  Flat affect. Cardiovascular: rate controlled, S1/S2 + Respiratory: bilateral decreased breath sounds at bases Abdominal: Soft, NT, ND, bowel sounds + Extremities: no edema, no cyanosis    The results of significant diagnostics from this hospitalization (including imaging, microbiology, ancillary and laboratory) are listed below for reference.     Microbiology: No results found for this or any previous visit (from the past 240 hour(s)).   Labs: BNP (last 3 results) No results for input(s): "BNP" in the last 8760 hours. Basic Metabolic Panel: Recent Labs  Lab 09/02/22 0100 09/02/22 0658 09/03/22 0328  NA 136 137 138  K 3.3* 3.7 4.1  CL 107 107 105  CO2 22 21* 25  GLUCOSE 100* 80 83  BUN 10 8 14   CREATININE 1.14 1.16 1.32*  CALCIUM 9.1 9.1 9.6  MG 2.2 2.1 2.4   Liver Function Tests: Recent Labs  Lab 09/02/22 0100 09/03/22 0328  AST 23 22  ALT 25 24  ALKPHOS 55 52  BILITOT 0.8 0.7  PROT 8.2* 8.4*  ALBUMIN 4.8 4.7   No results for input(s): "LIPASE", "AMYLASE" in the last 168 hours. No results for input(s): "AMMONIA" in the last 168 hours. CBC: Recent Labs  Lab 09/02/22 0100 09/02/22 0658 09/03/22 0328  WBC 6.0 5.6 7.2  NEUTROABS 3.6  --   --   HGB 15.1 15.5 16.4  HCT 44.4 46.5 49.6  MCV 91.9 93.4 94.8  PLT 238  219 204   Cardiac Enzymes: No results for input(s): "CKTOTAL", "CKMB", "CKMBINDEX", "TROPONINI" in the last 168 hours. BNP: Invalid input(s): "POCBNP" CBG: No results for input(s): "GLUCAP" in the last 168 hours. D-Dimer No results for input(s): "DDIMER" in the last 72 hours. Hgb A1c No results for input(s): "HGBA1C" in the last 72 hours. Lipid Profile No results for input(s): "CHOL", "HDL", "LDLCALC", "TRIG", "CHOLHDL", "LDLDIRECT" in the last 72 hours. Thyroid function studies No results for input(s): "TSH", "T4TOTAL", "T3FREE", "THYROIDAB" in the last 72 hours.  Invalid input(s): "FREET3" Anemia work up No results for input(s): "VITAMINB12", "FOLATE", "FERRITIN", "TIBC", "IRON", "RETICCTPCT" in the last 72 hours. Urinalysis    Component Value Date/Time   BILIRUBINUR negative 03/07/2021 0848   KETONESUR negative 03/07/2021 0848   PROTEINUR negative 05/25/2020 0954   UROBILINOGEN 0.2 03/07/2021 0848   NITRITE Negative 03/07/2021 0848   LEUKOCYTESUR Negative 03/07/2021 0848   Sepsis Labs Recent Labs  Lab 09/02/22 0100 09/02/22 0658 09/03/22 0328  WBC 6.0 5.6 7.2   Microbiology No results found for this or any previous visit (from the past 240 hour(s)).  Time coordinating discharge: 35 minutes  SIGNED:   Glade Lloyd, MD  [facility] 09/03/2022, 7:38 AM
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
## 2022-09-03 NOTE — Progress Notes (Signed)
Patient discharging to [facility] - report called to facility.  AVS printed and placed in packet.  Med from pharmacy retrieved and placed with patient belonging to be sent when transport arrives.  Patient in NAD at this time.

## 2022-09-03 NOTE — TOC Initial Note (Addendum)
Transition of Care [facility]) - Initial/Assessment Note    Patient Details  Name: Christopher Forbes MRN: [id number] Date of Birth: 2000-08-28  Transition of Care [facility]) CM/SW Contact:    Larrie Kass, LCSW Phone Number: 09/03/2022, 10:31 AM  Clinical Narrative:                 Pt IVC has been completed, from 09/03/22- 09/09/22, [employer] as been called to server pt. Per Dr Rex Kras, pt meet IP psych criteria. No available beds at [facility], pt will be faxed out for IP psych. TOC to follow.   Expected Discharge Plan: Psychiatric Hospital Barriers to Discharge: Psych Bed not available   Patient Goals and CMS Choice            Expected Discharge Plan and Services In-house Referral: Clinical Social Work     Living arrangements for the past 2 months: Single Family Home Expected Discharge Date: 09/03/22                                    Prior Living Arrangements/Services Living arrangements for the past 2 months: Single Family Home Lives with:: Self                   Activities of Daily Living Home Assistive Devices/Equipment: None ADL Screening (condition at time of admission) Patient's cognitive ability adequate to safely complete daily activities?: Yes Is the patient deaf or have difficulty hearing?: No Does the patient have difficulty seeing, even when wearing glasses/contacts?: No Does the patient have difficulty concentrating, remembering, or making decisions?: No Patient able to express need for assistance with ADLs?: Yes Does the patient have difficulty dressing or bathing?: No Independently performs ADLs?: Yes (appropriate for developmental age) Does the patient have difficulty walking or climbing stairs?: No Weakness of Legs: Both Weakness of Arms/Hands: Both  Permission Sought/Granted                  Emotional Assessment           Psych Involvement: Yes (comment)  Admission diagnosis:  Suicide attempt (HCC) [T14.91XA] Intentional  overdose (HCC) [T50.902A] Intentional overdose, initial encounter (HCC) [T50.902A] Patient Active Problem List   Diagnosis Date Noted   Intentional overdose (HCC) 09/02/2022   Hypokalemia 09/02/2022   Migraine 08/22/2022   Acute pain of left knee 09/06/2021   Bronchitis 09/06/2021   Learning disability 06/06/2021   Frequent headaches 04/06/2021   Anxiety state 04/06/2021   Chest pain 03/22/2021   Polyarthritis with positive rheumatoid factor (HCC) 09/25/2020   Diarrhea 05/30/2020   Elevated TSH 05/30/2020   Nausea and vomiting 05/30/2020   Well adult exam 05/02/2020   Back pain of thoracolumbar region 12/15/2019   Patellofemoral arthralgia of left knee 12/15/2019   First degree ankle sprain, left, initial encounter 12/15/2019   Current mild episode of major depressive disorder without prior episode (HCC) 02/16/2019   Egg allergy 08/29/2016   PCP:  Everrett Coombe, DO Pharmacy:   [facility] - [place], [place] - [address] [place] [address] Phone: [phone] Fax: [phone]  [place] - [facility] [address] [place] [address] Phone: [phone] Fax: [phone]  [id number] - [place], [place] - [address] AT [address]NER OF [facility] [address] [place] [address] Phone: [phone] Fax: [phone]  [facility] [phone] - [place], [place] -  [address] [place] [address] Phone: [phone] Fax: [phone]  [facility] - [place], [place] - [address] [place] [address] Phone: [phone] Fax: [phone]     Social Determinants of Health (SDOH) Social History: SDOH Screenings   Food Insecurity: No Food Insecurity (09/02/2022)  Recent Concern: Food Insecurity - Food Insecurity Present (08/13/2022)  Housing: Low Risk  (09/02/2022)  Recent Concern: Housing - High Risk  (08/13/2022)  Transportation Needs: No Transportation Needs (09/02/2022)  Utilities: Not At Risk (09/02/2022)  Depression (PHQ2-9): Medium Risk (09/06/2021)  Financial Resource Strain: High Risk (08/13/2022)  Physical Activity: Insufficiently Active (08/13/2022)  Social Connections: Unknown (08/13/2022)  Stress: Stress Concern Present (08/13/2022)  Tobacco Use: Low Risk  (09/02/2022)   SDOH Interventions:     Readmission Risk Interventions     No data to display

## 2022-09-04 ENCOUNTER — Ambulatory Visit: Payer: Medicaid Other | Admitting: Sports Medicine

## 2022-09-14 ENCOUNTER — Other Ambulatory Visit: Payer: Self-pay | Admitting: Family Medicine

## 2022-09-21 ENCOUNTER — Ambulatory Visit (INDEPENDENT_AMBULATORY_CARE_PROVIDER_SITE_OTHER): Payer: Medicaid Other | Admitting: Sports Medicine

## 2022-09-21 ENCOUNTER — Other Ambulatory Visit (INDEPENDENT_AMBULATORY_CARE_PROVIDER_SITE_OTHER): Payer: Medicaid Other

## 2022-09-21 DIAGNOSIS — M25562 Pain in left knee: Secondary | ICD-10-CM | POA: Diagnosis not present

## 2022-09-21 NOTE — Assessment & Plan Note (Signed)
This is a 44 old male, on I saw him in the summertime of 2023 for some left knee pain, anterior, x-rays were negative at the time, Voltaren, PT added, he did this consistently 2-3 times a week. Unfortunately he started to have recurrence of pain, he tells me he had continued to do his conditioning without sufficient improvement, hinged knee brace was not effective, he was then incarcerated for a period of time, had an injury in the front of his knee, ultimately MRI showed edema Hoffa's fat pad consistent with patellar friction syndrome. I injected his knee today, continue conditioning and return to see me in 6 weeks.

## 2022-09-21 NOTE — Progress Notes (Signed)
    Procedures performed today:    Procedure: Real-time Ultrasound Guided injection of the left knee Device: Samsung HS60  Verbal informed consent obtained.  Time-out conducted.  Noted no overlying erythema, induration, or other signs of local infection.  Skin prepped in a sterile fashion.  Local anesthesia: Topical Ethyl chloride.  With sterile technique and under real time ultrasound guidance: No effusion noted, 1 cc Kenalog 40, 2 cc lidocaine, 2 cc bupivacaine injected easily Completed without difficulty  Advised to call if fevers/chills, erythema, induration, drainage, or persistent bleeding.  Images permanently stored and available for review in PACS.  Impression: Technically successful ultrasound guided injection.  Independent interpretation of notes and tests performed by another provider:   None.  Brief History, Exam, Impression, and Recommendations:    Patellofemoral arthralgia of left knee This is a 77 old male, on I saw him in the summertime of 2023 for some left knee pain, anterior, x-rays were negative at the time, Voltaren, PT added, he did this consistently 2-3 times a week. Unfortunately he started to have recurrence of pain, he tells me he had continued to do his conditioning without sufficient improvement, hinged knee brace was not effective, he was then incarcerated for a period of time, had an injury in the front of his knee, ultimately MRI showed edema Hoffa's fat pad consistent with patellar friction syndrome. I injected his knee today, continue conditioning and return to see me in 6 weeks.    ____________________________________________ Ihor Austin. Benjamin Stain, M.D., ABFM., CAQSM., AME. Primary Care and Sports Medicine [facility]  Adjunct Professor of Family Medicine  [employer] [employer]  Restaurant manager, fast food

## 2022-09-25 ENCOUNTER — Other Ambulatory Visit: Payer: Self-pay | Admitting: Family Medicine

## 2022-10-04 ENCOUNTER — Other Ambulatory Visit: Payer: Self-pay | Admitting: Family Medicine

## 2022-10-06 ENCOUNTER — Other Ambulatory Visit: Payer: Self-pay | Admitting: Family Medicine

## 2022-10-06 DIAGNOSIS — G43809 Other migraine, not intractable, without status migrainosus: Secondary | ICD-10-CM

## 2022-10-09 ENCOUNTER — Ambulatory Visit (INDEPENDENT_AMBULATORY_CARE_PROVIDER_SITE_OTHER): Payer: Medicaid Other | Admitting: Family Medicine

## 2022-10-09 ENCOUNTER — Encounter: Payer: Self-pay | Admitting: Family Medicine

## 2022-10-09 VITALS — BP 111/76 | HR 83 | Ht 70.0 in | Wt 166.0 lb

## 2022-10-09 DIAGNOSIS — Z23 Encounter for immunization: Secondary | ICD-10-CM

## 2022-10-09 DIAGNOSIS — F320 Major depressive disorder, single episode, mild: Secondary | ICD-10-CM

## 2022-10-09 DIAGNOSIS — R519 Headache, unspecified: Secondary | ICD-10-CM

## 2022-10-09 MED ORDER — DOXEPIN HCL 10 MG PO CAPS: 10.0000 mg | ORAL_CAPSULE | Freq: Every evening | ORAL | 1 refills | Status: DC | PRN

## 2022-10-09 NOTE — Patient Instructions (Addendum)
Try doxepin at bedtime to help with sleep.   Be sure to take omeprazole daily.   See me again in about 4-6 weeks.

## 2022-10-10 NOTE — Assessment & Plan Note (Signed)
Recent suicide attempt.  Reports he is doing pretty well at this time.  He is seeing psychiatry for management of medication as well as a therapist.  Does continue to have some insomnia.  Adding doxepin on to help with his insomnia

## 2022-10-10 NOTE — Progress Notes (Signed)
Christopher Forbes - 22 y.o. male MRN [id number]  Date of birth: [birth year]  Subjective Chief Complaint  Patient presents with   Headache   Insomnia    HPI Christopher Forbes is a 22 year old male here today for follow-up visit.    He was admitted last month for suicide attempt after intentionally overdosing on Lexapro.  Discharge when stable and has had follow-up with therapist as well as psychiatrist.  He has remained on Lexapro and bupropion was added.  He reports that he feels okay at this time.  He continues to have migraines.  Topiramate has been helpful to some degree.  He was on Elavil as well to help with migraines as well as for insomnia.  This was discontinued during his hospitalization he does continue to have some difficulty with sleep which seem to exacerbate his migraines as well.  He does have an appointment with neurology to address his continued headaches.  ROS:  A comprehensive ROS was completed and negative except as noted per HPI  Allergies  Allergen Reactions   Egg-Derived Products Nausea And Vomiting   No Healthtouch Food Allergies Nausea And Vomiting    Chicken, Milk    Past Medical History:  Diagnosis Date   Acne    Allergy    Anxiety    Asthma    Chronic headaches    Depression    GERD (gastroesophageal reflux disease)     Past Surgical History:  Procedure Laterality Date   OPEN REDUCTION INTERNAL FIXATION (ORIF) METACARPAL Right 11/19/2018   Procedure: Right small finger metacarpal open malunion repair;  Surgeon: Bradly Bienenstock, MD;  Location: [facility];  Service: Orthopedics;  Laterality: Right;  90 minutes   TYMPANOSTOMY TUBE PLACEMENT Bilateral    placed age 22 have fallen out   UPPER GASTROINTESTINAL ENDOSCOPY     WISDOM TOOTH EXTRACTION  04/04/2021    Social History   Socioeconomic History   Marital status: Single    Spouse name: Not on file   Number of children: Not on file   Years of education: Not on file   Highest  education level: Not on file  Occupational History   Occupation: student  Tobacco Use   Smoking status: Never   Smokeless tobacco: Never  Vaping Use   Vaping status: Former  Substance and Sexual Activity   Alcohol use: No    Alcohol/week: 0.0 standard drinks of alcohol   Drug use: No   Sexual activity: Yes    Partners: Male  Other Topics Concern   Not on file  Social History Narrative   Patient lives with brother step sister and step dad Transport planner. Mom is involved.   Social Determinants of Health   Financial Resource Strain: High Risk (08/13/2022)   Overall Financial Resource Strain (CARDIA)    Difficulty of Paying Living Expenses: Very hard  Food Insecurity: No Food Insecurity (09/02/2022)   Hunger Vital Sign    Worried About Running Out of Food in the Last Year: Never true    Ran Out of Food in the Last Year: Never true  Recent Concern: Food Insecurity - Food Insecurity Present (08/13/2022)   Hunger Vital Sign    Worried About Running Out of Food in the Last Year: Sometimes true    Ran Out of Food in the Last Year: Sometimes true  Transportation Needs: No Transportation Needs (09/02/2022)   PRAPARE - Administrator, Civil Service (Medical): No    Lack of Transportation (Non-Medical):  No  Physical Activity: Insufficiently Active (08/13/2022)   Exercise Vital Sign    Days of Exercise per Week: 2 days    Minutes of Exercise per Session: 20 min  Stress: Stress Concern Present (08/13/2022)   [employer] of Occupational Health - Occupational Stress Questionnaire    Feeling of Stress : To some extent  Social Connections: Unknown (08/13/2022)   Social Connection and Isolation Panel [NHANES]    Frequency of Communication with Friends and Family: Never    Frequency of Social Gatherings with Friends and Family: Patient declined    Attends Religious Services: Patient declined    Database administrator or Organizations: No    Attends Engineer, structural: Not  on file    Marital Status: Never married    Family History  Problem Relation Age of Onset   Diabetes Father    Hyperlipidemia Father    Hypertension Father    COPD Father    Asthma Father    Colon cancer Neg Hx    Esophageal cancer Neg Hx    Rectal cancer Neg Hx    Stomach cancer Neg Hx     Health Maintenance  Topic Date Due   COVID-19 Vaccine (3 - 2023-24 season) 10/25/2022 (Originally 11/17/2021)   Hepatitis C Screening  10/09/2023 (Originally 12/28/2019)   DTaP/Tdap/Td (8 - Td or Tdap) 10/08/2032   HPV VACCINES  Completed   HIV Screening  Completed     ----------------------------------------------------------------------------------------------------------------------------------------------------------------------------------------------------------------- Physical Exam BP 111/76 (BP Location: Right Arm, Patient Position: Sitting, Cuff Size: Normal)   Pulse 83   Ht 5\' 10"  (1.778 m)   Wt 166 lb (75.3 kg)   SpO2 99%   BMI 23.82 kg/m   Physical Exam Constitutional:      Appearance: Normal appearance.  Eyes:     General: No scleral icterus. Cardiovascular:     Rate and Rhythm: Normal rate and regular rhythm.  Pulmonary:     Effort: Pulmonary effort is normal.     Breath sounds: Normal breath sounds.  Neurological:     Mental Status: He is alert.  Psychiatric:        Mood and Affect: Mood normal.        Behavior: Behavior normal.     ------------------------------------------------------------------------------------------------------------------------------------------------------------------------------------------------------------------- Assessment and Plan  Current mild episode of major depressive disorder without prior episode (HCC) Recent suicide attempt.  Reports he is doing pretty well at this time.  He is seeing psychiatry for management of medication as well as a therapist.  Does continue to have some insomnia.  Adding doxepin on to help with his  insomnia  Frequent headaches He does have an appointment with neurology.  Will continue topiramate with Maxalt as needed.   Meds ordered this encounter  Medications   doxepin (SINEQUAN) 10 MG capsule    Sig: Take 1-2 capsules (10-20 mg total) by mouth at bedtime as needed.    Dispense:  90 capsule    Refill:  1    Return in about 6 weeks (around 11/20/2022) for F/u Insomnia/migraines/nausea.

## 2022-10-10 NOTE — Assessment & Plan Note (Signed)
He does have an appointment with neurology.  Will continue topiramate with Maxalt as needed.

## 2022-10-19 ENCOUNTER — Other Ambulatory Visit: Payer: Self-pay | Admitting: Family Medicine
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
## 2022-10-29 ENCOUNTER — Ambulatory Visit (INDEPENDENT_AMBULATORY_CARE_PROVIDER_SITE_OTHER): Payer: Medicaid Other | Admitting: Neurology

## 2022-10-29 ENCOUNTER — Encounter: Payer: Self-pay | Admitting: Neurology

## 2022-10-29 VITALS — BP 105/73 | HR 84 | Ht 70.0 in | Wt 168.8 lb

## 2022-10-29 DIAGNOSIS — G43709 Chronic migraine without aura, not intractable, without status migrainosus: Secondary | ICD-10-CM | POA: Diagnosis not present

## 2022-10-29 MED ORDER — AIMOVIG 140 MG/ML ~~LOC~~ SOAJ
140.0000 mg | SUBCUTANEOUS | 11 refills | Status: DC
  Filled 2023-04-26 – 2023-05-15 (×3): qty 1, 28d supply, fill #0

## 2022-10-29 MED ORDER — SUMATRIPTAN SUCCINATE 50 MG PO TABS: 50.0000 mg | ORAL_TABLET | ORAL | 5 refills | Status: DC | PRN

## 2022-10-29 NOTE — Patient Instructions (Signed)
  Start Aimovig 140mg  every 28 days.  Contact us in 4 weeks with update and we can increase dose if needed. Take sumatriptan 50mg  at earliest onset of headache.  May repeat dose once in 2 hours if needed.  Maximum 2 tablets in 24 hours.  If ineffective, contact me and we can try a different medication.  Ultimately, if rizatriptan is most effective, then continue that Limit use of pain relievers to no more than 2 days out of the week.  These medications include acetaminophen, NSAIDs (ibuprofen/Advil/Motrin, naproxen/Aleve, triptans (Imitrex/sumatriptan), Excedrin, and narcotics.  This will help reduce risk of rebound headaches. Be aware of common food triggers Routine exercise Stay adequately hydrated (aim for 64 oz water daily) Keep headache diary Maintain proper stress management Maintain proper sleep hygiene Do not skip meals Consider supplements:  magnesium citrate 400mg  daily, riboflavin 400mg  daily, coenzyme Q10 300mg  daily.

## 2022-11-01 ENCOUNTER — Ambulatory Visit (INDEPENDENT_AMBULATORY_CARE_PROVIDER_SITE_OTHER): Payer: Medicaid Other | Admitting: Sports Medicine

## 2022-11-01 DIAGNOSIS — M25562 Pain in left knee: Secondary | ICD-10-CM | POA: Diagnosis not present

## 2022-11-01 DIAGNOSIS — S93492A Sprain of other ligament of left ankle, initial encounter: Secondary | ICD-10-CM | POA: Diagnosis not present

## 2022-11-01 DIAGNOSIS — S93402A Sprain of unspecified ligament of left ankle, initial encounter: Secondary | ICD-10-CM | POA: Insufficient documentation
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
## 2022-11-01 NOTE — Assessment & Plan Note (Signed)
Recent lateral ankle sprain, negative [place] rules, ankle is stable with full motion and full strength, ASO, home conditioning, return as needed.
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
## 2022-11-02 ENCOUNTER — Ambulatory Visit: Payer: Medicaid Other | Admitting: Sports Medicine

## 2022-11-12 ENCOUNTER — Encounter: Payer: Self-pay | Admitting: Neurology

## 2022-11-12 ENCOUNTER — Telehealth: Payer: Self-pay

## 2022-11-12 NOTE — Telephone Encounter (Signed)
PA needed for Aimovig.

## 2022-11-14 ENCOUNTER — Other Ambulatory Visit (HOSPITAL_COMMUNITY): Payer: Self-pay

## 2022-11-14 ENCOUNTER — Telehealth: Payer: Self-pay

## 2022-11-14 NOTE — Telephone Encounter (Signed)
Pharmacy Patient Advocate Encounter   Received notification from Pt Calls Messages that prior authorization for Aimovig 140MG /ML auto-injectors is required/requested.   Insurance verification completed.   The patient is insured through [facility] .   Per test claim: PA required; PA started via CoverMyMeds. KEY [id number] . Waiting for clinical questions to populate.

## 2022-11-14 NOTE — Telephone Encounter (Signed)
 Pharmacy Patient Advocate Encounter  Questions generated, answered, and submitted

## 2022-11-14 NOTE — Telephone Encounter (Signed)
PA request has been Submitted. New Encounter created for follow up. For additional info see Pharmacy Prior Auth telephone encounter from 11-14-2022.

## 2022-11-16 ENCOUNTER — Other Ambulatory Visit (HOSPITAL_COMMUNITY): Payer: Self-pay

## 2022-11-16 ENCOUNTER — Other Ambulatory Visit: Payer: Self-pay | Admitting: Family Medicine

## 2022-11-16 NOTE — Telephone Encounter (Signed)
Pharmacy Patient Advocate Encounter  Received notification from [facility] that Prior Authorization for Aimovig 140MG /ML auto-injectors has been APPROVED from 11-14-2022 to 02-14-2023. Ran test claim, Copay is $0.00. This test claim was processed through [facility]- copay amounts may vary at other pharmacies due to pharmacy/plan contracts, or as the patient moves through the different stages of their insurance plan.   PA #/Case ID/Reference #: [id number]

## 2022-11-20 ENCOUNTER — Ambulatory Visit (INDEPENDENT_AMBULATORY_CARE_PROVIDER_SITE_OTHER): Payer: Medicaid Other | Admitting: Family Medicine

## 2022-11-20 ENCOUNTER — Encounter: Payer: Self-pay | Admitting: Family Medicine

## 2022-11-20 VITALS — BP 121/80 | HR 83 | Ht 70.0 in | Wt 162.0 lb

## 2022-11-20 DIAGNOSIS — G43819 Other migraine, intractable, without status migrainosus: Secondary | ICD-10-CM | POA: Diagnosis not present

## 2022-11-20 DIAGNOSIS — F320 Major depressive disorder, single episode, mild: Secondary | ICD-10-CM

## 2022-11-20 DIAGNOSIS — G4700 Insomnia, unspecified: Secondary | ICD-10-CM | POA: Insufficient documentation

## 2022-11-20 DIAGNOSIS — G4709 Other insomnia: Secondary | ICD-10-CM | POA: Diagnosis not present

## 2022-11-20 NOTE — Assessment & Plan Note (Signed)
Management per psychiatry.  Continues on Lexapro and bupropion.

## 2022-11-20 NOTE — Assessment & Plan Note (Signed)
He is sleeping better with doxepin.  Will plan to continue at current strength.

## 2022-11-20 NOTE — Assessment & Plan Note (Signed)
He has been seeing neurology.  Planning on starting Aimovig soon.  He will continue topiramate and Imitrex as needed.

## 2022-11-20 NOTE — Progress Notes (Signed)
Christopher Forbes - 22 y.o. male MRN [id number]  Date of birth: [birth year]  Subjective Chief Complaint  Patient presents with   Headache   Insomnia   Fatigue    HPI Christopher Forbes is a 22 year old male here today for follow-up visit.  Overall he is doing pretty well.  Seeing neurology and will be starting Aimovig injections soon for his chronic migraines.  He is sleeping better with doxepin.  Little more tired throughout the day.  He does remain on topiramate and Imitrex for now.  He is seeing psychiatry for management of antidepressants.  Remains on Lexapro and Wellbutrin.  ROS:  A comprehensive ROS was completed and negative except as noted per HPI    Allergies  Allergen Reactions   Egg-Derived Products Nausea And Vomiting   No [employer] Food Allergies Nausea And Vomiting    Chicken, Milk    Past Medical History:  Diagnosis Date   Acne    Allergy    Anxiety    Asthma    Chronic headaches    Depression    GERD (gastroesophageal reflux disease)     Past Surgical History:  Procedure Laterality Date   OPEN REDUCTION INTERNAL FIXATION (ORIF) METACARPAL Right 11/19/2018   Procedure: Right small finger metacarpal open malunion repair;  Surgeon: Bradly Bienenstock, MD;  Location: [facility];  Service: Orthopedics;  Laterality: Right;  90 minutes   TYMPANOSTOMY TUBE PLACEMENT Bilateral    placed age 35 have fallen out   UPPER GASTROINTESTINAL ENDOSCOPY     WISDOM TOOTH EXTRACTION  04/04/2021    Social History   Socioeconomic History   Marital status: Single    Spouse name: Not on file   Number of children: Not on file   Years of education: Not on file   Highest education level: Not on file  Occupational History   Occupation: student  Tobacco Use   Smoking status: Never   Smokeless tobacco: Never  Vaping Use   Vaping status: Former  Substance and Sexual Activity   Alcohol use: No    Alcohol/week: 0.0 standard drinks of alcohol   Drug use: No    Sexual activity: Yes    Partners: Male  Other Topics Concern   Not on file  Social History Narrative   Patient lives with brother step sister and step dad Transport planner. Mom is involved. Right handed   Social Determinants of Health   Financial Resource Strain: High Risk (08/13/2022)   Overall Financial Resource Strain (CARDIA)    Difficulty of Paying Living Expenses: Very hard  Food Insecurity: No Food Insecurity (09/02/2022)   Hunger Vital Sign    Worried About Running Out of Food in the Last Year: Never true    Ran Out of Food in the Last Year: Never true  Recent Concern: Food Insecurity - Food Insecurity Present (08/13/2022)   Hunger Vital Sign    Worried About Running Out of Food in the Last Year: Sometimes true    Ran Out of Food in the Last Year: Sometimes true  Transportation Needs: No Transportation Needs (09/02/2022)   PRAPARE - Administrator, Civil Service (Medical): No    Lack of Transportation (Non-Medical): No  Physical Activity: Insufficiently Active (08/13/2022)   Exercise Vital Sign    Days of Exercise per Week: 2 days    Minutes of Exercise per Session: 20 min  Stress: Stress Concern Present (08/13/2022)   [employer] of Occupational Health - Occupational Stress Questionnaire  Feeling of Stress : To some extent  Social Connections: Unknown (08/13/2022)   Social Connection and Isolation Panel [NHANES]    Frequency of Communication with Friends and Family: Never    Frequency of Social Gatherings with Friends and Family: Patient declined    Attends Religious Services: Patient declined    Database administrator or Organizations: No    Attends Engineer, structural: Not on file    Marital Status: Never married    Family History  Problem Relation Age of Onset   Migraines Mother    Diabetes Father    Hyperlipidemia Father    Hypertension Father    COPD Father    Asthma Father    Migraines Brother    Stroke Maternal Grandmother    Colon  cancer Neg Hx    Esophageal cancer Neg Hx    Rectal cancer Neg Hx    Stomach cancer Neg Hx     Health Maintenance  Topic Date Due   COVID-19 Vaccine (3 - 2023-24 season) 03/07/2023 (Originally 11/18/2022)   Hepatitis C Screening  10/09/2023 (Originally 12/28/2019)   DTaP/Tdap/Td (8 - Td or Tdap) 10/08/2032   HPV VACCINES  Completed   HIV Screening  Completed     ----------------------------------------------------------------------------------------------------------------------------------------------------------------------------------------------------------------- Physical Exam BP 121/80 (BP Location: Right Arm, Patient Position: Sitting, Cuff Size: Normal)   Pulse 83   Ht 5\' 10"  (1.778 m)   Wt 162 lb (73.5 kg)   SpO2 99%   BMI 23.24 kg/m   Physical Exam Constitutional:      Appearance: Normal appearance. He is well-developed.  Eyes:     General: No scleral icterus. Cardiovascular:     Rate and Rhythm: Normal rate and regular rhythm.  Pulmonary:     Effort: Pulmonary effort is normal.     Breath sounds: Normal breath sounds.  Musculoskeletal:     Cervical back: Neck supple.  Neurological:     Mental Status: He is alert.  Psychiatric:        Mood and Affect: Mood normal.        Behavior: Behavior normal.     ------------------------------------------------------------------------------------------------------------------------------------------------------------------------------------------------------------------- Assessment and Plan  Current mild episode of major depressive disorder without prior episode (HCC) Management per psychiatry.  Continues on Lexapro and bupropion.  Migraine He has been seeing neurology.  Planning on starting Aimovig soon.  He will continue topiramate and Imitrex as needed.    Insomnia He is sleeping better with doxepin.  Will plan to continue at current strength.   No orders of the defined types were placed in this  encounter.   Return in about 4 months (around 03/22/2023) for F/u Migraines/Sleep.    This visit occurred during the SARS-CoV-2 public health emergency.  Safety protocols were in place, including screening questions prior to the visit, additional usage of staff PPE, and extensive cleaning of exam room while observing appropriate contact time as indicated for disinfecting solutions.

## 2022-11-22 ENCOUNTER — Encounter: Payer: Self-pay | Admitting: Neurology

## 2022-11-24 ENCOUNTER — Other Ambulatory Visit: Payer: Self-pay | Admitting: Family Medicine

## 2022-11-24 DIAGNOSIS — G43809 Other migraine, not intractable, without status migrainosus: Secondary | ICD-10-CM

## 2022-12-01 ENCOUNTER — Other Ambulatory Visit: Payer: Self-pay

## 2022-12-01 ENCOUNTER — Ambulatory Visit
Admission: EM | Admit: 2022-12-01 | Discharge: 2022-12-01 | Disposition: A | Discharge status: Home / Self Care | Payer: Medicaid Other | Attending: Family Medicine | Admitting: Family Medicine

## 2022-12-01 DIAGNOSIS — M7918 Myalgia, other site: Secondary | ICD-10-CM

## 2022-12-01 DIAGNOSIS — J069 Acute upper respiratory infection, unspecified: Secondary | ICD-10-CM | POA: Diagnosis not present

## 2022-12-01 LAB — POCT URINALYSIS DIP (MANUAL ENTRY)
Bilirubin, UA: NEGATIVE
Blood, UA: NEGATIVE
Glucose, UA: NEGATIVE mg/dL
Ketones, POC UA: NEGATIVE mg/dL
Leukocytes, UA: NEGATIVE
Nitrite, UA: NEGATIVE
Protein Ur, POC: NEGATIVE mg/dL
Spec Grav, UA: >=1.03 — AB (ref 1.010–1.025)
Urobilinogen, UA: 0.2 U/dL
pH, UA: 5.5 (ref 5.0–8.0)

## 2022-12-01 MED ORDER — IBUPROFEN 600 MG PO TABS: 600.0000 mg | ORAL_TABLET | Freq: Four times a day (QID) | ORAL | 0 refills | Status: DC | PRN

## 2022-12-01 MED ORDER — CEPHALEXIN 500 MG PO CAPS: 500.0000 mg | ORAL_CAPSULE | Freq: Two times a day (BID) | ORAL | 0 refills | Status: DC

## 2022-12-01 NOTE — ED Triage Notes (Addendum)
Cough, sinus pressure, left ear pressure and left flank pain x 3 days.  No urinary c/o.

## 2022-12-01 NOTE — Discharge Instructions (Signed)
Make sure that  you are drinking lots of water Take the antibiotic 2 times a day May take ibuprofen 3 times a day with food.  This will help with your back pain Return if needed

## 2022-12-01 NOTE — ED Provider Notes (Addendum)
[facility]    CSN: [id number] Arrival date & time: 12/01/22  1209      History   Chief Complaint Chief Complaint  Patient presents with   Cough    HPI Christopher Forbes is a 22 y.o. male.   Christopher Forbes states that there is a cold going around his family.  He has some runny stuffy nose, sore throat, sinus congestion, and ear pain.  He has concerns because she has had ear tubes in the past.  States his left ear is painful.  He also has pain in his left flank.  Hurts with movement.  He states that he is not having any hematuria or urinary symptoms.  Expresses concern because he has had a kidney stone in the past.  Denies fever, headaches, body aches, or exposure to COVID    Past Medical History:  Diagnosis Date   Acne    Allergy    Anxiety    Asthma    Chronic headaches    Depression    GERD (gastroesophageal reflux disease)     Patient Active Problem List   Diagnosis Date Noted   Insomnia 11/20/2022   Left ankle sprain 11/01/2022   Intentional overdose (HCC) 09/02/2022   Hypokalemia 09/02/2022   Migraine 08/22/2022   Learning disability 06/06/2021   Frequent headaches 04/06/2021   Anxiety state 04/06/2021   Chest pain 03/22/2021   Polyarthritis with positive rheumatoid factor (HCC) 09/25/2020   Diarrhea 05/30/2020   Elevated TSH 05/30/2020   Nausea and vomiting 05/30/2020   Well adult exam 05/02/2020   Back pain of thoracolumbar region 12/15/2019   Patellofemoral arthralgia of left knee 12/15/2019   First degree ankle sprain, left, initial encounter 12/15/2019   Current mild episode of major depressive disorder without prior episode (HCC) 02/16/2019   Egg allergy 08/29/2016    Past Surgical History:  Procedure Laterality Date   OPEN REDUCTION INTERNAL FIXATION (ORIF) METACARPAL Right 11/19/2018   Procedure: Right small finger metacarpal open malunion repair;  Surgeon: Bradly Bienenstock, MD;  Location: [facility];  Service: Orthopedics;   Laterality: Right;  90 minutes   TYMPANOSTOMY TUBE PLACEMENT Bilateral    placed age 45 have fallen out   UPPER GASTROINTESTINAL ENDOSCOPY     WISDOM TOOTH EXTRACTION  04/04/2021       Home Medications    Prior to Admission medications   Medication Sig Start Date End Date Taking? Authorizing Provider  cephALEXin (KEFLEX) 500 MG capsule Take 1 capsule (500 mg total) by mouth 2 (two) times daily. 12/01/22  Yes Eustace Moore, MD  ibuprofen (ADVIL) 600 MG tablet Take 1 tablet (600 mg total) by mouth every 6 (six) hours as needed. 12/01/22  Yes Eustace Moore, MD  albuterol (VENTOLIN HFA) 108 (90 Base) MCG/ACT inhaler Inhale 2 puffs into the lungs every 4 (four) hours as needed for wheezing or shortness of breath. 09/03/22   Glade Lloyd, MD  buPROPion (WELLBUTRIN XL) 300 MG 24 hr tablet Take 300 mg by mouth daily. 09/11/22   [provider]  doxepin (SINEQUAN) 10 MG capsule TAKE 1-2 CAPSULES (10-20 MG TOTAL) BY MOUTH AT BEDTIME AS NEEDED. 11/20/22   Everrett Coombe, DO  Erenumab-aooe (AIMOVIG) 140 MG/ML SOAJ Inject 140 mg into the skin every 28 (twenty-eight) days. 10/29/22   Everlena Cooper, Adam R, DO  escitalopram (LEXAPRO) 20 MG tablet Take 20 mg by mouth daily. 05/18/22   [provider]  omeprazole (PRILOSEC) 40 MG capsule TAKE 1 CAPSULE  BY MOUTH IN THE MORNING AND AT BEDTIME 09/18/22   Everrett Coombe, DO  ondansetron (ZOFRAN-ODT) 4 MG disintegrating tablet TAKE 1 TABLET BY MOUTH EVERY 8 HOURS AS NEEDED FOR NAUSEA AND VOMITING 10/19/22   Everrett Coombe, DO  rizatriptan (MAXALT-MLT) 10 MG disintegrating tablet TAKE 1 TABLET BY MOUTH AS NEEDED FOR MIGRAINE. MAY REPEAT IN 2 HOURS IF NEEDED 09/18/22   Everrett Coombe, DO  Spacer/Aero-Holding Chambers [facility] DIAMOND) MISC  04/04/20   [provider]  topiramate (TOPAMAX) 50 MG tablet TAKE 1 TABLET BY MOUTH TWICE A DAY 11/26/22   Everrett Coombe, DO    Family History Family History  Problem Relation Age of Onset   Migraines  Mother    Diabetes Father    Hyperlipidemia Father    Hypertension Father    COPD Father    Asthma Father    Migraines Brother    Stroke Maternal Grandmother    Colon cancer Neg Hx    Esophageal cancer Neg Hx    Rectal cancer Neg Hx    Stomach cancer Neg Hx     Social History Social History   Tobacco Use   Smoking status: Never   Smokeless tobacco: Never  Vaping Use   Vaping status: Former  Substance Use Topics   Alcohol use: No    Alcohol/week: 0.0 standard drinks of alcohol   Drug use: No     Allergies   Egg-derived products   Review of Systems Review of Systems See HPI  Physical Exam Updated Vital Signs BP 123/75 (BP Location: Left Arm)   Pulse 73   Temp 98.5 F (36.9 C) (Oral)   Resp 16   SpO2 99%      Physical Exam Constitutional:      General: He is not in acute distress.    Appearance: He is well-developed.     Comments: Appears tired  HENT:     Head: Normocephalic and atraumatic.     Right Ear: Tympanic membrane and ear canal normal.     Left Ear: Ear canal normal.     Ears:     Comments: Left TM is injected    Nose: Congestion present.     Mouth/Throat:     Mouth: Mucous membranes are moist.     Pharynx: No posterior oropharyngeal erythema.  Eyes:     Conjunctiva/sclera: Conjunctivae normal.     Pupils: Pupils are equal, round, and reactive to light.  Cardiovascular:     Rate and Rhythm: Normal rate and regular rhythm.     Heart sounds: Normal heart sounds.  Pulmonary:     Effort: Pulmonary effort is normal. No respiratory distress.     Breath sounds: Normal breath sounds.  Abdominal:     General: There is no distension.     Palpations: Abdomen is soft.     Tenderness: There is no abdominal tenderness. There is left CVA tenderness. There is no right CVA tenderness.  Musculoskeletal:        General: Normal range of motion.     Cervical back: Normal range of motion.     Comments: Left CVA has mild tenderness.  There is tenderness to  palpation in the lumbar muscles.  No abdominal tenderness.  Urinalysis negative  Lymphadenopathy:     Cervical: No cervical adenopathy.  Skin:    General: Skin is warm and dry.     Comments: Cystic acne and picked skin noted on back  Neurological:     General: No focal  deficit present.     Mental Status: He is alert.      UC Treatments / Results  Labs (all labs ordered are listed, but only abnormal results are displayed) Labs Reviewed  POCT URINALYSIS DIP (MANUAL ENTRY) - Abnormal; Notable for the following components:      Result Value   Spec Grav, UA >=1.030 (*)    All other components within normal limits    EKG   Radiology No results found.  Procedures Procedures (including critical care time)  Medications Ordered in UC Medications - No data to display  Initial Impression / Assessment and Plan / UC Course  I have reviewed the triage vital signs and the nursing notes.  Pertinent labs & imaging results that were available during my care of the patient were reviewed by me and considered in my medical decision making (see chart for details).     Final Clinical Impressions(s) / UC Diagnoses   Final diagnoses:  Upper respiratory tract infection, unspecified type  Lumbar muscle pain     Discharge Instructions      Make sure that  you are drinking lots of water Take the antibiotic 2 times a day May take ibuprofen 3 times a day with food.  This will help with your back pain Return if needed     ED Prescriptions     Medication Sig Dispense Auth. Provider   ibuprofen (ADVIL) 600 MG tablet Take 1 tablet (600 mg total) by mouth every 6 (six) hours as needed. 30 tablet Eustace Moore, MD   cephALEXin (KEFLEX) 500 MG capsule Take 1 capsule (500 mg total) by mouth 2 (two) times daily. 10 capsule Eustace Moore, MD      PDMP not reviewed this encounter.   Eustace Moore, MD 12/01/22 1322    Eustace Moore, MD 12/01/22 [phone]

## 2022-12-06 ENCOUNTER — Encounter: Payer: Self-pay | Admitting: Family Medicine

## 2022-12-06 MED ORDER — OMEPRAZOLE 40 MG PO CPDR: 40.0000 mg | DELAYED_RELEASE_CAPSULE | Freq: Two times a day (BID) | ORAL | 0 refills | Status: DC

## 2022-12-17 ENCOUNTER — Ambulatory Visit (INDEPENDENT_AMBULATORY_CARE_PROVIDER_SITE_OTHER): Payer: Medicaid Other | Admitting: Family Medicine

## 2022-12-17 ENCOUNTER — Encounter: Payer: Self-pay | Admitting: Family Medicine

## 2022-12-17 VITALS — BP 100/65 | HR 93 | Ht 70.0 in | Wt 159.0 lb

## 2022-12-17 DIAGNOSIS — H66002 Acute suppurative otitis media without spontaneous rupture of ear drum, left ear: Secondary | ICD-10-CM | POA: Diagnosis not present

## 2022-12-17 DIAGNOSIS — H6692 Otitis media, unspecified, left ear: Secondary | ICD-10-CM | POA: Insufficient documentation

## 2022-12-17 MED ORDER — CEFDINIR 300 MG PO CAPS: 300.0000 mg | ORAL_CAPSULE | Freq: Two times a day (BID) | ORAL | 0 refills | Status: DC

## 2022-12-17 NOTE — Progress Notes (Signed)
Christopher Forbes - 22 y.o. male MRN [id number]  Date of birth: [birth year]  Subjective Chief Complaint  Patient presents with   Toe Injury   Ear Pain    HPI Christopher Forbes is a 22 year old male here today with complaint of left ear pain.  Recently seen in urgent care and noted to have injected TM.  Treated for otitis media with Keflex.  He reports that symptoms did not really improve and actually seem a little worse.  No drainage from the ear.  Denies fever or chills.  He has a sore spot on one of his toes as well.  No drainage around this area.  ROS:  A comprehensive ROS was completed and negative except as noted per HPI  Allergies  Allergen Reactions   Egg-Derived Products Nausea And Vomiting    Past Medical History:  Diagnosis Date   Acne    Allergy    Anxiety    Asthma    Chronic headaches    Depression    GERD (gastroesophageal reflux disease)     Past Surgical History:  Procedure Laterality Date   OPEN REDUCTION INTERNAL FIXATION (ORIF) METACARPAL Right 11/19/2018   Procedure: Right small finger metacarpal open malunion repair;  Surgeon: Bradly Bienenstock, MD;  Location: [facility];  Service: Orthopedics;  Laterality: Right;  90 minutes   TYMPANOSTOMY TUBE PLACEMENT Bilateral    placed age 3 have fallen out   UPPER GASTROINTESTINAL ENDOSCOPY     WISDOM TOOTH EXTRACTION  04/04/2021    Social History   Socioeconomic History   Marital status: Single    Spouse name: Not on file   Number of children: Not on file   Years of education: Not on file   Highest education level: Not on file  Occupational History   Occupation: student  Tobacco Use   Smoking status: Never   Smokeless tobacco: Never  Vaping Use   Vaping status: Former  Substance and Sexual Activity   Alcohol use: No    Alcohol/week: 0.0 standard drinks of alcohol   Drug use: No   Sexual activity: Yes    Partners: Male  Other Topics Concern   Not on file  Social History Narrative    Patient lives with brother step sister and step dad Transport planner. Mom is involved. Right handed   Social Determinants of Health   Financial Resource Strain: High Risk (08/13/2022)   Overall Financial Resource Strain (CARDIA)    Difficulty of Paying Living Expenses: Very hard  Food Insecurity: No Food Insecurity (09/02/2022)   Hunger Vital Sign    Worried About Running Out of Food in the Last Year: Never true    Ran Out of Food in the Last Year: Never true  Recent Concern: Food Insecurity - Food Insecurity Present (08/13/2022)   Hunger Vital Sign    Worried About Running Out of Food in the Last Year: Sometimes true    Ran Out of Food in the Last Year: Sometimes true  Transportation Needs: No Transportation Needs (09/02/2022)   PRAPARE - Administrator, Civil Service (Medical): No    Lack of Transportation (Non-Medical): No  Physical Activity: Insufficiently Active (08/13/2022)   Exercise Vital Sign    Days of Exercise per Week: 2 days    Minutes of Exercise per Session: 20 min  Stress: Stress Concern Present (08/13/2022)   [employer] of Occupational Health - Occupational Stress Questionnaire    Feeling of Stress : To some extent  Social Connections: Unknown (08/13/2022)   Social Connection and Isolation Panel [NHANES]    Frequency of Communication with Friends and Family: Never    Frequency of Social Gatherings with Friends and Family: Patient declined    Attends Religious Services: Patient declined    Database administrator or Organizations: No    Attends Engineer, structural: Not on file    Marital Status: Never married    Family History  Problem Relation Age of Onset   Migraines Mother    Diabetes Father    Hyperlipidemia Father    Hypertension Father    COPD Father    Asthma Father    Migraines Brother    Stroke Maternal Grandmother    Colon cancer Neg Hx    Esophageal cancer Neg Hx    Rectal cancer Neg Hx    Stomach cancer Neg Hx     Health  Maintenance  Topic Date Due   COVID-19 Vaccine (3 - 2023-24 season) 03/07/2023 (Originally 11/18/2022)   Hepatitis C Screening  10/09/2023 (Originally 12/28/2019)   DTaP/Tdap/Td (8 - Td or Tdap) 10/08/2032   HPV VACCINES  Completed   HIV Screening  Completed     ----------------------------------------------------------------------------------------------------------------------------------------------------------------------------------------------------------------- Physical Exam BP 100/65 (BP Location: Right Arm, Patient Position: Sitting, Cuff Size: Normal)   Pulse 93   Ht 5\' 10"  (1.778 m)   Wt 159 lb (72.1 kg)   SpO2 100%   BMI 22.81 kg/m   Physical Exam Constitutional:      Appearance: Normal appearance.  HENT:     Right Ear: Tympanic membrane normal.     Ears:     Comments: Left TM injected with purulent appearing fluid. Eyes:     General: No scleral icterus. Cardiovascular:     Rate and Rhythm: Normal rate and regular rhythm.  Pulmonary:     Effort: Pulmonary effort is normal.     Breath sounds: Normal breath sounds.  Musculoskeletal:     Cervical back: Neck supple.  Neurological:     Mental Status: He is alert.     ------------------------------------------------------------------------------------------------------------------------------------------------------------------------------------------------------------------- Assessment and Plan  Left otitis media Treated with Omnicef.  Recommend that he stay well-hydrated.  May use Tylenol and/or ibuprofen for pain control.   Meds ordered this encounter  Medications   cefdinir (OMNICEF) 300 MG capsule    Sig: Take 1 capsule (300 mg total) by mouth 2 (two) times daily.    Dispense:  20 capsule    Refill:  0    No follow-ups on file.    This visit occurred during the SARS-CoV-2 public health emergency.  Safety protocols were in place, including screening questions prior to the visit, additional usage of  staff PPE, and extensive cleaning of exam room while observing appropriate contact time as indicated for disinfecting solutions.

## 2022-12-17 NOTE — Assessment & Plan Note (Signed)
Treated with Omnicef.  Recommend that he stay well-hydrated.  May use Tylenol and/or ibuprofen for pain control.

## 2022-12-30 ENCOUNTER — Ambulatory Visit (HOSPITAL_COMMUNITY)
Admission: EM | Admit: 2022-12-30 | Discharge: 2022-12-30 | Disposition: A | Discharge status: Home / Self Care | Payer: MEDICAID | Attending: Addiction Medicine | Admitting: Addiction Medicine

## 2022-12-30 ENCOUNTER — Encounter (HOSPITAL_COMMUNITY): Payer: Self-pay

## 2022-12-30 ENCOUNTER — Emergency Department (HOSPITAL_COMMUNITY)
Admission: EM | Admit: 2022-12-30 | Discharge: 2022-12-31 | Disposition: A | Discharge status: Home / Self Care | Payer: MEDICAID | Attending: Emergency Medicine | Admitting: Emergency Medicine

## 2022-12-30 ENCOUNTER — Other Ambulatory Visit: Payer: Self-pay

## 2022-12-30 DIAGNOSIS — R509 Fever, unspecified: Secondary | ICD-10-CM | POA: Diagnosis present

## 2022-12-30 DIAGNOSIS — Z91148 Patient's other noncompliance with medication regimen for other reason: Secondary | ICD-10-CM | POA: Insufficient documentation

## 2022-12-30 DIAGNOSIS — Z9151 Personal history of suicidal behavior: Secondary | ICD-10-CM | POA: Insufficient documentation

## 2022-12-30 DIAGNOSIS — F329 Major depressive disorder, single episode, unspecified: Secondary | ICD-10-CM | POA: Insufficient documentation

## 2022-12-30 DIAGNOSIS — E876 Hypokalemia: Secondary | ICD-10-CM | POA: Insufficient documentation

## 2022-12-30 DIAGNOSIS — R109 Unspecified abdominal pain: Secondary | ICD-10-CM | POA: Diagnosis not present

## 2022-12-30 DIAGNOSIS — H6501 Acute serous otitis media, right ear: Secondary | ICD-10-CM | POA: Insufficient documentation

## 2022-12-30 DIAGNOSIS — R112 Nausea with vomiting, unspecified: Secondary | ICD-10-CM | POA: Insufficient documentation

## 2022-12-30 DIAGNOSIS — F411 Generalized anxiety disorder: Secondary | ICD-10-CM | POA: Insufficient documentation

## 2022-12-30 LAB — CBC
HCT: 47.9 % (ref 39.0–52.0)
Hemoglobin: 16.2 g/dL (ref 13.0–17.0)
MCH: 30.4 pg (ref 26.0–34.0)
MCHC: 33.8 g/dL (ref 30.0–36.0)
MCV: 89.9 fL (ref 80.0–100.0)
Platelets: 236 10*3/uL (ref 150–400)
RBC: 5.33 MIL/uL (ref 4.22–5.81)
RDW: 11.9 % (ref 11.5–15.5)
WBC: 5.9 10*3/uL (ref 4.0–10.5)
nRBC: 0 % (ref 0.0–0.2)

## 2022-12-30 LAB — URINALYSIS, ROUTINE W REFLEX MICROSCOPIC
Bilirubin Urine: NEGATIVE
Glucose, UA: NEGATIVE mg/dL
Hgb urine dipstick: NEGATIVE
Ketones, ur: NEGATIVE mg/dL
Leukocytes,Ua: NEGATIVE
Nitrite: NEGATIVE
Protein, ur: 30 mg/dL — AB
Specific Gravity, Urine: 1.018 (ref 1.005–1.030)
pH: 8 (ref 5.0–8.0)

## 2022-12-30 LAB — RAPID URINE DRUG SCREEN, HOSP PERFORMED
Amphetamines: NOT DETECTED
Barbiturates: NOT DETECTED
Benzodiazepines: NOT DETECTED
Cocaine: NOT DETECTED
Opiates: NOT DETECTED
Tetrahydrocannabinol: NOT DETECTED

## 2022-12-30 LAB — COMPREHENSIVE METABOLIC PANEL
ALT: 26 U/L (ref 0–44)
AST: 27 U/L (ref 15–41)
Albumin: 4.9 g/dL (ref 3.5–5.0)
Alkaline Phosphatase: 64 U/L (ref 38–126)
Anion gap: 13 (ref 5–15)
BUN: 7 mg/dL (ref 6–20)
CO2: 21 mmol/L — ABNORMAL LOW (ref 22–32)
Calcium: 9.8 mg/dL (ref 8.9–10.3)
Chloride: 106 mmol/L (ref 98–111)
Creatinine, Ser: 1.23 mg/dL (ref 0.61–1.24)
GFR, Estimated: >60 mL/min (ref >60)
Glucose, Bld: 111 mg/dL — ABNORMAL HIGH (ref 70–99)
Potassium: 3 mmol/L — ABNORMAL LOW (ref 3.5–5.1)
Sodium: 140 mmol/L (ref 135–145)
Total Bilirubin: 1 mg/dL (ref 0.3–1.2)
Total Protein: 7.8 g/dL (ref 6.5–8.1)

## 2022-12-30 LAB — LIPASE, BLOOD: Lipase: 29 U/L (ref 11–51)

## 2022-12-30 MED ORDER — ONDANSETRON 4 MG PO TBDP
4.0000 mg | ORAL_TABLET | Freq: Once | ORAL | Status: AC
  Administered 2022-12-30: 4 mg via ORAL
  Filled 2022-12-30: qty 1

## 2022-12-30 MED ORDER — AMOXICILLIN-POT CLAVULANATE 875-125 MG PO TABS
1.0000 | ORAL_TABLET | Freq: Two times a day (BID) | ORAL | 0 refills | Status: AC
Start: 2022-12-30 — End: 2023-01-06

## 2022-12-30 MED ORDER — ONDANSETRON 4 MG PO TBDP: 4.0000 mg | ORAL_TABLET | Freq: Three times a day (TID) | ORAL | 0 refills | Status: DC | PRN

## 2022-12-30 MED ORDER — LOPERAMIDE HCL 2 MG PO CAPS
4.0000 mg | ORAL_CAPSULE | Freq: Once | ORAL | Status: AC
  Administered 2022-12-30: 4 mg via ORAL
  Filled 2022-12-30: qty 2

## 2022-12-30 MED ORDER — KETOROLAC TROMETHAMINE 10 MG PO TABS
10.0000 mg | ORAL_TABLET | Freq: Once | ORAL | Status: AC
  Administered 2022-12-30: 10 mg via ORAL
  Filled 2022-12-30: qty 1

## 2022-12-30 MED ORDER — AMOXICILLIN-POT CLAVULANATE 875-125 MG PO TABS
1.0000 | ORAL_TABLET | Freq: Once | ORAL | Status: AC
  Administered 2022-12-30: 1 via ORAL
  Filled 2022-12-30: qty 1

## 2022-12-30 NOTE — ED Notes (Signed)
Pt discharged with  AVS.  AVS reviewed prior to discharge.  Pt alert, oriented, and ambulatory.  Safety maintained.  °

## 2022-12-30 NOTE — ED Notes (Signed)
Pt provided with sandwich bag for PO trial.

## 2022-12-30 NOTE — Discharge Instructions (Addendum)
You are seen in the emergency department today for abdominal pain, nausea, vomiting as well as your pain.  Your labs are thankfully reassuring can I believe that your pain is still lingering as the antibiotics are not working well to treat this infection.  I sent a prescription for Augmentin to your pharmacy and a dose was given to you here in the emergency department for discharge. I also sent a prescription for Zofran to your pharmacy.  Please plan to following up with your primary care provider for repeat evaluation to ensure symptoms are improving.  If symptoms or not improving or worsening, return to the emergency department or follow-up with your primary care provider sooner.

## 2022-12-30 NOTE — ED Triage Notes (Signed)
Pt arrived POV from home c/o abdominal pain and N/V/D x1 week. Pt also states he has dizziness and a headache. Pt also states when he turns his head side to side it sends a shock down his body.

## 2022-12-30 NOTE — Progress Notes (Signed)
   12/30/22 1407  [facility] Triage Screening (Walk-ins at [facility] only)  How Did You Hear About [place]? Self  What Is the Reason for Your Visit/Call Today? Christopher Forbes is a 22 year old male presenting to [facility] unaccompanied. Pt reports he got kicked out of his step fathers home. Pt also reports he has been taking his depression medication (lexapro and welbutrin) as prescribed. Pt does report he has been out of his wellbutrin for about a week, and is only taking lexapro currently. Pt mentions that he was seeing a therapist recently, but not longer sees this therapist because they do not take his insurance any longer. However, pt reports he has passive thoughts of wanting to end his life. Currently, pt denies having these thoughts. Pt reports he had a past suicide attempt by trying to overdose (100 pills of lexapro) in May. Around the month of May, pt was hospitalized at [facility] for a week and a few days for his previous "suicide attempt". Pt reports he feels "stress" from being at his step fathers home, which is causing the pts suicidal thoughts. Pt reports he has trouble sleeping and is only getting 5 hours of sleep since Friday. Pt reports that he drank "12% alcohol" yesterday, but is unable to recall what he was drinking exactly. Pt mentions he is here for any type of "mental health help" he can get. Pt is coherent and calm during triage. Pt has no known diagnoses at this time. Pt denies drug use, SI, HI and AVH.  How Long Has This Been Causing You Problems? <Week  Have You Recently Had Any Thoughts About Hurting Yourself? No  Are You Planning to Commit Suicide/Harm Yourself At This time? No  Have you Recently Had Thoughts About Hurting Someone [name]? No  Are You Planning To Harm Someone At This Time? No  Are you currently experiencing any auditory, visual or other hallucinations? No  Have You Used Any Alcohol or Drugs in the Past 24 Hours? Yes  How long ago did you use Drugs or Alcohol? Yesterday  What Did You  Use and How Much? "12% alcohol"  Do you have any current medical co-morbidities that require immediate attention? No  Clinician description of patient physical appearance/behavior: cooperaitve, calm  What Do You Feel Would Help You the Most Today? Treatment for Depression or other mood problem;Stress Management  If access to [facility] was not available, would you have sought care in the Emergency Department? No  Determination of Need Routine (7 days)  Options For Referral Intensive Outpatient Therapy

## 2022-12-30 NOTE — ED Provider Notes (Signed)
[place] EMERGENCY DEPARTMENT AT [facility] Provider Note   CSN: [id number] Arrival date & time: 12/30/22  1638     History Chief Complaint  Patient presents with   Abdominal Pain    Christopher Forbes is a 22 y.o. male.  Patient with past history significant for migraine headaches presents to the emergency department at this time with concerns of abdominal pain, headache and ear pain.  Reports that she has been experiencing nausea, vomiting diarrhea for the last week or so without significant improvement.  Does report that he was recently diagnosed with an ear infection and has been on antibiotics.  Likely the reason that patient is having the nausea vomiting and diarrhea.  Patient is also reporting some poor oral intake due to the nausea.  While patient has been here has not had any episodes of emesis.  Denies any fevers, weakness, or fatigue.   Abdominal Pain      Home Medications Prior to Admission medications   Medication Sig Start Date End Date Taking? Authorizing Provider  albuterol (VENTOLIN HFA) 108 (90 Base) MCG/ACT inhaler Inhale 2 puffs into the lungs every 4 (four) hours as needed for wheezing or shortness of breath. 09/03/22  Yes Glade Lloyd, MD  amoxicillin-clavulanate (AUGMENTIN) 875-125 MG tablet Take 1 tablet by mouth every 12 (twelve) hours for 7 days. 12/30/22 01/06/23 Yes Smitty Knudsen, PA-C  buPROPion (WELLBUTRIN XL) 300 MG 24 hr tablet Take 300 mg by mouth daily. 09/11/22  Yes [provider]  cetirizine (ZYRTEC) 10 MG tablet Take 10 mg by mouth daily. 12/06/22  Yes [provider]  doxepin (SINEQUAN) 10 MG capsule TAKE 1-2 CAPSULES (10-20 MG TOTAL) BY MOUTH AT BEDTIME AS NEEDED. Patient taking differently: Take 10 mg by mouth at bedtime as needed (For anxiety). 11/20/22  Yes Everrett Coombe, DO  Erenumab-aooe (AIMOVIG) 140 MG/ML SOAJ Inject 140 mg into the skin every 28 (twenty-eight) days. 10/29/22  Yes Jaffe, Adam R, DO   escitalopram (LEXAPRO) 20 MG tablet Take 20 mg by mouth at bedtime. 05/18/22  Yes [provider]  fluticasone (FLOVENT HFA) 44 MCG/ACT inhaler Inhale 2 puffs into the lungs 2 (two) times daily. 10/09/22  Yes [provider]  omeprazole (PRILOSEC) 40 MG capsule Take 1 capsule (40 mg total) by mouth in the morning and at bedtime. 12/06/22  Yes Everrett Coombe, DO  ondansetron (ZOFRAN-ODT) 4 MG disintegrating tablet Take 1 tablet (4 mg total) by mouth every 8 (eight) hours as needed. 12/30/22  Yes Maryanna Shape A, PA-C  rizatriptan (MAXALT-MLT) 10 MG disintegrating tablet TAKE 1 TABLET BY MOUTH AS NEEDED FOR MIGRAINE. MAY REPEAT IN 2 HOURS IF NEEDED 09/18/22  Yes Everrett Coombe, DO  topiramate (TOPAMAX) 50 MG tablet TAKE 1 TABLET BY MOUTH TWICE A DAY 11/26/22  Yes Everrett Coombe, DO  cefdinir (OMNICEF) 300 MG capsule Take 1 capsule (300 mg total) by mouth 2 (two) times daily. Patient not taking: Reported on 12/30/2022 12/17/22   Everrett Coombe, DO  ibuprofen (ADVIL) 600 MG tablet Take 1 tablet (600 mg total) by mouth every 6 (six) hours as needed. Patient not taking: Reported on 12/30/2022 12/01/22   Eustace Moore, MD  Spacer/Aero-Holding Deretha [facility] DIAMOND) MISC  04/04/20   [provider]      Allergies    Egg-derived products    Review of Systems   Review of Systems  Gastrointestinal:  Positive for abdominal pain.  All other systems reviewed and are negative.  Physical Exam Updated Vital Signs BP 109/74   Pulse 73   Temp 98 F (36.7 C)   Resp 16   Ht 5\' 10"  (1.778 m)   Wt 70.8 kg   SpO2 98%   BMI 22.38 kg/m  Physical Exam Vitals and nursing note reviewed.  Constitutional:      General: He is not in acute distress.    Appearance: He is well-developed.  HENT:     Head: Normocephalic and atraumatic.     Right Ear: Hearing normal. A middle ear effusion is present. Tympanic membrane is injected.     Left Ear: Hearing normal.     Ears:      Comments: Right-sided TM injected with middle ear effusion present.  Left ear unremarkable. Eyes:     Conjunctiva/sclera: Conjunctivae normal.  Cardiovascular:     Rate and Rhythm: Normal rate and regular rhythm.     Heart sounds: No murmur heard. Pulmonary:     Effort: Pulmonary effort is normal. No respiratory distress.     Breath sounds: Normal breath sounds.  Abdominal:     Palpations: Abdomen is soft.     Tenderness: There is no abdominal tenderness.  Musculoskeletal:        General: No swelling.     Cervical back: Neck supple.  Skin:    General: Skin is warm and dry.     Capillary Refill: Capillary refill takes less than 2 seconds.  Neurological:     Mental Status: He is alert.  Psychiatric:        Mood and Affect: Mood normal.     ED Results / Procedures / Treatments   Labs (all labs ordered are listed, but only abnormal results are displayed) Labs Reviewed  COMPREHENSIVE METABOLIC PANEL - Abnormal; Notable for the following components:      Result Value   Potassium 3.0 (*)    CO2 21 (*)    Glucose, Bld 111 (*)    All other components within normal limits  URINALYSIS, ROUTINE W REFLEX MICROSCOPIC - Abnormal; Notable for the following components:   Color, Urine AMBER (*)    APPearance CLOUDY (*)    Protein, ur 30 (*)    Bacteria, UA RARE (*)    All other components within normal limits  LIPASE, BLOOD  CBC  RAPID URINE DRUG SCREEN, HOSP PERFORMED    EKG None  Radiology No results found.  Procedures Procedures   Medications Ordered in ED Medications  ondansetron (ZOFRAN-ODT) disintegrating tablet 4 mg (4 mg Oral Given 12/30/22 1841)  loperamide (IMODIUM) capsule 4 mg (4 mg Oral Given 12/30/22 1841)  ketorolac (TORADOL) tablet 10 mg (10 mg Oral Given 12/30/22 2253)  amoxicillin-clavulanate (AUGMENTIN) 875-125 MG per tablet 1 tablet (1 tablet Oral Given 12/30/22 2324)    ED Course/ Medical Decision Making/ A&P                               Medical  Decision Making Amount and/or Complexity of Data Reviewed Labs: ordered.  Risk Prescription drug management.   This patient presents to the ED for concern of abdominal pain, ear pain.  Differential diagnosis includes recent antibiotic use, migraine headache, otitis media, otitis externa   Lab Tests:  I Ordered, and personally interpreted labs.  The pertinent results include: CBC is unremarkable, CMP with mild hypokalemia at 3.0 likely due to GI loss, lipase is negative, UA without obvious signs of infection but  some bacteria noted in urine as well as squamous epithelial cells so contamination is possible   Medicines ordered and prescription drug management:  I ordered medication including Zofran, loperamide, Toradol, Augmentin for nausea, diarrhea, pain, otitis media Reevaluation of the patient after these medicines showed that the patient improved I have reviewed the patients home medicines and have made adjustments as needed   Problem List / ED Course:  Patient with past medical history significant for for migraine headaches presents the emergency department today with concerns of abdominal pain, headache and ear pain.  Was recently diagnosed with otitis media and started on Keflex and switched to cefdinir.  Reports he has not had significant improvement in symptoms since the transition to the stiff antibiotic.  I suspect the patient likely is having the abdominal discomfort due to the recent antibiotics.  However appears that antibiotic coverage is likely not ideal given patient's findings of acute otitis media still present.  Will plan to switch patient to Augmentin.  Abdomen is otherwise unremarkable and nontender on examination.  Will trial course of Zofran, Imodium, and Toradol and further assess. After ministration of Zofran and Imodium, patient tolerating oral intake without any difficulty.  No recurrence of vomiting.  Suspect patient's head pain/headache is also secondary to the  recent otitis media that has not resolved.  Will give a dose of Augmentin here in the emergency department and advised patient to follow-up with primary care provider symptoms not improving.  Prescription for Augmentin also sent to patient's pharmacy for further management and treatment at home.  No evidence of tympanic membrane perforation or mastoiditis as there is no mastoid tenderness or erythema. Patient is in agreement with treatment plan verbalized understanding strict return precautions.  Patient discharged home in stable condition.  Final Clinical Impression(s) / ED Diagnoses Final diagnoses:  Non-recurrent acute serous otitis media of right ear  Nausea and vomiting, unspecified vomiting type    Rx / DC Orders ED Discharge Orders          Ordered    amoxicillin-clavulanate (AUGMENTIN) 875-125 MG tablet  Every 12 hours        12/30/22 2313    ondansetron (ZOFRAN-ODT) 4 MG disintegrating tablet  Every 8 hours PRN        12/30/22 2313              Smitty Knudsen, PA-C 12/31/22 0003    Glyn Ade, MD 12/31/22 1504
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
## 2022-12-30 NOTE — ED Notes (Signed)
Pt tolerating PO trial at this time.

## 2022-12-30 NOTE — Discharge Instructions (Addendum)
Discharge recommendations:   Medications: Patient is to take medications as prescribed. The patient or patient's guardian is to contact a medical professional and/or outpatient provider to address any new side effects that develop. The patient or the patient's guardian should update outpatient providers of any new medications and/or medication changes.    Outpatient Follow up: Please review list of outpatient resources for psychiatry and counseling. Please follow up with your primary care provider for all medical related needs.    Therapy: We recommend that patient participate in individual therapy to address mental health concerns.   Atypical antipsychotics: If you are prescribed an atypical antipsychotic, it is recommended that your height, weight, BMI, blood pressure, fasting lipid panel, and fasting blood sugar be monitored by your outpatient providers.  Safety:   The following safety precautions should be taken:   No sharp objects. This includes scissors, razors, scrapers, and putty knives.   Chemicals should be removed and locked up.   Medications should be removed and locked up.   Weapons should be removed and locked up. This includes firearms, knives and instruments that can be used to cause injury.   The patient should abstain from use of illicit substances/drugs and abuse of any medications.  If symptoms worsen or do not continue to improve or if the patient becomes actively suicidal or homicidal then it is recommended that the patient return to the closest hospital emergency department, the [facility], or call 911 for further evaluation and treatment. National Suicide Prevention Lifeline 1-800-SUICIDE or 502-299-3889.  About 988 988 offers 24/7 access to trained crisis counselors who can help people experiencing mental health-related distress. People can call or text 988 or chat 988lifeline.org for themselves or if they are worried about a  loved one who may need crisis support.       Based on what you have shared, a list of resources for outpatient therapy and psychiatry is provided below to get you started back on treatment.  It is imperative that you follow through with treatment within 5-7 days from the day of discharge to prevent any further risk to your safety or mental well-being.  You are not limited to the list provided.  In case of an urgent crisis, you may contact the Mobile Crisis Unit with [facility] at [phone].        Outpatient Services for Therapy and Medication Management for [facility] [address], [place], [address] [phone] phone  New Patient Assessment/Therapy Walk-ins Monday and Wednesday: 8am until slots are full. Every 1st and 2nd Friday: 1pm - 5pm  NO ASSESSMENT/THERAPY WALK-INS ON TUESDAYS OR THURSDAYS  New Patient Psychiatry/Medication Management Walk-ins Monday-Friday: 8am-11am arrive by 7 am 2cd floor   For all walk-ins, we ask that you arrive by 7:30am because patient will be seen in the order of arrival.  Availability is limited; therefore, you may not be seen on the same day that you walk-in.  Our goal is to serve and meet the needs of our community to the best of our ability.   [facility] [address] [place], [place], [address] [phone] phone  [facility] [address] [place], [place], [address] [phone] phone [phone] phone ([address], Anthem/Elevance, [address], [address], [address], [address], [address], [place], [place], [address], [employer], [place], UHC, [employer], [place], Out of Network)  [employer], [place] [address]  [place], [place], [address] [phone] phone ([address] Anthem/Elevance, [address] Options/Carelon, BCBS, [address], [place], [place],  [place], [place], [employer], [place], [place], [place], [facility])  [employer] [phone] [place], [place], [address] [phone] phone (Medicaid, ask about other insurance)  [facility] [address], [address] [place], [place], [address] [phone] phone [phone] fax ([address] [place] , [place], [place], [place] Health Choice, UHC, General Electric, Self-Pay)  [facility] [address] [place], [place], [address] [phone] phone ([address] Anthem/Elevance, [address], [address], [place], [employer], [place], [place], [place], [employer], [place], [place], [place], [facility])  [facility] - 6-8 MONTH WAIT FOR THERAPY; [facility] FOR MEDICATION MANAGEMENT [address] [place], [place], [address] [phone] phone ([address], AmeriHealth [address] - [place], [address], [place], [place], [employer] Health Plans, [address], BCBS Healthy [place], [place], [address], [place], [place], [place], [place], Tricare, UHC, [employer], [place])  [facility] [address] [place], [place], [address] [phone] phone  [facility] [address], [address] [place], [place], [address] [phone] phone  [facility] [address], [address] [place], [place], [address] [phone] phone  [facility] [place] - THERAPY ONLY [address] [place], [address] [phone] phone  [facility], [place] [address] [place], [place], [address] [phone] phone  [facility] [address] [place], [place], [address] [phone] phone [phone] fax  [facility] [address] [place], [place], [address] [phone] phone [phone] fax  [facility] [phone] [address] [place], [place], [address] [phone] phone  Jovita Kussmaul [address] [place], [place], [address]  [phone]  phone  [facility]  (Adults Only) [address] [place], [place], [address]  [phone] phone [phone] fax

## 2022-12-30 NOTE — ED Notes (Signed)
Per lab, they did not receive the urine specimens collected at 1730. Patient able to provide additional urine sample which was sent to lab.

## 2022-12-31 NOTE — ED Notes (Signed)
Pt provided with AVS.  Education complete; all questions answered.  Pt leaving ED in stable condition at this time, ambulatory with all belongings.

## 2023-01-04 ENCOUNTER — Encounter: Payer: Self-pay | Admitting: Family Medicine

## 2023-01-04 MED ORDER — BUPROPION HCL ER (XL) 300 MG PO TB24
300.0000 mg | ORAL_TABLET | Freq: Every day | ORAL | 1 refills | Status: DC
  Filled 2023-04-26 – 2023-05-15 (×3): qty 90, 90d supply, fill #0

## 2023-01-10 ENCOUNTER — Encounter: Payer: Self-pay | Admitting: Family Medicine

## 2023-01-10 ENCOUNTER — Ambulatory Visit (INDEPENDENT_AMBULATORY_CARE_PROVIDER_SITE_OTHER): Payer: MEDICAID | Admitting: Family Medicine

## 2023-01-10 VITALS — BP 117/16 | HR 88 | Ht 70.0 in | Wt 160.8 lb

## 2023-01-10 DIAGNOSIS — H65195 Other acute nonsuppurative otitis media, recurrent, left ear: Secondary | ICD-10-CM

## 2023-01-10 MED ORDER — LEVOFLOXACIN 500 MG PO TABS
500.0000 mg | ORAL_TABLET | Freq: Every day | ORAL | 0 refills | Status: AC
Start: 2023-01-10 — End: 2023-01-17

## 2023-01-10 NOTE — Progress Notes (Signed)
Acute Office Visit  Subjective:     Patient ID: Christopher Forbes, male    DOB: May 16, 2000, 22 y.o.   MRN: [id number]  Chief Complaint  Patient presents with   Ear Pain    X1 month    HPI Patient is in today for ear pain with L ear being worse. He has been seen for recurrent otitis media and given keflex, omnicef, and augmentin. Here today because he is not better.    Review of Systems  Constitutional:  Negative for chills and fever.  HENT:  Positive for ear pain.   Respiratory:  Negative for cough and shortness of breath.   Cardiovascular:  Negative for chest pain.  Neurological:  Negative for headaches.        Objective:    BP (!) 117/16 (BP Location: Left Arm, Patient Position: Sitting, Cuff Size: Normal)   Pulse 88   Ht 5\' 10"  (1.778 m)   Wt 160 lb 12 oz (72.9 kg)   SpO2 100%   BMI 23.07 kg/m    Physical Exam Vitals and nursing note reviewed.  Constitutional:      General: He is not in acute distress.    Appearance: Normal appearance.  HENT:     Head: Normocephalic and atraumatic.     Comments: Tenderness along left ear to jaw line on palpaition    Right Ear: Tympanic membrane, ear canal and external ear normal.     Left Ear: External ear normal.     Ears:     Comments: Left ear erythema    Nose: Nose normal.  Eyes:     Conjunctiva/sclera: Conjunctivae normal.  Cardiovascular:     Rate and Rhythm: Normal rate and regular rhythm.  Pulmonary:     Effort: Pulmonary effort is normal.     Breath sounds: Normal breath sounds.  Neurological:     General: No focal deficit present.     Mental Status: He is alert and oriented to person, place, and time.  Psychiatric:        Mood and Affect: Mood normal.        Behavior: Behavior normal.        Thought Content: Thought content normal.        Judgment: Judgment normal.     No results found for any visits on 01/10/23.      Assessment & Plan:   Problem List Items Addressed This Visit       Nervous  and Auditory   Left otitis media - Primary    - have gone ahead and treated with levaquin (has tried and failed keflex, omnicef, and augmentin) - referral placed to ent if no better      Relevant Medications   levofloxacin (LEVAQUIN) 500 MG tablet   Other Relevant Orders   Ambulatory referral to ENT    Meds ordered this encounter  Medications   levofloxacin (LEVAQUIN) 500 MG tablet    Sig: Take 1 tablet (500 mg total) by mouth daily for 7 days.    Dispense:  7 tablet    Refill:  0    No follow-ups on file.  Charlton Amor, DO

## 2023-01-10 NOTE — Assessment & Plan Note (Signed)
-   have gone ahead and treated with levaquin (has tried and failed keflex, omnicef, and augmentin) - referral placed to ent if no better

## 2023-01-15 ENCOUNTER — Ambulatory Visit (INDEPENDENT_AMBULATORY_CARE_PROVIDER_SITE_OTHER): Payer: MEDICAID | Admitting: Sports Medicine

## 2023-01-15 DIAGNOSIS — M25562 Pain in left knee: Secondary | ICD-10-CM

## 2023-01-15 MED ORDER — NAPROXEN 500 MG PO TABS
500.0000 mg | ORAL_TABLET | Freq: Two times a day (BID) | ORAL | 3 refills | Status: DC
Start: 2023-01-15 — End: 2023-06-20

## 2023-01-15 NOTE — Assessment & Plan Note (Signed)
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 We injected his knee back in the summertime after failure of conservative treatment. He did well, now having recurrence of discomfort, patellofemoral from a clinical standpoint, exam is completely benign. Proceeding with aggressive physical therapy at [facility] PT, switching to naproxen, he will get a reaction knee brace and return to see me in 6 to 8 weeks.

## 2023-01-15 NOTE — Progress Notes (Signed)
    Procedures performed today:    None.  Independent interpretation of notes and tests performed by another provider:   None.  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 We injected his knee back in the summertime after failure of conservative treatment. He did well, now having recurrence of discomfort, patellofemoral from a clinical standpoint, exam is completely benign. Proceeding with aggressive physical therapy at [facility], switching to naproxen, he will get a reaction knee brace and return to see me in 6 to 8 weeks.    ____________________________________________ Ihor Austin. Benjamin Stain, M.D., ABFM., CAQSM., AME. Primary Care and Sports Medicine  [facility]  Adjunct Professor of Family Medicine  [employer] [facility] [employer]  Restaurant manager, fast food

## 2023-01-20 ENCOUNTER — Encounter: Payer: Self-pay | Admitting: Emergency Medicine

## 2023-01-20 ENCOUNTER — Ambulatory Visit
Admission: EM | Admit: 2023-01-20 | Discharge: 2023-01-20 | Disposition: A | Discharge status: Home / Self Care | Payer: MEDICAID | Attending: Family Medicine | Admitting: Family Medicine

## 2023-01-20 ENCOUNTER — Other Ambulatory Visit: Payer: Self-pay

## 2023-01-20 DIAGNOSIS — B370 Candidal stomatitis: Secondary | ICD-10-CM

## 2023-01-20 DIAGNOSIS — J029 Acute pharyngitis, unspecified: Secondary | ICD-10-CM | POA: Diagnosis not present

## 2023-01-20 MED ORDER — FLUCONAZOLE 150 MG PO TABS: ORAL_TABLET | ORAL | 0 refills | Status: DC

## 2023-01-20 MED ORDER — PREDNISONE 20 MG PO TABS: ORAL_TABLET | ORAL | 0 refills | Status: DC

## 2023-01-20 NOTE — ED Provider Notes (Signed)
[facility]    CSN: [id number] Arrival date & time: 01/20/23  1230      History   Chief Complaint Chief Complaint  Patient presents with   Sore Throat    HPI Panfilo Ketchum is a 22 y.o. male.   HPI 22 year old male presents with sore throat and soreness of mouth due to antibiotics taken for ear pain.  PMH significant for asthma, chronic headaches, and depression.  Past Medical History:  Diagnosis Date   Acne    Allergy    Anxiety    Asthma    Chronic headaches    Depression    GERD (gastroesophageal reflux disease)     Patient Active Problem List   Diagnosis Date Noted   Left otitis media 12/17/2022   Insomnia 11/20/2022   Left ankle sprain 11/01/2022   Intentional overdose (HCC) 09/02/2022   Hypokalemia 09/02/2022   Migraine 08/22/2022   Learning disability 06/06/2021   Frequent headaches 04/06/2021   Anxiety state 04/06/2021   Chest pain 03/22/2021   Polyarthritis with positive rheumatoid factor (HCC) 09/25/2020   Diarrhea 05/30/2020   Elevated TSH 05/30/2020   Nausea and vomiting 05/30/2020   Well adult exam 05/02/2020   Back pain of thoracolumbar region 12/15/2019   Patellofemoral arthralgia of left knee 12/15/2019   First degree ankle sprain, left, initial encounter 12/15/2019   Current mild episode of major depressive disorder without prior episode (HCC) 02/16/2019   Egg allergy 08/29/2016    Past Surgical History:  Procedure Laterality Date   OPEN REDUCTION INTERNAL FIXATION (ORIF) METACARPAL Right 11/19/2018   Procedure: Right small finger metacarpal open malunion repair;  Surgeon: Bradly Bienenstock, MD;  Location:  SURGERY CENTER;  Service: Orthopedics;  Laterality: Right;  90 minutes   TYMPANOSTOMY TUBE PLACEMENT Bilateral    placed age 19 have fallen out   UPPER GASTROINTESTINAL ENDOSCOPY     WISDOM TOOTH EXTRACTION  04/04/2021       Home Medications    Prior to Admission medications   Medication Sig Start Date End  Date Taking? Authorizing Provider  fluconazole (DIFLUCAN) 150 MG tablet Take 1 tab p.o. daily x 3 days 01/20/23  Yes Trevor Iha, FNP  predniSONE (DELTASONE) 20 MG tablet Take 3 tabs PO daily x 5 days. 01/20/23  Yes Trevor Iha, FNP  albuterol (VENTOLIN HFA) 108 (90 Base) MCG/ACT inhaler Inhale 2 puffs into the lungs every 4 (four) hours as needed for wheezing or shortness of breath. 09/03/22   Glade Lloyd, MD  buPROPion (WELLBUTRIN XL) 300 MG 24 hr tablet Take 1 tablet (300 mg total) by mouth daily. 01/04/23   Everrett Coombe, DO  cetirizine (ZYRTEC) 10 MG tablet Take 10 mg by mouth daily. 12/06/22   [provider]  doxepin (SINEQUAN) 10 MG capsule TAKE 1-2 CAPSULES (10-20 MG TOTAL) BY MOUTH AT BEDTIME AS NEEDED. Patient taking differently: Take 10 mg by mouth at bedtime as needed (For anxiety). 11/20/22   Everrett Coombe, DO  Erenumab-aooe (AIMOVIG) 140 MG/ML SOAJ Inject 140 mg into the skin every 28 (twenty-eight) days. 10/29/22   Everlena Cooper, Adam R, DO  escitalopram (LEXAPRO) 20 MG tablet Take 20 mg by mouth at bedtime. 05/18/22   [provider]  fluticasone (FLOVENT HFA) 44 MCG/ACT inhaler Inhale 2 puffs into the lungs 2 (two) times daily. 10/09/22   [provider]  naproxen (NAPROSYN) 500 MG tablet Take 1 tablet (500 mg total) by mouth 2 (two) times daily with a meal. 01/15/23 01/15/24  Monica Becton, MD  omeprazole (PRILOSEC) 40 MG capsule Take 1 capsule (40 mg total) by mouth in the morning and at bedtime. 12/06/22   Everrett Coombe, DO  ondansetron (ZOFRAN-ODT) 4 MG disintegrating tablet Take 1 tablet (4 mg total) by mouth every 8 (eight) hours as needed. 12/30/22   Smitty Knudsen, PA-C  rizatriptan (MAXALT-MLT) 10 MG disintegrating tablet TAKE 1 TABLET BY MOUTH AS NEEDED FOR MIGRAINE. MAY REPEAT IN 2 HOURS IF NEEDED 09/18/22   Everrett Coombe, DO  Spacer/Aero-Holding Deretha [facility] DIAMOND) MISC  04/04/20   [provider]  topiramate (TOPAMAX) 50  MG tablet TAKE 1 TABLET BY MOUTH TWICE A DAY 11/26/22   Everrett Coombe, DO    Family History Family History  Problem Relation Age of Onset   Migraines Mother    Diabetes Father    Hyperlipidemia Father    Hypertension Father    COPD Father    Asthma Father    Migraines Brother    Stroke Maternal Grandmother    Colon cancer Neg Hx    Esophageal cancer Neg Hx    Rectal cancer Neg Hx    Stomach cancer Neg Hx     Social History Social History   Tobacco Use   Smoking status: Never   Smokeless tobacco: Never  Vaping Use   Vaping status: Former  Substance Use Topics   Alcohol use: No    Alcohol/week: 0.0 standard drinks of alcohol   Drug use: No     Allergies   Egg-derived products   Review of Systems Review of Systems   Physical Exam Triage Vital Signs ED Triage Vitals [01/20/23 1252]  Encounter Vitals Group     BP 118/82     Systolic BP Percentile      Diastolic BP Percentile      Pulse Rate 94     Resp (!) 21     Temp 98.3 F (36.8 C)     Temp Source Oral     SpO2 99 %     Weight      Height      Head Circumference      Peak Flow      Pain Score 6     Pain Loc      Pain Education      Exclude from Growth Chart    No data found.  Updated Vital Signs BP 118/82 (BP Location: Right Arm)   Pulse 94   Temp 98.3 F (36.8 C) (Oral)   Resp (!) 21   SpO2 99%    Physical Exam Vitals and nursing note reviewed.  Constitutional:      Appearance: Normal appearance. He is well-developed and normal weight. He is not ill-appearing.  HENT:     Head: Normocephalic and atraumatic.     Right Ear: Tympanic membrane, ear canal and external ear normal.     Left Ear: Tympanic membrane, ear canal and external ear normal.     Mouth/Throat:     Mouth: Mucous membranes are moist.     Pharynx: Oropharynx is clear. Uvula midline.     Comments: Creamy white patches noted over tongue with some mildly erythematous ulcerations also noted Eyes:     Conjunctiva/sclera:  Conjunctivae normal.     Pupils: Pupils are equal, round, and reactive to light.  Cardiovascular:     Rate and Rhythm: Normal rate and regular rhythm.     Pulses: Normal pulses.     Heart sounds: Normal heart  sounds. No murmur heard. Pulmonary:     Effort: Pulmonary effort is normal.     Breath sounds: Normal breath sounds. No wheezing, rhonchi or rales.  Musculoskeletal:        General: Normal range of motion.     Cervical back: Normal range of motion and neck supple.  Skin:    General: Skin is warm and dry.  Neurological:     General: No focal deficit present.     Mental Status: He is alert and oriented to person, place, and time.  Psychiatric:        Mood and Affect: Mood normal.        Behavior: Behavior normal.      UC Treatments / Results  Labs (all labs ordered are listed, but only abnormal results are displayed) Labs Reviewed - No data to display  EKG   Radiology No results found.  Procedures Procedures (including critical care time)  Medications Ordered in UC Medications - No data to display  Initial Impression / Assessment and Plan / UC Course  I have reviewed the triage vital signs and the nursing notes.  Pertinent labs & imaging results that were available during my care of the patient were reviewed by me and considered in my medical decision making (see chart for details).     MDM: 1.  Oral candidiasis-Rx'd Diflucan 150 mg tablet daily x 3 days; 2.  Sore throat-Rx'd prednisone 20 mg tablet: Take 3 tabs p.o. daily x 5 days. Advised patient to take medications as directed with food to completion.  Encouraged to increase daily water intake to 64 ounces per day while taking these medications. Advised patient to take medications as directed with food to completion.  Encouraged to increase daily water intake to 64 ounces per day while taking these medications.  Advised if symptoms worsen and/or unresolved please follow-up with PCP or here for further evaluation.   Patient discharged home, hemodynamically stable. Final Clinical Impressions(s) / UC Diagnoses   Final diagnoses:  Sore throat  Oral candidiasis     Discharge Instructions      Advised patient to take medications as directed with food to completion.  Encouraged to increase daily water intake to 64 ounces per day while taking these medications.     ED Prescriptions     Medication Sig Dispense Auth. Provider   predniSONE (DELTASONE) 20 MG tablet Take 3 tabs PO daily x 5 days. 15 tablet Trevor Iha, FNP   fluconazole (DIFLUCAN) 150 MG tablet Take 1 tab p.o. daily x 3 days 3 tablet Trevor Iha, FNP      PDMP not reviewed this encounter.   Trevor Iha, FNP 01/20/23 1320

## 2023-01-20 NOTE — ED Triage Notes (Signed)
Pt c/o sore throat for the past 2 days and soreness on his mouth due to antibiotics for ear pain.

## 2023-01-20 NOTE — Discharge Instructions (Addendum)
Advised patient to take medications as directed with food to completion.  Encouraged to increase daily water intake to 64 ounces per day while taking these medications.  Advised if symptoms worsen and/or unresolved please follow-up with PCP or here for further evaluation.

## 2023-01-22 ENCOUNTER — Encounter: Payer: Self-pay | Admitting: Sports Medicine

## 2023-01-22 ENCOUNTER — Encounter: Payer: Self-pay | Admitting: Neurology

## 2023-01-31 ENCOUNTER — Telehealth: Payer: Self-pay

## 2023-01-31 NOTE — Telephone Encounter (Signed)
Here's the insurance card  Attachments  [phone].jpg   [id number].HYW

## 2023-02-01 ENCOUNTER — Telehealth: Payer: Self-pay

## 2023-02-01 ENCOUNTER — Other Ambulatory Visit (HOSPITAL_COMMUNITY): Payer: Self-pay

## 2023-02-01 NOTE — Telephone Encounter (Signed)
PA request has been Submitted. New Encounter created for follow up. For additional info see Pharmacy Prior Auth telephone encounter from 11/15.

## 2023-02-01 NOTE — Telephone Encounter (Signed)
*  [facility]  Pharmacy Patient Advocate Encounter   Received notification from Pt Calls Messages that prior authorization for Aimovig 140MG /ML auto-injectors  is required/requested.   Insurance verification completed.   The patient is insured through [facility] .   Per test claim: PA required; PA submitted to above mentioned insurance via CoverMyMeds Key/confirmation #/EOC [id number] Status is pending

## 2023-02-04 ENCOUNTER — Other Ambulatory Visit: Payer: Self-pay

## 2023-02-04 ENCOUNTER — Other Ambulatory Visit (HOSPITAL_COMMUNITY): Payer: Self-pay

## 2023-02-04 ENCOUNTER — Ambulatory Visit: Payer: MEDICAID | Attending: Sports Medicine

## 2023-02-04 DIAGNOSIS — M25562 Pain in left knee: Secondary | ICD-10-CM | POA: Diagnosis present

## 2023-02-04 DIAGNOSIS — M6281 Muscle weakness (generalized): Secondary | ICD-10-CM | POA: Insufficient documentation

## 2023-02-04 DIAGNOSIS — R2689 Other abnormalities of gait and mobility: Secondary | ICD-10-CM | POA: Insufficient documentation

## 2023-02-04 DIAGNOSIS — G8929 Other chronic pain: Secondary | ICD-10-CM | POA: Diagnosis not present

## 2023-02-04 NOTE — Telephone Encounter (Signed)
Pharmacy Patient Advocate Encounter  Received notification from [facility] that Prior Authorization for Aimovig 140MG /ML auto-injectors has been APPROVED from 01/27/2023 to 02/01/2024   PA #/Case ID/Reference #: [id number]

## 2023-02-04 NOTE — Therapy (Signed)
OUTPATIENT PHYSICAL THERAPY LOWER EXTREMITY EVALUATION   Patient Name: Banx Schacht MRN: [id number] DOB:2000/09/12, 22 y.o., male Today's Date: 02/04/2023  END OF SESSION:  PT End of Session - 02/04/23 1015     Visit Number 1    Number of Visits 17    Date for PT Re-Evaluation 04/05/23    Authorization Type [employer]    PT Start Time 1015    PT Stop Time 1056    PT Time Calculation (min) 41 min    Activity Tolerance Patient tolerated treatment well    Behavior During Therapy WFL for tasks assessed/performed             Past Medical History:  Diagnosis Date   Acne    Allergy    Anxiety    Asthma    Chronic headaches    Depression    GERD (gastroesophageal reflux disease)    Past Surgical History:  Procedure Laterality Date   OPEN REDUCTION INTERNAL FIXATION (ORIF) METACARPAL Right 11/19/2018   Procedure: Right small finger metacarpal open malunion repair;  Surgeon: Bradly Bienenstock, MD;  [facility]  [facility];  Service: Orthopedics;  Laterality: Right;  90 minutes   TYMPANOSTOMY TUBE PLACEMENT Bilateral    placed age 63 have fallen out   UPPER GASTROINTESTINAL ENDOSCOPY     WISDOM TOOTH EXTRACTION  04/04/2021   Patient Active Problem List   Diagnosis Date Noted   Left otitis media 12/17/2022   Insomnia 11/20/2022   Left ankle sprain 11/01/2022   Intentional overdose (HCC) 09/02/2022   Hypokalemia 09/02/2022   Migraine 08/22/2022   Learning disability 06/06/2021   Frequent headaches 04/06/2021   Anxiety state 04/06/2021   Chest pain 03/22/2021   Polyarthritis with positive rheumatoid factor (HCC) 09/25/2020   Diarrhea 05/30/2020   Elevated TSH 05/30/2020   Nausea and vomiting 05/30/2020   Well adult exam 05/02/2020   Back pain of thoracolumbar region 12/15/2019   Patellofemoral arthralgia of left knee 12/15/2019   First degree ankle sprain, left, initial encounter 12/15/2019   Current mild episode of major depressive disorder without  prior episode (HCC) 02/16/2019   Egg allergy 08/29/2016    PCP: Everrett Coombe, DO  REFERRING PROVIDER: Monica Becton, MD   REFERRING DIAG: [phone] (ICD-10-CM) - Patellofemoral arthralgia of left knee   THERAPY DIAG:  Chronic pain of left knee  Other abnormalities of gait and mobility  Muscle weakness (generalized)  Rationale for Evaluation and Treatment: Rehabilitation  ONSET DATE: 2023, recent worsening October 2024   SUBJECTIVE:   SUBJECTIVE STATEMENT: Patient reports his left knee buckled in when he was walking with a sidestep maneuver in the park. This happened in 2023. He had an injection in 2023, which eased the pain, but not fully. About a month ago his pain increased to the point where he couldn't put any weight on it when walking. He does not recall any MOI for worsening of pain, he was just walking. He was wearing his sisters brace because it felt good keeping the knee extended. He saw Dr. Karie Schwalbe was issued a brace that has helped to keep the pain down. He reports both knees constantly pop, but it isn't painful. He denies feeling of the knee giving away. No numbness/tingling. 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   PERTINENT HISTORY: Learning disability (patient reports he does better with visual vs. reading comprehension for HEP)  PAIN:  Are you having pain?  Yes: NPRS scale: 3 currently; at worst 7.5/10 Pain location: Lt anterior knee Pain description: "feels like a rod is digging" Aggravating factors: twisting,cold,prolonged walking Relieving factors: brace, heat, rest  PRECAUTIONS: None  RED FLAGS: None   WEIGHT BEARING RESTRICTIONS: No  FALLS:  Has patient fallen in last 6 months? No  LIVING ENVIRONMENT: Lives with: lives with their family Lives in: House/apartment Stairs: No Has following equipment at home: None  OCCUPATION: currently not working; applying for various positions currently.    PLOF: Independent  PATIENT GOALS: "I want my knee to get some strength back in it and where it's not hurting so bad."   NEXT MD VISIT: 02/26/23  OBJECTIVE:  Note: Objective measures were completed at Evaluation unless otherwise noted.  DIAGNOSTIC FINDINGS: left knee MRI IMPRESSION: 1. No meniscal or ligamentous injury of the left knee. 2. Mild edema of the lateral aspect of the Hoffa's fat-pad as can be seen with patellar tendon-lateral femoral condyle friction syndrome.  PATIENT SURVEYS:  Patient specific functional scale:   0: unable to perform activity >>>>>>>>>10: able to perform activity at the same level as before injury   Walking > 1 mile: 5 Squatting: 6  11/2= 5.5  COGNITION: Overall cognitive status: learning disability    SENSATION: WFL  EDEMA:  No obvious swelling about the Lt knee   MUSCLE LENGTH: Hamstrings: Right lacking 32 deg; Left lacking 38 deg   POSTURE: rounded shoulders, forward head, and pes planus  PALPATION: Diffuse tenderness about anterior knee about patella   LOWER EXTREMITY ROM:  Active ROM Right eval Left eval  Hip flexion    Hip extension    Hip abduction    Hip adduction    Hip internal rotation    Hip external rotation    Knee flexion  Full pain  Knee extension  Full pain   Ankle dorsiflexion  15  Ankle plantarflexion    Ankle inversion    Ankle eversion     (Blank rows = not tested)  LOWER EXTREMITY MMT:  MMT Right eval Left eval  Hip flexion 5 5  Hip extension 5 4+  Hip abduction 5 4+  Hip adduction    Hip internal rotation    Hip external rotation    Knee flexion 5 4+ pn  Knee extension 5 4+ pn  Ankle dorsiflexion    Ankle plantarflexion    Ankle inversion    Ankle eversion     (Blank rows = not tested)  LOWER EXTREMITY SPECIAL TESTS:  (-) Valgus, (-) Varus, (-) McMurray's  (+) Hoffa's test   FUNCTIONAL TESTS:  Squat: excessive trunk flexion, arch collapse SLS: 30 seconds significant arch  collapse LLE SL squat LLE: LOB, excessive trunk flexion   GAIT: Distance walked: 10 ft  Assistive device utilized: None Level of assistance: Complete Independence Comments: excessive pronation    [facility] Adult PT Treatment:                                                DATE: 02/04/23 Therapeutic Exercise: Demonstrated and issue initial HEP.      PATIENT EDUCATION:  Education details: see treatment; POC; assessment findings  Person educated: Patient Education method: Explanation, Demonstration, Tactile cues, Verbal cues, and Handouts Education comprehension: verbalized understanding, returned demonstration, verbal cues required, tactile cues required, and needs further education  HOME EXERCISE PROGRAM:  Access Code: [email or web address] URL: [email or web address] Date: 02/04/2023 Prepared by: Letitia Libra  Exercises - Supine Hamstring Stretch with Strap  - 1 x daily - 7 x weekly - 3 sets - 30 sec  hold - Supine ITB Stretch with Strap  - 1 x daily - 7 x weekly - 3 sets - 30 sec  hold - Supine Active Straight Leg Raise  - 1 x daily - 7 x weekly - 2 sets - 10 reps - Arch Lifting  - 1 x daily - 7 x weekly - 2 sets - 10 reps  ASSESSMENT:  CLINICAL IMPRESSION: Patient is a 22 y.o. male who was seen today for physical therapy evaluation and treatment for chronic Lt knee pain with recent worsening over the past month without known cause. He has diffuse tenderness about the Lt anterior knee, hamstring/IT band tightness, excessive pronation during stance phase of gait, arch collapse during SLS suggestive of intrinsic foot weakness, mild hip/knee weakness, and aberrant mechanics with squatting activity. He will benefit from skilled PT to address the above stated deficits in order to optimize his function and assist in overall pain reduction.    OBJECTIVE IMPAIRMENTS: Abnormal gait, decreased activity tolerance, decreased endurance, decreased knowledge of condition, difficulty walking,  decreased strength, impaired flexibility, improper body mechanics, postural dysfunction, and pain.   ACTIVITY LIMITATIONS: carrying, lifting, bending, standing, squatting, and locomotion level  PARTICIPATION LIMITATIONS: community activity and yard work  PERSONAL FACTORS: Age, Fitness, Past/current experiences, Time since onset of injury/illness/exacerbation, and 3+ comorbidities: see PMH above  are also affecting patient's functional outcome.   REHAB POTENTIAL: Good  CLINICAL DECISION MAKING: Stable/uncomplicated  EVALUATION COMPLEXITY: Low   GOALS: Goals reviewed with patient? Yes  SHORT TERM GOALS: Target date: 03/04/2023     Patient will be independent and compliant with initial HEP.   Baseline:issued at eval Goal status: INITIAL  2.  Patient will improve hamstring flexibility by at least 10 degrees to reduce stress on his knee.  Baseline: see above Goal status: INITIAL  3.  Patient will maintain Lt SLS for at least 10 seconds with no arch collapse to improve LLE stability.  Baseline: 30 seconds arch collapse  Goal status: INITIAL    LONG TERM GOALS: Target date: 04/04/22  Patient will demonstrate 5/5 pain free Lt knee strength to improve stability with prolonged walking activity.  Baseline: see above Goal status: INITIAL  2.  Patient will demonstrate normalized and pain free squat mechanics.  Baseline: see above  Goal status: INITIAL  3.  Patient will report pain at worst rated as </=2/10 to reduce current functional limitations.  Baseline: 7.5 Goal status: INITIAL  4.  Patient will be independent with advanced home program to progress/maintain current level of function.  Baseline: initial HEP issued  Goal status: INITIAL     PLAN:  PT FREQUENCY: 1-2x/week  PT DURATION: 8 weeks  PLANNED INTERVENTIONS: 97164- PT Re-evaluation, 97110-Therapeutic exercises, 97530- Therapeutic activity, [id number]- Neuromuscular re-education, 97535- Self Care, 13086- Manual  therapy, [id number]- Gait training, [id number]- Aquatic Therapy, [phone]- Ionotophoresis 4mg /ml Dexamethasone, Taping, Dry Needling, Cryotherapy, and Moist heat  PLAN FOR NEXT SESSION: review and progress HEP prn; hamstring/IT band stretching, intrinsic foot strengthening, squat mechanics. Consider taping for patellofemoral pain/arch support.    Letitia Libra, PT, DPT, ATC 02/04/23 1:04 PM

## 2023-02-08 ENCOUNTER — Encounter: Payer: Self-pay | Admitting: Family Medicine

## 2023-02-10 MED ORDER — ESCITALOPRAM OXALATE 20 MG PO TABS: 20.0000 mg | ORAL_TABLET | Freq: Every day | ORAL | 1 refills | Status: DC

## 2023-02-11 ENCOUNTER — Ambulatory Visit: Payer: MEDICAID

## 2023-02-11 DIAGNOSIS — R2689 Other abnormalities of gait and mobility: Secondary | ICD-10-CM

## 2023-02-11 DIAGNOSIS — M25562 Pain in left knee: Secondary | ICD-10-CM | POA: Diagnosis not present

## 2023-02-11 DIAGNOSIS — M6281 Muscle weakness (generalized): Secondary | ICD-10-CM

## 2023-02-11 DIAGNOSIS — G8929 Other chronic pain: Secondary | ICD-10-CM

## 2023-02-11 NOTE — Therapy (Addendum)
 OUTPATIENT PHYSICAL THERAPY LOWER EXTREMITY TREATMENT PHYSICAL THERAPY DISCHARGE SUMMARY  Visits from Start of Care: 2  Current functional level related to goals / functional outcomes: No re-assessment of goals    Remaining deficits: Status unknown   Education / Equipment: N/A   Patient agrees to discharge. Patient goals were not met. Patient is being discharged due to not returning since the last visit.  Patient Name: Christopher Forbes MRN: [id number] DOB:09-05-2000, 22 y.o., male Today's Date: 02/11/2023  END OF SESSION:  PT End of Session - 02/11/23 0932     Visit Number 2    Number of Visits 17    Date for PT Re-Evaluation 04/05/23    Authorization Type [employer]    PT Start Time 0931    PT Stop Time 1010    PT Time Calculation (min) 39 min    Activity Tolerance Patient tolerated treatment well    Behavior During Therapy WFL for tasks assessed/performed              Past Medical History:  Diagnosis Date   Acne    Allergy    Anxiety    Asthma    Chronic headaches    Depression    GERD (gastroesophageal reflux disease)    Past Surgical History:  Procedure Laterality Date   OPEN REDUCTION INTERNAL FIXATION (ORIF) METACARPAL Right 11/19/2018   Procedure: Right small finger metacarpal open malunion repair;  Surgeon: Bradly Bienenstock, MD;  [facility]:  [facility];  Service: Orthopedics;  Laterality: Right;  90 minutes   TYMPANOSTOMY TUBE PLACEMENT Bilateral    placed age 3 have fallen out   UPPER GASTROINTESTINAL ENDOSCOPY     WISDOM TOOTH EXTRACTION  04/04/2021   Patient Active Problem List   Diagnosis Date Noted   Left otitis media 12/17/2022   Insomnia 11/20/2022   Left ankle sprain 11/01/2022   Intentional overdose (HCC) 09/02/2022   Hypokalemia 09/02/2022   Migraine 08/22/2022   Learning disability 06/06/2021   Frequent headaches 04/06/2021   Anxiety state 04/06/2021   Chest pain 03/22/2021   Polyarthritis with positive rheumatoid  factor (HCC) 09/25/2020   Diarrhea 05/30/2020   Elevated TSH 05/30/2020   Nausea and vomiting 05/30/2020   Well adult exam 05/02/2020   Back pain of thoracolumbar region 12/15/2019   Patellofemoral arthralgia of left knee 12/15/2019   First degree ankle sprain, left, initial encounter 12/15/2019   Current mild episode of major depressive disorder without prior episode (HCC) 02/16/2019   Egg allergy 08/29/2016    PCP: Everrett Coombe, DO  REFERRING PROVIDER: Monica Becton, MD   REFERRING DIAG: [phone] (ICD-10-CM) - Patellofemoral arthralgia of left knee   THERAPY DIAG:  Chronic pain of left knee  Other abnormalities of gait and mobility  Muscle weakness (generalized)  Rationale for Evaluation and Treatment: Rehabilitation  ONSET DATE: 2023, recent worsening October 2024   SUBJECTIVE:   SUBJECTIVE STATEMENT: "It's a little sore. I have been loading up scrap metal."   EVAL: Patient reports his left knee buckled in when he was walking with a sidestep maneuver in the park. This happened in 2023. He had an injection in 2023, which eased the pain, but not fully. About a month ago his pain increased to the point where he couldn't put any weight on it when walking. He does not recall any MOI for worsening of pain, he was just walking. He was wearing his sisters brace because it felt good keeping the knee extended. He saw Dr.  T was issued a brace that has helped to keep the pain down. He reports both knees constantly pop, but it isn't painful. He denies feeling of the knee giving away. No numbness/tingling. 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   PERTINENT HISTORY: Learning disability (patient reports he does better with visual vs. reading comprehension for HEP)  PAIN:  Are you having pain? Yes: NPRS scale: 4/10 Pain location: Lt anterior knee Pain description: sore Aggravating factors: twisting,cold,prolonged  walking Relieving factors: brace, heat, rest  PRECAUTIONS: None  RED FLAGS: None   WEIGHT BEARING RESTRICTIONS: No  FALLS:  Has patient fallen in last 6 months? No  PATIENT GOALS: "I want my knee to get some strength back in it and where it's not hurting so bad."   NEXT MD VISIT: 02/26/23  OBJECTIVE:  Note: Objective measures were completed at Evaluation unless otherwise noted.  DIAGNOSTIC FINDINGS: left knee MRI IMPRESSION: 1. No meniscal or ligamentous injury of the left knee. 2. Mild edema of the lateral aspect of the Hoffa's fat-pad as can be seen with patellar tendon-lateral femoral condyle friction syndrome.  PATIENT SURVEYS:  Patient specific functional scale:   0: unable to perform activity >>>>>>>>>10: able to perform activity at the same level as before injury   Walking > 1 mile: 5 Squatting: 6  11/2= 5.5  COGNITION: Overall cognitive status: learning disability    SENSATION: WFL  EDEMA:  No obvious swelling about the Lt knee   MUSCLE LENGTH: Hamstrings: Right lacking 32 deg; Left lacking 38 deg   POSTURE: rounded shoulders, forward head, and pes planus  PALPATION: Diffuse tenderness about anterior knee about patella   LOWER EXTREMITY ROM:  Active ROM Right eval Left eval  Hip flexion    Hip extension    Hip abduction    Hip adduction    Hip internal rotation    Hip external rotation    Knee flexion  Full pain  Knee extension  Full pain   Ankle dorsiflexion  15  Ankle plantarflexion    Ankle inversion    Ankle eversion     (Blank rows = not tested)  LOWER EXTREMITY MMT:  MMT Right eval Left eval  Hip flexion 5 5  Hip extension 5 4+  Hip abduction 5 4+  Hip adduction    Hip internal rotation    Hip external rotation    Knee flexion 5 4+ pn  Knee extension 5 4+ pn  Ankle dorsiflexion    Ankle plantarflexion    Ankle inversion    Ankle eversion     (Blank rows = not tested)  LOWER EXTREMITY SPECIAL TESTS:  (-) Valgus,  (-) Varus, (-) McMurray's  (+) Hoffa's test   FUNCTIONAL TESTS:  Squat: excessive trunk flexion, arch collapse SLS: 30 seconds significant arch collapse LLE SL squat LLE: LOB, excessive trunk flexion   GAIT: Distance walked: 10 ft  Assistive device utilized: None Level of assistance: Complete Independence Comments: excessive pronation   [facility] Adult PT Treatment:                                                DATE: 02/11/23 Therapeutic Exercise: Hamstring stretch x 1 minute  IT band stretch x 1 minute  SLR 2 x 10  Arch lift x 10 each  Hip bridge with knee extension 2 x 10  Sidelying hip abduction 2 x 10  Walking 1 lap in gym with tape donned  Manual Therapy: Rocktape Y strip distal quad to patellar tendon 25% tension; I strip 25% tension patellar tendon   [facility] Adult PT Treatment:                                                DATE: 02/04/23 Therapeutic Exercise: Demonstrated and issue initial HEP.      PATIENT EDUCATION:  Education details: HEP  Person educated: Patient Education method: Solicitor, Actor cues, and Verbal cues Education comprehension: verbalized understanding, returned demonstration, verbal cues required, and needs further education  HOME EXERCISE PROGRAM: Access Code: [email or web address] URL: [email or web address] Date: 02/04/2023 Prepared by: Letitia Libra  Exercises - Supine Hamstring Stretch with Strap  - 1 x daily - 7 x weekly - 3 sets - 30 sec  hold - Supine ITB Stretch with Strap  - 1 x daily - 7 x weekly - 3 sets - 30 sec  hold - Supine Active Straight Leg Raise  - 1 x daily - 7 x weekly - 2 sets - 10 reps - Arch Lifting  - 1 x daily - 7 x weekly - 2 sets - 10 reps  ASSESSMENT:  CLINICAL IMPRESSION: Reviewed initial HEP with patient requiring cues for setup of majority of exercises. Occasional quad lag noted with SLR requiring cues to correct. Progressed knee and hip strengthening with good tolerance, though fatiguing  quickly with gluteal strengthening. Trial of patellofemoral taping with patient reporting improved stability about Lt knee during swing phase of gait.   EVAL: Patient is a 22 y.o. male who was seen today for physical therapy evaluation and treatment for chronic Lt knee pain with recent worsening over the past month without known cause. He has diffuse tenderness about the Lt anterior knee, hamstring/IT band tightness, excessive pronation during stance phase of gait, arch collapse during SLS suggestive of intrinsic foot weakness, mild hip/knee weakness, and aberrant mechanics with squatting activity. He will benefit from skilled PT to address the above stated deficits in order to optimize his function and assist in overall pain reduction.    OBJECTIVE IMPAIRMENTS: Abnormal gait, decreased activity tolerance, decreased endurance, decreased knowledge of condition, difficulty walking, decreased strength, impaired flexibility, improper body mechanics, postural dysfunction, and pain.   ACTIVITY LIMITATIONS: carrying, lifting, bending, standing, squatting, and locomotion level  PARTICIPATION LIMITATIONS: community activity and yard work  PERSONAL FACTORS: Age, Fitness, Past/current experiences, Time since onset of injury/illness/exacerbation, and 3+ comorbidities: see PMH above  are also affecting patient's functional outcome.   REHAB POTENTIAL: Good  CLINICAL DECISION MAKING: Stable/uncomplicated  EVALUATION COMPLEXITY: Low   GOALS: Goals reviewed with patient? Yes  SHORT TERM GOALS: Target date: 03/04/2023     Patient will be independent and compliant with initial HEP.   Baseline:issued at eval Goal status: INITIAL  2.  Patient will improve hamstring flexibility by at least 10 degrees to reduce stress on his knee.  Baseline: see above Goal status: INITIAL  3.  Patient will maintain Lt SLS for at least 10 seconds with no arch collapse to improve LLE stability.  Baseline: 30 seconds arch  collapse  Goal status: INITIAL    LONG TERM GOALS: Target date: 04/04/22  Patient will demonstrate 5/5 pain free Lt knee strength  to improve stability with prolonged walking activity.  Baseline: see above Goal status: INITIAL  2.  Patient will demonstrate normalized and pain free squat mechanics.  Baseline: see above  Goal status: INITIAL  3.  Patient will report pain at worst rated as </=2/10 to reduce current functional limitations.  Baseline: 7.5 Goal status: INITIAL  4.  Patient will be independent with advanced home program to progress/maintain current level of function.  Baseline: initial HEP issued  Goal status: INITIAL     PLAN:  PT FREQUENCY: 1-2x/week  PT DURATION: 8 weeks  PLANNED INTERVENTIONS: 97164- PT Re-evaluation, 97110-Therapeutic exercises, 97530- Therapeutic activity, [id number]- Neuromuscular re-education, 97535- Self Care, 16109- Manual therapy, [id number]- Gait training, [phone]- Aquatic Therapy, [phone]- Ionotophoresis 4mg /ml Dexamethasone, Taping, Dry Needling, Cryotherapy, and Moist heat  PLAN FOR NEXT SESSION: n/a d/c    Letitia Libra, PT, DPT, ATC 02/11/23 10:12 AM  Letitia Libra, PT, DPT, ATC 06/11/23 1:29 PM

## 2023-02-12 ENCOUNTER — Ambulatory Visit (INDEPENDENT_AMBULATORY_CARE_PROVIDER_SITE_OTHER): Payer: MEDICAID | Admitting: Otolaryngology

## 2023-02-12 ENCOUNTER — Encounter (INDEPENDENT_AMBULATORY_CARE_PROVIDER_SITE_OTHER): Payer: Self-pay

## 2023-02-12 VITALS — Ht 70.0 in | Wt 150.0 lb

## 2023-02-12 DIAGNOSIS — H9202 Otalgia, left ear: Secondary | ICD-10-CM | POA: Diagnosis not present

## 2023-02-12 DIAGNOSIS — M542 Cervicalgia: Secondary | ICD-10-CM | POA: Diagnosis not present

## 2023-02-12 DIAGNOSIS — H9313 Tinnitus, bilateral: Secondary | ICD-10-CM

## 2023-02-12 DIAGNOSIS — G8929 Other chronic pain: Secondary | ICD-10-CM

## 2023-02-12 NOTE — Patient Instructions (Signed)
I have ordered an imaging study for you to complete prior to your next visit. Please call [facility] Scheduling at [phone] to schedule your imaging if you have not received a call within 24 hours. If you are unable to complete your imaging study prior to your next scheduled visit please call our office to let us know.
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
## 2023-02-16 ENCOUNTER — Other Ambulatory Visit: Payer: Self-pay

## 2023-02-16 ENCOUNTER — Ambulatory Visit
Admission: RE | Admit: 2023-02-16 | Discharge: 2023-02-16 | Disposition: A | Discharge status: Home / Self Care | Payer: MEDICAID | Source: Ambulatory Visit | Attending: Family Medicine | Admitting: Family Medicine

## 2023-02-16 VITALS — BP 129/77 | HR 82 | Temp 97.7°F | Resp 16

## 2023-02-16 DIAGNOSIS — L03113 Cellulitis of right upper limb: Secondary | ICD-10-CM | POA: Diagnosis not present

## 2023-02-16 MED ORDER — DOXYCYCLINE HYCLATE 100 MG PO CAPS: 100.0000 mg | ORAL_CAPSULE | Freq: Two times a day (BID) | ORAL | 0 refills | Status: AC

## 2023-02-16 NOTE — ED Provider Notes (Signed)
[facility]    CSN: [id number] Arrival date & time: 02/16/23  0859      History   Chief Complaint Chief Complaint  Patient presents with   Hand Problem    Entered by patient   Wound Check    HPI [name] is a 22 y.o. male.   HPI 22 year old male presents with right hand redness and swelling secondary to dog bite on Tuesday, 02/12/2023.  Patient is concerned with possible infection.  PMH significant for chronic headaches, GERD, and depression.  Past Medical History:  Diagnosis Date   Acne    Allergy    Anxiety    Asthma    Chronic headaches    Depression    GERD (gastroesophageal reflux disease)     Patient Active Problem List   Diagnosis Date Noted   Left otitis media 12/17/2022   Insomnia 11/20/2022   Left ankle sprain 11/01/2022   Intentional overdose (HCC) 09/02/2022   Hypokalemia 09/02/2022   Migraine 08/22/2022   Learning disability 06/06/2021   Frequent headaches 04/06/2021   Anxiety state 04/06/2021   Chest pain 03/22/2021   Polyarthritis with positive rheumatoid factor (HCC) 09/25/2020   Diarrhea 05/30/2020   Elevated TSH 05/30/2020   Nausea and vomiting 05/30/2020   Well adult exam 05/02/2020   Back pain of thoracolumbar region 12/15/2019   Patellofemoral arthralgia of left knee 12/15/2019   First degree ankle sprain, left, initial encounter 12/15/2019   Current mild episode of major depressive disorder without prior episode (HCC) 02/16/2019   Egg allergy 08/29/2016    Past Surgical History:  Procedure Laterality Date   OPEN REDUCTION INTERNAL FIXATION (ORIF) METACARPAL Right 11/19/2018   Procedure: Right small finger metacarpal open malunion repair;  Surgeon: Bradly Bienenstock, MD;  Location: [facility];  Service: Orthopedics;  Laterality: Right;  90 minutes   TYMPANOSTOMY TUBE PLACEMENT Bilateral    placed age 73 have fallen out   UPPER GASTROINTESTINAL ENDOSCOPY     WISDOM TOOTH EXTRACTION  04/04/2021        Home Medications    Prior to Admission medications   Medication Sig Start Date End Date Taking? Authorizing Provider  doxycycline (VIBRAMYCIN) 100 MG capsule Take 1 capsule (100 mg total) by mouth 2 (two) times daily for 5 days. 02/16/23 02/21/23 Yes Trevor Iha, FNP  albuterol (VENTOLIN HFA) 108 (90 Base) MCG/ACT inhaler Inhale 2 puffs into the lungs every 4 (four) hours as needed for wheezing or shortness of breath. 09/03/22   Glade Lloyd, MD  buPROPion (WELLBUTRIN XL) 300 MG 24 hr tablet Take 1 tablet (300 mg total) by mouth daily. 01/04/23   Everrett Coombe, DO  cetirizine (ZYRTEC) 10 MG tablet Take 10 mg by mouth daily. 12/06/22   [provider]  doxepin (SINEQUAN) 10 MG capsule TAKE 1-2 CAPSULES (10-20 MG TOTAL) BY MOUTH AT BEDTIME AS NEEDED. Patient taking differently: Take 10 mg by mouth at bedtime as needed (For anxiety). 11/20/22   Everrett Coombe, DO  Erenumab-aooe (AIMOVIG) 140 MG/ML SOAJ Inject 140 mg into the skin every 28 (twenty-eight) days. 10/29/22   Everlena Cooper, Adam R, DO  escitalopram (LEXAPRO) 20 MG tablet Take 1 tablet (20 mg total) by mouth at bedtime. 02/10/23   Everrett Coombe, DO  fluconazole (DIFLUCAN) 150 MG tablet Take 1 tab p.o. daily x 3 days Patient not taking: Reported on 02/12/2023 01/20/23   Trevor Iha, FNP  fluticasone (FLOVENT HFA) 44 MCG/ACT inhaler Inhale 2 puffs into the lungs 2 (two)  times daily. 10/09/22   [provider]  methocarbamol (ROBAXIN) 500 MG tablet Take 500-1,000 mg by mouth every 6 (six) hours as needed. 09/14/22   [provider]  naproxen (NAPROSYN) 500 MG tablet Take 1 tablet (500 mg total) by mouth 2 (two) times daily with a meal. 01/15/23 01/15/24  Monica Becton, MD  omeprazole (PRILOSEC) 40 MG capsule Take 1 capsule (40 mg total) by mouth in the morning and at bedtime. 12/06/22   Everrett Coombe, DO  ondansetron (ZOFRAN-ODT) 4 MG disintegrating tablet Take 1 tablet (4 mg total) by mouth every 8  (eight) hours as needed. 12/30/22   Smitty Knudsen, PA-C  predniSONE (DELTASONE) 20 MG tablet Take 3 tabs PO daily x 5 days. Patient not taking: Reported on 02/12/2023 01/20/23   Trevor Iha, FNP  rizatriptan (MAXALT-MLT) 10 MG disintegrating tablet TAKE 1 TABLET BY MOUTH AS NEEDED FOR MIGRAINE. MAY REPEAT IN 2 HOURS IF NEEDED 09/18/22   Everrett Coombe, DO  Spacer/Aero-Holding Deretha [facility] DIAMOND) MISC  04/04/20   [provider]  topiramate (TOPAMAX) 50 MG tablet TAKE 1 TABLET BY MOUTH TWICE A DAY 11/26/22   Everrett Coombe, DO    Family History Family History  Problem Relation Age of Onset   Migraines Mother    Diabetes Father    Hyperlipidemia Father    Hypertension Father    COPD Father    Asthma Father    Migraines Brother    Stroke Maternal Grandmother    Colon cancer Neg Hx    Esophageal cancer Neg Hx    Rectal cancer Neg Hx    Stomach cancer Neg Hx     Social History Social History   Tobacco Use   Smoking status: Never   Smokeless tobacco: Never  Vaping Use   Vaping status: Former  Substance Use Topics   Alcohol use: No    Alcohol/week: 0.0 standard drinks of alcohol   Drug use: No     Allergies   Chicken allergy, Milk-related compounds, and Egg-derived products   Review of Systems Review of Systems  Skin:        Right hand redness and swelling secondary to dog bite on Tuesday, 02/12/2023.     Physical Exam Triage Vital Signs ED Triage Vitals  Encounter Vitals Group     BP      Systolic BP Percentile      Diastolic BP Percentile      Pulse      Resp      Temp      Temp src      SpO2      Weight      Height      Head Circumference      Peak Flow      Pain Score      Pain Loc      Pain Education      Exclude from Growth Chart    No data found.  Updated Vital Signs BP 129/77   Pulse 82   Temp 97.7 F (36.5 C)   Resp 16   SpO2 98%   Physical Exam Vitals and nursing note reviewed.  Constitutional:      Appearance:  Normal appearance. He is normal weight.  HENT:     Head: Normocephalic and atraumatic.     Mouth/Throat:     Mouth: Mucous membranes are moist.     Pharynx: Oropharynx is clear.  Eyes:     Extraocular Movements: Extraocular movements  intact.     Conjunctiva/sclera: Conjunctivae normal.     Pupils: Pupils are equal, round, and reactive to light.  Cardiovascular:     Rate and Rhythm: Normal rate and regular rhythm.     Pulses: Normal pulses.     Heart sounds: Normal heart sounds.  Pulmonary:     Effort: Pulmonary effort is normal.     Breath sounds: Normal breath sounds. No wheezing, rhonchi or rales.  Musculoskeletal:        General: Normal range of motion.  Skin:    General: Skin is warm and dry.     Comments: Right hand dorsum: Well-healing scratch wounds with eschar formation, patient concerned with mild erythema surrounding please see image below  Neurological:     General: No focal deficit present.     Mental Status: He is alert and oriented to person, place, and time. Mental status is at baseline.  Psychiatric:        Mood and Affect: Mood normal.        Behavior: Behavior normal.      UC Treatments / Results  Labs (all labs ordered are listed, but only abnormal results are displayed) Labs Reviewed - No data to display  EKG   Radiology No results found.  Procedures Procedures (including critical care time)  Medications Ordered in UC Medications - No data to display  Initial Impression / Assessment and Plan / UC Course  I have reviewed the triage vital signs and the nursing notes.  Pertinent labs & imaging results that were available during my care of the patient were reviewed by me and considered in my medical decision making (see chart for details).     MDM: 1.  Cellulitis of right hand-Rx'd doxycycline 100 mg capsule: Take 1 capsule twice daily x 7 days. Advised patient to take medication as directed with food to completion.  Encouraged to increase daily  water intake to 64 ounces per day while taking this medication.  Advised if symptoms worsen and/or unresolved please follow-up with PCP or here for further evaluation.  Discharged home, hemodynamically stable. Final Clinical Impressions(s) / UC Diagnoses   Final diagnoses:  Cellulitis of right hand     Discharge Instructions      Advised patient to take medication as directed with food to completion.  Encouraged to increase daily water intake to 64 ounces per day while taking this medication.  Advised if symptoms worsen and/or unresolved please follow-up with PCP or here for further evaluation.     ED Prescriptions     Medication Sig Dispense Auth. Provider   doxycycline (VIBRAMYCIN) 100 MG capsule Take 1 capsule (100 mg total) by mouth 2 (two) times daily for 5 days. 10 capsule Trevor Iha, FNP      PDMP not reviewed this encounter.   Trevor Iha, FNP 02/16/23 1026

## 2023-02-16 NOTE — ED Triage Notes (Signed)
Pt presents to [facility] with co of dog bite to right hand from this past tuesday. Pt reports dog bite was from his sisters dog who got into it with his dog. Pt reports all dogs involved are up to date onshots. Pt has cleaned it and been applying triple antibiotic ointment.

## 2023-02-16 NOTE — Discharge Instructions (Addendum)
 Advised patient to take medication as directed with food to completion.  Encouraged to increase daily water intake to 64 ounces per day while taking this medication.  Advised if symptoms worsen and/or unresolved please follow-up with PCP or here for further evaluation.

## 2023-02-18 ENCOUNTER — Ambulatory Visit: Payer: MEDICAID | Admitting: Physical Therapy

## 2023-02-18 NOTE — Therapy (Incomplete)
OUTPATIENT PHYSICAL THERAPY LOWER EXTREMITY TREATMENT   Patient Name: Christopher Forbes MRN: [id number] DOB:2000-10-25, 22 y.o., male Today's Date: 02/18/2023  END OF SESSION:     Past Medical History:  Diagnosis Date   Acne    Allergy    Anxiety    Asthma    Chronic headaches    Depression    GERD (gastroesophageal reflux disease)    Past Surgical History:  Procedure Laterality Date   OPEN REDUCTION INTERNAL FIXATION (ORIF) METACARPAL Right 11/19/2018   Procedure: Right small finger metacarpal open malunion repair;  Surgeon: Bradly Bienenstock, MD;  Location: [facility];  Service: Orthopedics;  Laterality: Right;  90 minutes   TYMPANOSTOMY TUBE PLACEMENT Bilateral    placed age 42 have fallen out   UPPER GASTROINTESTINAL ENDOSCOPY     WISDOM TOOTH EXTRACTION  04/04/2021   Patient Active Problem List   Diagnosis Date Noted   Left otitis media 12/17/2022   Insomnia 11/20/2022   Left ankle sprain 11/01/2022   Intentional overdose (HCC) 09/02/2022   Hypokalemia 09/02/2022   Migraine 08/22/2022   Learning disability 06/06/2021   Frequent headaches 04/06/2021   Anxiety state 04/06/2021   Chest pain 03/22/2021   Polyarthritis with positive rheumatoid factor (HCC) 09/25/2020   Diarrhea 05/30/2020   Elevated TSH 05/30/2020   Nausea and vomiting 05/30/2020   Well adult exam 05/02/2020   Back pain of thoracolumbar region 12/15/2019   Patellofemoral arthralgia of left knee 12/15/2019   First degree ankle sprain, left, initial encounter 12/15/2019   Current mild episode of major depressive disorder without prior episode (HCC) 02/16/2019   Egg allergy 08/29/2016    PCP: Everrett Coombe, DO  REFERRING PROVIDER: Monica Becton, MD   REFERRING DIAG: [phone] (ICD-10-CM) - Patellofemoral arthralgia of left knee   THERAPY DIAG:  No diagnosis found.  Rationale for Evaluation and Treatment: Rehabilitation  ONSET DATE: 2023, recent worsening October 2024    SUBJECTIVE:   SUBJECTIVE STATEMENT: "It's a little sore. I have been loading up scrap metal."   EVAL: Patient reports his left knee buckled in when he was walking with a sidestep maneuver in the park. This happened in 2023. He had an injection in 2023, which eased the pain, but not fully. About a month ago his pain increased to the point where he couldn't put any weight on it when walking. He does not recall any MOI for worsening of pain, he was just walking. He was wearing his sisters brace because it felt good keeping the knee extended. He saw Dr. Karie Schwalbe was issued a brace that has helped to keep the pain down. He reports both knees constantly pop, but it isn't painful. He denies feeling of the knee giving away. No numbness/tingling. 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   PERTINENT HISTORY: Learning disability (patient reports he does better with visual vs. reading comprehension for HEP)  PAIN:  Are you having pain? Yes: NPRS scale: 4/10 Pain location: Lt anterior knee Pain description: sore Aggravating factors: twisting,cold,prolonged walking Relieving factors: brace, heat, rest  PRECAUTIONS: None  RED FLAGS: None   WEIGHT BEARING RESTRICTIONS: No  FALLS:  Has patient fallen in last 6 months? No  PATIENT GOALS: "I want my knee to get some strength back in it and where it's not hurting so bad."   NEXT MD VISIT: 02/26/23  OBJECTIVE:  Note: Objective measures were completed at Evaluation unless otherwise noted.  DIAGNOSTIC FINDINGS: left knee MRI IMPRESSION: 1. No meniscal or ligamentous injury of the left knee. 2. Mild edema of the lateral aspect of the Hoffa's fat-pad as can be seen with patellar tendon-lateral femoral condyle friction syndrome.  PATIENT SURVEYS:  Patient specific functional scale:   0: unable to perform activity >>>>>>>>>10: able to perform activity at the same level as before injury   Walking > 1  mile: 5 Squatting: 6  11/2= 5.5  COGNITION: Overall cognitive status: learning disability    SENSATION: WFL  EDEMA:  No obvious swelling about the Lt knee   MUSCLE LENGTH: Hamstrings: Right lacking 32 deg; Left lacking 38 deg   POSTURE: rounded shoulders, forward head, and pes planus  PALPATION: Diffuse tenderness about anterior knee about patella   LOWER EXTREMITY ROM:  Active ROM Right eval Left eval  Hip flexion    Hip extension    Hip abduction    Hip adduction    Hip internal rotation    Hip external rotation    Knee flexion  Full pain  Knee extension  Full pain   Ankle dorsiflexion  15  Ankle plantarflexion    Ankle inversion    Ankle eversion     (Blank rows = not tested)  LOWER EXTREMITY MMT:  MMT Right eval Left eval  Hip flexion 5 5  Hip extension 5 4+  Hip abduction 5 4+  Hip adduction    Hip internal rotation    Hip external rotation    Knee flexion 5 4+ pn  Knee extension 5 4+ pn  Ankle dorsiflexion    Ankle plantarflexion    Ankle inversion    Ankle eversion     (Blank rows = not tested)  LOWER EXTREMITY SPECIAL TESTS:  (-) Valgus, (-) Varus, (-) McMurray's  (+) Hoffa's test   FUNCTIONAL TESTS:  Squat: excessive trunk flexion, arch collapse SLS: 30 seconds significant arch collapse LLE SL squat LLE: LOB, excessive trunk flexion   GAIT: Distance walked: 10 ft  Assistive device utilized: None Level of assistance: Complete Independence Comments: excessive pronation   [facility] Adult PT Treatment:                                                DATE: 02/11/23 Therapeutic Exercise: Hamstring stretch x 1 minute  IT band stretch x 1 minute  SLR 2 x 10  Arch lift x 10 each  Hip bridge with knee extension 2 x 10  Sidelying hip abduction 2 x 10  Walking 1 lap in gym with tape donned  Manual Therapy: Rocktape Y strip distal quad to patellar tendon 25% tension; I strip 25% tension patellar tendon   [facility] Adult PT Treatment:                                                 DATE: 02/04/23 Therapeutic Exercise: Demonstrated and issue initial HEP.      PATIENT EDUCATION:  Education details: HEP  Person educated: Patient Education method: Solicitor, Actor cues, and Verbal cues Education comprehension: verbalized understanding, returned demonstration, verbal cues required, and needs further education  HOME EXERCISE PROGRAM: Access Code: [email or web address] URL: [email or web address] Date: 02/04/2023 Prepared by: Letitia Libra  Exercises -  Supine Hamstring Stretch with Strap  - 1 x daily - 7 x weekly - 3 sets - 30 sec  hold - Supine ITB Stretch with Strap  - 1 x daily - 7 x weekly - 3 sets - 30 sec  hold - Supine Active Straight Leg Raise  - 1 x daily - 7 x weekly - 2 sets - 10 reps - Arch Lifting  - 1 x daily - 7 x weekly - 2 sets - 10 reps  ASSESSMENT:  CLINICAL IMPRESSION: Reviewed initial HEP with patient requiring cues for setup of majority of exercises. Occasional quad lag noted with SLR requiring cues to correct. Progressed knee and hip strengthening with good tolerance, though fatiguing quickly with gluteal strengthening. Trial of patellofemoral taping with patient reporting improved stability about Lt knee during swing phase of gait.   EVAL: Patient is a 22 y.o. male who was seen today for physical therapy evaluation and treatment for chronic Lt knee pain with recent worsening over the past month without known cause. He has diffuse tenderness about the Lt anterior knee, hamstring/IT band tightness, excessive pronation during stance phase of gait, arch collapse during SLS suggestive of intrinsic foot weakness, mild hip/knee weakness, and aberrant mechanics with squatting activity. He will benefit from skilled PT to address the above stated deficits in order to optimize his function and assist in overall pain reduction.    OBJECTIVE IMPAIRMENTS: Abnormal gait, decreased activity tolerance,  decreased endurance, decreased knowledge of condition, difficulty walking, decreased strength, impaired flexibility, improper body mechanics, postural dysfunction, and pain.   ACTIVITY LIMITATIONS: carrying, lifting, bending, standing, squatting, and locomotion level  PARTICIPATION LIMITATIONS: community activity and yard work  PERSONAL FACTORS: Age, Fitness, Past/current experiences, Time since onset of injury/illness/exacerbation, and 3+ comorbidities: see PMH above  are also affecting patient's functional outcome.   REHAB POTENTIAL: Good  CLINICAL DECISION MAKING: Stable/uncomplicated  EVALUATION COMPLEXITY: Low   GOALS: Goals reviewed with patient? Yes  SHORT TERM GOALS: Target date: 03/04/2023     Patient will be independent and compliant with initial HEP.   Baseline:issued at eval Goal status: INITIAL  2.  Patient will improve hamstring flexibility by at least 10 degrees to reduce stress on his knee.  Baseline: see above Goal status: INITIAL  3.  Patient will maintain Lt SLS for at least 10 seconds with no arch collapse to improve LLE stability.  Baseline: 30 seconds arch collapse  Goal status: INITIAL    LONG TERM GOALS: Target date: 04/04/22  Patient will demonstrate 5/5 pain free Lt knee strength to improve stability with prolonged walking activity.  Baseline: see above Goal status: INITIAL  2.  Patient will demonstrate normalized and pain free squat mechanics.  Baseline: see above  Goal status: INITIAL  3.  Patient will report pain at worst rated as </=2/10 to reduce current functional limitations.  Baseline: 7.5 Goal status: INITIAL  4.  Patient will be independent with advanced home program to progress/maintain current level of function.  Baseline: initial HEP issued  Goal status: INITIAL     PLAN:  PT FREQUENCY: 1-2x/week  PT DURATION: 8 weeks  PLANNED INTERVENTIONS: 97164- PT Re-evaluation, 97110-Therapeutic exercises, 97530- Therapeutic  activity, [id number]- Neuromuscular re-education, 97535- Self Care, [id number]- Manual therapy, [id number]- Gait training, [phone]- Aquatic Therapy, [phone]- Ionotophoresis 4mg /ml Dexamethasone, Taping, Dry Needling, Cryotherapy, and Moist heat  PLAN FOR NEXT SESSION: review and progress HEP prn; hamstring/IT band stretching, intrinsic foot strengthening, squat mechanics. Tape response    Letitia Libra, PT,  DPT, ATC 02/18/23 7:11 AM

## 2023-02-21 ENCOUNTER — Ambulatory Visit: Payer: MEDICAID | Attending: Sports Medicine

## 2023-02-21 DIAGNOSIS — G8929 Other chronic pain: Secondary | ICD-10-CM | POA: Insufficient documentation

## 2023-02-21 DIAGNOSIS — R2689 Other abnormalities of gait and mobility: Secondary | ICD-10-CM | POA: Insufficient documentation

## 2023-02-21 DIAGNOSIS — M25562 Pain in left knee: Secondary | ICD-10-CM | POA: Insufficient documentation

## 2023-02-21 DIAGNOSIS — M6281 Muscle weakness (generalized): Secondary | ICD-10-CM | POA: Insufficient documentation

## 2023-02-21 NOTE — Therapy (Incomplete)
OUTPATIENT PHYSICAL THERAPY LOWER EXTREMITY TREATMENT   Patient Name: Christopher Forbes MRN: [id number] DOB:05-23-2000, 22 y.o., male Today's Date: 02/21/2023  END OF SESSION:     Past Medical History:  Diagnosis Date   Acne    Allergy    Anxiety    Asthma    Chronic headaches    Depression    GERD (gastroesophageal reflux disease)    Past Surgical History:  Procedure Laterality Date   OPEN REDUCTION INTERNAL FIXATION (ORIF) METACARPAL Right 11/19/2018   Procedure: Right small finger metacarpal open malunion repair;  Surgeon: Bradly Bienenstock, MD;  Location: [facility];  Service: Orthopedics;  Laterality: Right;  90 minutes   TYMPANOSTOMY TUBE PLACEMENT Bilateral    placed age 47 have fallen out   UPPER GASTROINTESTINAL ENDOSCOPY     WISDOM TOOTH EXTRACTION  04/04/2021   Patient Active Problem List   Diagnosis Date Noted   Left otitis media 12/17/2022   Insomnia 11/20/2022   Left ankle sprain 11/01/2022   Intentional overdose (HCC) 09/02/2022   Hypokalemia 09/02/2022   Migraine 08/22/2022   Learning disability 06/06/2021   Frequent headaches 04/06/2021   Anxiety state 04/06/2021   Chest pain 03/22/2021   Polyarthritis with positive rheumatoid factor (HCC) 09/25/2020   Diarrhea 05/30/2020   Elevated TSH 05/30/2020   Nausea and vomiting 05/30/2020   Well adult exam 05/02/2020   Back pain of thoracolumbar region 12/15/2019   Patellofemoral arthralgia of left knee 12/15/2019   First degree ankle sprain, left, initial encounter 12/15/2019   Current mild episode of major depressive disorder without prior episode (HCC) 02/16/2019   Egg allergy 08/29/2016    PCP: Everrett Coombe, DO  REFERRING PROVIDER: Monica Becton, MD   REFERRING DIAG: [phone] (ICD-10-CM) - Patellofemoral arthralgia of left knee   THERAPY DIAG:  No diagnosis found.  Rationale for Evaluation and Treatment: Rehabilitation  ONSET DATE: 2023, recent worsening October 2024    SUBJECTIVE:   SUBJECTIVE STATEMENT: "It's a little sore. I have been loading up scrap metal."   EVAL: Patient reports his left knee buckled in when he was walking with a sidestep maneuver in the park. This happened in 2023. He had an injection in 2023, which eased the pain, but not fully. About a month ago his pain increased to the point where he couldn't put any weight on it when walking. He does not recall any MOI for worsening of pain, he was just walking. He was wearing his sisters brace because it felt good keeping the knee extended. He saw Dr. Karie Schwalbe was issued a brace that has helped to keep the pain down. He reports both knees constantly pop, but it isn't painful. He denies feeling of the knee giving away. No numbness/tingling. 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   PERTINENT HISTORY: Learning disability (patient reports he does better with visual vs. reading comprehension for HEP)  PAIN:  Are you having pain? Yes: NPRS scale: 4/10 Pain location: Lt anterior knee Pain description: sore Aggravating factors: twisting,cold,prolonged walking Relieving factors: brace, heat, rest  PRECAUTIONS: None  RED FLAGS: None   WEIGHT BEARING RESTRICTIONS: No  FALLS:  Has patient fallen in last 6 months? No  PATIENT GOALS: "I want my knee to get some strength back in it and where it's not hurting so bad."   NEXT MD VISIT: 02/26/23  OBJECTIVE:  Note: Objective measures were completed at Evaluation unless otherwise noted.  DIAGNOSTIC FINDINGS: left knee MRI IMPRESSION: 1. No meniscal or ligamentous injury of the left knee. 2. Mild edema of the lateral aspect of the Hoffa's fat-pad as can be seen with patellar tendon-lateral femoral condyle friction syndrome.  PATIENT SURVEYS:  Patient specific functional scale:   0: unable to perform activity >>>>>>>>>10: able to perform activity at the same level as before injury   Walking > 1  mile: 5 Squatting: 6  11/2= 5.5  COGNITION: Overall cognitive status: learning disability    SENSATION: WFL  EDEMA:  No obvious swelling about the Lt knee   MUSCLE LENGTH: Hamstrings: Right lacking 32 deg; Left lacking 38 deg   POSTURE: rounded shoulders, forward head, and pes planus  PALPATION: Diffuse tenderness about anterior knee about patella   LOWER EXTREMITY ROM:  Active ROM Right eval Left eval  Hip flexion    Hip extension    Hip abduction    Hip adduction    Hip internal rotation    Hip external rotation    Knee flexion  Full pain  Knee extension  Full pain   Ankle dorsiflexion  15  Ankle plantarflexion    Ankle inversion    Ankle eversion     (Blank rows = not tested)  LOWER EXTREMITY MMT:  MMT Right eval Left eval  Hip flexion 5 5  Hip extension 5 4+  Hip abduction 5 4+  Hip adduction    Hip internal rotation    Hip external rotation    Knee flexion 5 4+ pn  Knee extension 5 4+ pn  Ankle dorsiflexion    Ankle plantarflexion    Ankle inversion    Ankle eversion     (Blank rows = not tested)  LOWER EXTREMITY SPECIAL TESTS:  (-) Valgus, (-) Varus, (-) McMurray's  (+) Hoffa's test   FUNCTIONAL TESTS:  Squat: excessive trunk flexion, arch collapse SLS: 30 seconds significant arch collapse LLE SL squat LLE: LOB, excessive trunk flexion   GAIT: Distance walked: 10 ft  Assistive device utilized: None Level of assistance: Complete Independence Comments: excessive pronation   [facility] Adult PT Treatment:                                                DATE: 02/11/23 Therapeutic Exercise: Hamstring stretch x 1 minute  IT band stretch x 1 minute  SLR 2 x 10  Arch lift x 10 each  Hip bridge with knee extension 2 x 10  Sidelying hip abduction 2 x 10  Walking 1 lap in gym with tape donned  Manual Therapy: Rocktape Y strip distal quad to patellar tendon 25% tension; I strip 25% tension patellar tendon   [facility] Adult PT Treatment:                                                 DATE: 02/04/23 Therapeutic Exercise: Demonstrated and issue initial HEP.      PATIENT EDUCATION:  Education details: HEP  Person educated: Patient Education method: Solicitor, Actor cues, and Verbal cues Education comprehension: verbalized understanding, returned demonstration, verbal cues required, and needs further education  HOME EXERCISE PROGRAM: Access Code: [email or web address] URL: [email or web address] Date: 02/04/2023 Prepared by: Letitia Libra  Exercises -  Supine Hamstring Stretch with Strap  - 1 x daily - 7 x weekly - 3 sets - 30 sec  hold - Supine ITB Stretch with Strap  - 1 x daily - 7 x weekly - 3 sets - 30 sec  hold - Supine Active Straight Leg Raise  - 1 x daily - 7 x weekly - 2 sets - 10 reps - Arch Lifting  - 1 x daily - 7 x weekly - 2 sets - 10 reps  ASSESSMENT:  CLINICAL IMPRESSION: Reviewed initial HEP with patient requiring cues for setup of majority of exercises. Occasional quad lag noted with SLR requiring cues to correct. Progressed knee and hip strengthening with good tolerance, though fatiguing quickly with gluteal strengthening. Trial of patellofemoral taping with patient reporting improved stability about Lt knee during swing phase of gait.   EVAL: Patient is a 22 y.o. male who was seen today for physical therapy evaluation and treatment for chronic Lt knee pain with recent worsening over the past month without known cause. He has diffuse tenderness about the Lt anterior knee, hamstring/IT band tightness, excessive pronation during stance phase of gait, arch collapse during SLS suggestive of intrinsic foot weakness, mild hip/knee weakness, and aberrant mechanics with squatting activity. He will benefit from skilled PT to address the above stated deficits in order to optimize his function and assist in overall pain reduction.    OBJECTIVE IMPAIRMENTS: Abnormal gait, decreased activity tolerance,  decreased endurance, decreased knowledge of condition, difficulty walking, decreased strength, impaired flexibility, improper body mechanics, postural dysfunction, and pain.   ACTIVITY LIMITATIONS: carrying, lifting, bending, standing, squatting, and locomotion level  PARTICIPATION LIMITATIONS: community activity and yard work  PERSONAL FACTORS: Age, Fitness, Past/current experiences, Time since onset of injury/illness/exacerbation, and 3+ comorbidities: see PMH above  are also affecting patient's functional outcome.   REHAB POTENTIAL: Good  CLINICAL DECISION MAKING: Stable/uncomplicated  EVALUATION COMPLEXITY: Low   GOALS: Goals reviewed with patient? Yes  SHORT TERM GOALS: Target date: 03/04/2023     Patient will be independent and compliant with initial HEP.   Baseline:issued at eval Goal status: INITIAL  2.  Patient will improve hamstring flexibility by at least 10 degrees to reduce stress on his knee.  Baseline: see above Goal status: INITIAL  3.  Patient will maintain Lt SLS for at least 10 seconds with no arch collapse to improve LLE stability.  Baseline: 30 seconds arch collapse  Goal status: INITIAL    LONG TERM GOALS: Target date: 04/04/22  Patient will demonstrate 5/5 pain free Lt knee strength to improve stability with prolonged walking activity.  Baseline: see above Goal status: INITIAL  2.  Patient will demonstrate normalized and pain free squat mechanics.  Baseline: see above  Goal status: INITIAL  3.  Patient will report pain at worst rated as </=2/10 to reduce current functional limitations.  Baseline: 7.5 Goal status: INITIAL  4.  Patient will be independent with advanced home program to progress/maintain current level of function.  Baseline: initial HEP issued  Goal status: INITIAL     PLAN:  PT FREQUENCY: 1-2x/week  PT DURATION: 8 weeks  PLANNED INTERVENTIONS: 97164- PT Re-evaluation, 97110-Therapeutic exercises, 97530- Therapeutic  activity, [id number]- Neuromuscular re-education, 97535- Self Care, 95284- Manual therapy, [id number]- Gait training, [phone]- Aquatic Therapy, [phone]- Ionotophoresis 4mg /ml Dexamethasone, Taping, Dry Needling, Cryotherapy, and Moist heat  PLAN FOR NEXT SESSION: review and progress HEP prn; hamstring/IT band stretching, intrinsic foot strengthening, squat mechanics. Tape response    Letitia Libra, PT,  DPT, ATC 02/21/23 8:52 AM

## 2023-02-22 ENCOUNTER — Ambulatory Visit (HOSPITAL_COMMUNITY)
Admission: RE | Admit: 2023-02-22 | Discharge: 2023-02-22 | Disposition: A | Discharge status: Home / Self Care | Payer: MEDICAID | Source: Ambulatory Visit | Attending: Otolaryngology | Admitting: Otolaryngology

## 2023-02-22 DIAGNOSIS — M542 Cervicalgia: Secondary | ICD-10-CM | POA: Diagnosis present

## 2023-02-22 DIAGNOSIS — G8929 Other chronic pain: Secondary | ICD-10-CM | POA: Diagnosis present

## 2023-02-22 DIAGNOSIS — H9202 Otalgia, left ear: Secondary | ICD-10-CM | POA: Diagnosis present

## 2023-02-22 MED ORDER — IOHEXOL 350 MG/ML SOLN
75.0000 mL | Freq: Once | INTRAVENOUS | Status: AC | PRN
  Administered 2023-02-22: 75 mL via INTRAVENOUS

## 2023-02-26 ENCOUNTER — Ambulatory Visit: Payer: MEDICAID | Admitting: Sports Medicine

## 2023-03-04 ENCOUNTER — Ambulatory Visit (HOSPITAL_COMMUNITY)
Admission: EM | Admit: 2023-03-04 | Discharge: 2023-03-05 | Disposition: A | Discharge status: Other Acute Inpatient Hospital | Payer: MEDICAID | Attending: Addiction Medicine | Admitting: Addiction Medicine

## 2023-03-04 DIAGNOSIS — R45851 Suicidal ideations: Secondary | ICD-10-CM | POA: Insufficient documentation

## 2023-03-04 DIAGNOSIS — F322 Major depressive disorder, single episode, severe without psychotic features: Secondary | ICD-10-CM | POA: Insufficient documentation

## 2023-03-04 DIAGNOSIS — Z79899 Other long term (current) drug therapy: Secondary | ICD-10-CM | POA: Insufficient documentation

## 2023-03-04 DIAGNOSIS — F419 Anxiety disorder, unspecified: Secondary | ICD-10-CM | POA: Insufficient documentation

## 2023-03-04 DIAGNOSIS — Z5989 Other problems related to housing and economic circumstances: Secondary | ICD-10-CM | POA: Insufficient documentation

## 2023-03-04 LAB — COMPREHENSIVE METABOLIC PANEL
ALT: 23 U/L (ref 0–44)
AST: 24 U/L (ref 15–41)
Albumin: 4.8 g/dL (ref 3.5–5.0)
Alkaline Phosphatase: 55 U/L (ref 38–126)
Anion gap: 7 (ref 5–15)
BUN: 16 mg/dL (ref 6–20)
CO2: 24 mmol/L (ref 22–32)
Calcium: 9.7 mg/dL (ref 8.9–10.3)
Chloride: 108 mmol/L (ref 98–111)
Creatinine, Ser: 1.03 mg/dL (ref 0.61–1.24)
GFR, Estimated: >60 mL/min (ref >60)
Glucose, Bld: 84 mg/dL (ref 70–99)
Potassium: 3.5 mmol/L (ref 3.5–5.1)
Sodium: 139 mmol/L (ref 135–145)
Total Bilirubin: 0.7 mg/dL (ref <1.2)
Total Protein: 7.9 g/dL (ref 6.5–8.1)

## 2023-03-04 LAB — URINALYSIS, ROUTINE W REFLEX MICROSCOPIC
Bilirubin Urine: NEGATIVE
Glucose, UA: NEGATIVE mg/dL
Hgb urine dipstick: NEGATIVE
Ketones, ur: NEGATIVE mg/dL
Leukocytes,Ua: NEGATIVE
Nitrite: NEGATIVE
Protein, ur: NEGATIVE mg/dL
Specific Gravity, Urine: 1.021 (ref 1.005–1.030)
pH: 5 (ref 5.0–8.0)

## 2023-03-04 LAB — CBC WITH DIFFERENTIAL/PLATELET
Abs Immature Granulocytes: 0.02 10*3/uL (ref 0.00–0.07)
Basophils Absolute: 0 10*3/uL (ref 0.0–0.1)
Basophils Relative: 1 %
Eosinophils Absolute: 0 10*3/uL (ref 0.0–0.5)
Eosinophils Relative: 0 %
HCT: 46 % (ref 39.0–52.0)
Hemoglobin: 15.7 g/dL (ref 13.0–17.0)
Immature Granulocytes: 0 %
Lymphocytes Relative: 19 %
Lymphs Abs: 1.1 10*3/uL (ref 0.7–4.0)
MCH: 30.8 pg (ref 26.0–34.0)
MCHC: 34.1 g/dL (ref 30.0–36.0)
MCV: 90.4 fL (ref 80.0–100.0)
Monocytes Absolute: 0.4 10*3/uL (ref 0.1–1.0)
Monocytes Relative: 8 %
Neutro Abs: 4.2 10*3/uL (ref 1.7–7.7)
Neutrophils Relative %: 72 %
Platelets: 220 10*3/uL (ref 150–400)
RBC: 5.09 MIL/uL (ref 4.22–5.81)
RDW: 11.9 % (ref 11.5–15.5)
WBC: 5.8 10*3/uL (ref 4.0–10.5)
nRBC: 0 % (ref 0.0–0.2)

## 2023-03-04 LAB — POCT URINE DRUG SCREEN - MANUAL ENTRY (I-SCREEN)
POC Amphetamine UR: NOT DETECTED
POC Buprenorphine (BUP): NOT DETECTED
POC Cocaine UR: NOT DETECTED
POC Marijuana UR: NOT DETECTED
POC Methadone UR: NOT DETECTED
POC Methamphetamine UR: NOT DETECTED
POC Morphine: NOT DETECTED
POC Oxazepam (BZO): NOT DETECTED
POC Oxycodone UR: NOT DETECTED
POC Secobarbital (BAR): NOT DETECTED

## 2023-03-04 LAB — HEMOGLOBIN A1C
Hgb A1c MFr Bld: 4.9 % (ref 4.8–5.6)
Mean Plasma Glucose: 93.93 mg/dL

## 2023-03-04 LAB — TSH: TSH: 2.307 u[IU]/mL (ref 0.350–4.500)

## 2023-03-04 MED ORDER — ALUM & MAG HYDROXIDE-SIMETH 200-200-20 MG/5ML PO SUSP: 30.0000 mL | ORAL | Status: DC | PRN

## 2023-03-04 MED ORDER — ESCITALOPRAM OXALATE 10 MG PO TABS
20.0000 mg | ORAL_TABLET | Freq: Every day | ORAL | Status: DC
  Administered 2023-03-05: 20 mg via ORAL
  Filled 2023-03-04: qty 2

## 2023-03-04 MED ORDER — BUPROPION HCL ER (XL) 300 MG PO TB24
300.0000 mg | ORAL_TABLET | Freq: Every day | ORAL | Status: DC
  Administered 2023-03-05: 300 mg via ORAL
  Filled 2023-03-04: qty 1

## 2023-03-04 MED ORDER — OLANZAPINE 10 MG IM SOLR: 5.0000 mg | Freq: Four times a day (QID) | INTRAMUSCULAR | Status: DC | PRN

## 2023-03-04 MED ORDER — MAGNESIUM HYDROXIDE 400 MG/5ML PO SUSP: 30.0000 mL | Freq: Every day | ORAL | Status: DC | PRN

## 2023-03-04 MED ORDER — ACETAMINOPHEN 325 MG PO TABS: 650.0000 mg | ORAL_TABLET | Freq: Four times a day (QID) | ORAL | Status: DC | PRN

## 2023-03-04 MED ORDER — OLANZAPINE 5 MG PO TBDP: 5.0000 mg | ORAL_TABLET | Freq: Four times a day (QID) | ORAL | Status: DC | PRN

## 2023-03-04 NOTE — ED Notes (Signed)
Patient observed/assessed in bed/chair resting quietly appearing in no distress and verbalizing no complaints at this time. Will continue to monitor.  
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
## 2023-03-04 NOTE — ED Notes (Signed)
Patient A&Ox4. Patient present with SI to walk in traffic. Patient denies HI and AVH. Patient denies any physical complaints when asked. No acute distress noted. Support and encouragement provided. Routine safety checks conducted according to facility protocol. Encouraged patient to notify staff if thoughts of harm toward self or others arise. Patient verbalize understanding and agreement. Will continue to monitor for safety.
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
## 2023-03-05 DIAGNOSIS — R45851 Suicidal ideations: Secondary | ICD-10-CM | POA: Diagnosis not present

## 2023-03-05 LAB — POC SARS CORONAVIRUS 2 AG: SARSCOV2ONAVIRUS 2 AG: NEGATIVE

## 2023-03-05 MED ORDER — ESCITALOPRAM OXALATE 20 MG PO TABS: 20.0000 mg | ORAL_TABLET | Freq: Every day | ORAL | Status: DC

## 2023-03-05 NOTE — ED Notes (Signed)
Pt awakens to verbal prompts and follows commands. He states that he "feels the same" as last night.  Pt's  mother called and this information was relayed to pt.

## 2023-03-05 NOTE — Discharge Instructions (Signed)
Discharge from [facility] to [facility] for psychiatric stabilization  Activity: as tolerated  Diet: heart healthy  Other: -Follow-up with your outpatient psychiatric provider -instructions on appointment date, time, and address (location) are provided to you in discharge paperwork.  -Take your psychiatric medications as prescribed at discharge - instructions are provided to you in the discharge paperwork  -If you are prescribed an atypical antipsychotic medication, we recommend that your outpatient psychiatrist follow routine screening for side effects within 3 months of discharge, including monitoring: AIMS scale, height, weight, blood pressure, fasting lipid panel, HbA1c, and fasting blood sugar.   -Recommend total abstinence from alcohol, tobacco, and other illicit drug use at discharge.   -If your psychiatric symptoms recur, worsen, or if you have side effects to your psychiatric medications, call your outpatient psychiatric provider, [phone], [phone] or go to the nearest emergency department.  -If suicidal thoughts occur, immediately call your outpatient psychiatric provider, 911, [phone] or go to the nearest emergency department.
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
## 2023-03-05 NOTE — ED Notes (Signed)
Patient observed/assessed in bed/chair resting quietly appearing in no distress and verbalizing no complaints at this time. Will continue to monitor.  

## 2023-03-05 NOTE — Progress Notes (Signed)
LCSW Progress Note  [id number]   Christopher Forbes  03/05/2023  11:45 AM  Description:   Inpatient Psychiatric Referral  Patient was recommended inpatient per Ambrose Finland MD  There are no available beds at [facility], per [facility] Rona Ravens RN. Patient was referred to the following out of network facilities:   Destination  Service Provider Address Phone Fax  [facility] [address] [place] [address] [phone]  [facility] [address], [place] [address] [phone]  [facility] [address] [place] [address] [phone]  [facility] [address], [place] [address] [phone]  [facility] [address] [place] [address] [phone]  [facility] EFAX [address], [place] [phone]  [facility] [address] [place] [address] [phone]  [facility] [address] [place] [address] [phone]  [facility] [address] [place] [address] [phone]  [facility] [address] [place] [address] [phone]  [facility] [address] [place], [place] [address] [phone]  [facility] [address], [place] [address] [phone]  [facility] Patient Placement [facility], [place] [phone]  [facility] [address] [place] [address] [phone]  [facility] [address], [place] [address] [phone]  [facility] [address] [place] [address] [phone]      Situation ongoing, [name] to continue following and update chart as more information becomes available.      [name],  MSW, LCSW  03/05/2023 11:45 AM

## 2023-03-05 NOTE — Progress Notes (Signed)
Pt has been accepted to [facility] on 03/05/2023 Bed assignment: Unit 700   Pt meets inpatient criteria per: Ambrose Finland MD  Attending Physician will be: Sherrian Divers MD  Report can be called to: [phone]  Pt can arrive anytime today ASAP   Care Team Notified: [facility], Danika Burlene Arnt [facility] MD,Eric Isbanioly RN    [place] Bianney Rockwood LCSW-A   03/05/2023 12:06 PM

## 2023-03-05 NOTE — ED Notes (Signed)
Report called to Research officer, trade union at [facility] 700 hall.   Verbalized understanding.  Pt informed of bed and agrees to go vol.  Safe transport also called for transportation.

## 2023-03-25 ENCOUNTER — Ambulatory Visit: Payer: MEDICAID | Admitting: Family Medicine

## 2023-04-08 ENCOUNTER — Telehealth (INDEPENDENT_AMBULATORY_CARE_PROVIDER_SITE_OTHER): Payer: Self-pay | Admitting: Otolaryngology

## 2023-04-08 NOTE — Telephone Encounter (Signed)
Reminder Call: Date: 04/09/2023 Status: Sch  Time: 9:00 AM [address] [place], [place] [address]  Called to confirm w/patient. Patient cancelled appt and will reschedule at later time.      Date: 04/09/2023 Status: Sch  Time: 8:30 AM [address] [place], [place] [address]  Called to confirm w/patient. Patient cancelled appt and will reschedule at later time.

## 2023-04-09 ENCOUNTER — Ambulatory Visit (INDEPENDENT_AMBULATORY_CARE_PROVIDER_SITE_OTHER): Payer: MEDICAID | Admitting: Audiology

## 2023-04-09 ENCOUNTER — Ambulatory Visit (INDEPENDENT_AMBULATORY_CARE_PROVIDER_SITE_OTHER): Payer: MEDICAID

## 2023-04-18 ENCOUNTER — Other Ambulatory Visit (HOSPITAL_COMMUNITY): Payer: Self-pay

## 2023-04-19 ENCOUNTER — Other Ambulatory Visit (HOSPITAL_COMMUNITY): Payer: Self-pay

## 2023-04-22 ENCOUNTER — Other Ambulatory Visit (HOSPITAL_COMMUNITY): Payer: Self-pay

## 2023-04-23 ENCOUNTER — Other Ambulatory Visit (HOSPITAL_COMMUNITY): Payer: Self-pay

## 2023-04-24 ENCOUNTER — Other Ambulatory Visit (HOSPITAL_COMMUNITY): Payer: Self-pay

## 2023-04-26 ENCOUNTER — Other Ambulatory Visit (HOSPITAL_COMMUNITY): Payer: Self-pay

## 2023-04-26 MED ORDER — ESCITALOPRAM OXALATE 20 MG PO TABS
20.0000 mg | ORAL_TABLET | Freq: Every day | ORAL | 1 refills | Status: DC
  Filled 2023-04-26 – 2023-05-15 (×3): qty 90, 90d supply, fill #0
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
## 2023-05-01 ENCOUNTER — Encounter: Payer: Self-pay | Admitting: Neurology

## 2023-05-01 ENCOUNTER — Ambulatory Visit: Payer: MEDICAID | Admitting: Neurology

## 2023-05-03 ENCOUNTER — Emergency Department (HOSPITAL_COMMUNITY): Payer: MEDICAID

## 2023-05-03 ENCOUNTER — Other Ambulatory Visit: Payer: Self-pay

## 2023-05-03 ENCOUNTER — Emergency Department (HOSPITAL_COMMUNITY)
Admission: EM | Admit: 2023-05-03 | Discharge: 2023-05-03 | Disposition: A | Discharge status: Home / Self Care | Payer: MEDICAID | Attending: Emergency Medicine | Admitting: Emergency Medicine

## 2023-05-03 DIAGNOSIS — Y9285 Railroad track as the place of occurrence of the external cause: Secondary | ICD-10-CM | POA: Insufficient documentation

## 2023-05-03 DIAGNOSIS — S93402A Sprain of unspecified ligament of left ankle, initial encounter: Secondary | ICD-10-CM | POA: Diagnosis not present

## 2023-05-03 DIAGNOSIS — W1842XA Slipping, tripping and stumbling without falling due to stepping into hole or opening, initial encounter: Secondary | ICD-10-CM | POA: Insufficient documentation

## 2023-05-03 DIAGNOSIS — M25572 Pain in left ankle and joints of left foot: Secondary | ICD-10-CM | POA: Diagnosis present

## 2023-05-03 NOTE — Discharge Instructions (Addendum)
Return if any problems.

## 2023-05-03 NOTE — ED Triage Notes (Signed)
Pt c/o of left ankle pain. States that last night he step wrong on a railroad track and believes that either it could be sprained or fractures. C/o pain 8/10.Denies any cp, sob.

## 2023-05-03 NOTE — ED Provider Notes (Signed)
 [place] EMERGENCY DEPARTMENT AT [facility] Provider Note   CSN: [id number] Arrival date & time: 05/03/23  1218     History  Chief Complaint  Patient presents with   Ankle Pain    Christopher Forbes is a 23 y.o. male.  Patient reports that he stepped in a hole crossing over a railroad track and turned his foot and ankle.  Patient complains of swelling and pain to his left foot and his left ankle.  Patient reports pain with ambulating.  Patient has crutches and he has been using them to get around.  The history is provided by the patient.  Ankle Pain      Home Medications Prior to Admission medications   Medication Sig Start Date End Date Taking? Authorizing Provider  albuterol (VENTOLIN HFA) 108 (90 Base) MCG/ACT inhaler Inhale 2 puffs into the lungs every 4 (four) hours as needed for wheezing or shortness of breath. 09/03/22   Christopher Lloyd, Forbes  buPROPion (WELLBUTRIN XL) 300 MG 24 hr tablet Take 1 tablet (300 mg total) by mouth daily. 01/04/23   Christopher Coombe, Forbes  Erenumab-aooe (AIMOVIG) 140 MG/ML SOAJ Inject 140 mg into the skin every 28 (twenty-eight) days. 10/29/22   Christopher Forbes  escitalopram (LEXAPRO) 20 MG tablet Take 1 tablet (20 mg total) by mouth daily. 03/06/23   Christopher Forbes  escitalopram (LEXAPRO) 20 MG tablet Take 1 tablet (20 mg total) by mouth daily. 02/10/23   Christopher Coombe, Forbes  fluticasone (FLONASE) 50 MCG/ACT nasal spray Place 2 sprays into both nostrils daily.    Provider, Historical, Forbes  naproxen (NAPROSYN) 500 MG tablet Take 1 tablet (500 mg total) by mouth 2 (two) times daily with a meal. 01/15/23 01/15/24  Christopher Forbes  omeprazole (PRILOSEC) 40 MG capsule Take 1 capsule (40 mg total) by mouth in the morning and at bedtime. 12/06/22   Christopher Coombe, Forbes      Allergies    Chicken allergy, Milk-related compounds, and Egg-derived products    Review of Systems   Review of Systems  All other systems reviewed  and are negative.   Physical Exam Updated Vital Signs BP 137/89 (BP Location: Right Arm)   Pulse 80   Temp 97.9 F (36.6 C) (Oral)   Resp 16   Ht 5\' 10"  (1.778 m)   Wt 68 kg   SpO2 100%   BMI 21.52 kg/m  Physical Exam Vitals and nursing note reviewed.  Constitutional:      Appearance: He is well-developed.  HENT:     Head: Normocephalic.  Cardiovascular:     Rate and Rhythm: Normal rate.  Pulmonary:     Effort: Pulmonary effort is normal.  Abdominal:     General: There is no distension.  Musculoskeletal:        General: Swelling and tenderness present.     Comments: Tender left ankle and left foot at first MCP joint.  Full range of motion neurovascular neurosensory intact  Skin:    General: Skin is warm.  Neurological:     General: No focal deficit present.     Mental Status: He is alert and oriented to person, place, and time.     ED Results / Procedures / Treatments   Labs (all labs ordered are listed, but only abnormal results are displayed) Labs Reviewed - No data to display  EKG None  Radiology DG Foot Complete Left Result Date: 05/03/2023 CLINICAL DATA:  Stepped wrong on a  railroad track last night. Fall. Left ankle and foot pain. EXAM: LEFT ANKLE COMPLETE - 3+ VIEW; LEFT FOOT - COMPLETE 3+ VIEW COMPARISON:  Left ankle radiographs 12/15/2019 FINDINGS: Left ankle: The ankle mortise is symmetric and intact. Joint spaces are preserved. The cortices are intact. No acute fracture or dislocation. Left foot: Normal bone mineralization. Joint spaces are preserved. Normal alignment. No acute fracture or dislocation. IMPRESSION: No acute fracture or dislocation of the left ankle or foot. Electronically Signed   By: Christopher Forbes M.D.   On: 05/03/2023 17:09   DG Ankle Complete Left Result Date: 05/03/2023 CLINICAL DATA:  Stepped wrong on a railroad track last night. Fall. Left ankle and foot pain. EXAM: LEFT ANKLE COMPLETE - 3+ VIEW; LEFT FOOT - COMPLETE 3+ VIEW  COMPARISON:  Left ankle radiographs 12/15/2019 FINDINGS: Left ankle: The ankle mortise is symmetric and intact. Joint spaces are preserved. The cortices are intact. No acute fracture or dislocation. Left foot: Normal bone mineralization. Joint spaces are preserved. Normal alignment. No acute fracture or dislocation. IMPRESSION: No acute fracture or dislocation of the left ankle or foot. Electronically Signed   By: Christopher Forbes M.D.   On: 05/03/2023 17:09    Procedures Procedures    Medications Ordered in ED Medications - No data to display  ED Course/ Medical Decision Making/ A&P                                 Medical Decision Making Patient complains of pain in his ankle and foot after turning his foot.  Amount and/or Complexity of Data Reviewed Radiology: ordered and independent interpretation performed. Decision-making details documented in ED Course.    Details: X-ray left ankle and left foot no fracture  Risk OTC drugs. Risk Details: Patient placed in a ASO.  Patient advised to follow-up with orthopedist if pain persist.           Final Clinical Impression(s) / ED Diagnoses Final diagnoses:  Sprain of left ankle, unspecified ligament, initial encounter    Rx / DC Orders ED Discharge Orders     None     An After Visit Summary was printed and given to the patient.    Osie Cheeks 05/03/23 2008    Rondel Baton, Forbes 05/06/23 0900

## 2023-05-07 ENCOUNTER — Ambulatory Visit: Payer: Self-pay | Admitting: Nurse Practitioner

## 2023-05-07 VITALS — BP 138/81 | HR 93 | Temp 98.7°F | Resp 16 | Ht 70.0 in | Wt 161.0 lb

## 2023-05-07 DIAGNOSIS — S93402D Sprain of unspecified ligament of left ankle, subsequent encounter: Secondary | ICD-10-CM

## 2023-05-07 DIAGNOSIS — G8929 Other chronic pain: Secondary | ICD-10-CM

## 2023-05-07 NOTE — Progress Notes (Signed)
 New Pt. HIPAA form signed. Pt reports spraining his ankle 05/02/23 and was seen at [facility] 05/03/23 Pt has ankle brace in place, donated shoes given with more support. Attempted to make follow up appt. With orthopedic provider on AV[place]. Put currently has phone but will lose cell service soon. Appt made for 05/08/23 at 0930. With Dr. Victorino Dike. Pt given bus pass to go pick up meds from pharmacy and bus pass for appt tomorrow. F/U with [facility] PR[facility].

## 2023-05-07 NOTE — Progress Notes (Signed)
 Office Visit  Subjective   Patient ID: Christopher Forbes   DOB: Aug 06, 2000   Age: 23 y.o.   MRN: [id number]   Chief Complaint Ankle pain, recent left ankle sprain 05/02/2023, left knee pain worsened after injury to ankle.      History of Present Illness Mr. Christopher Forbes was recently diagnosed with left ankle sprain 05/03/2023 after he turned his ankle while running across the road. Patient reports pain 9/10 with most movement since yesterday. He states he has not re-injured the ankle but he is unhoused and has to constantly walk during the day and has to sleep sitting up at [facility] at the [facility]. Pt was given an ankle brace and advised to follow up with ortho if pain increases. Left knee pain which is a chronic injury has increased since the ankle sprain.     Past Medical History Past Medical History:  Diagnosis Date   Acne    Allergy    Anxiety    Asthma    Chronic headaches    Depression    GERD (gastroesophageal reflux disease)      Allergies Allergies  Allergen Reactions   Chicken Allergy Other (See Comments)    Tears stomach up   Milk-Related Compounds Diarrhea and Other (See Comments)    Tears stomach up    Egg-Derived Products Nausea And Vomiting     Medications  Current Outpatient Medications:    albuterol (VENTOLIN HFA) 108 (90 Base) MCG/ACT inhaler, Inhale 2 puffs into the lungs every 4 (four) hours as needed for wheezing or shortness of breath., Disp: , Rfl:    buPROPion (WELLBUTRIN XL) 300 MG 24 hr tablet, Take 1 tablet (300 mg total) by mouth daily., Disp: 90 tablet, Rfl: 1   Erenumab-aooe (AIMOVIG) 140 MG/ML SOAJ, Inject 140 mg into the skin every 28 (twenty-eight) days., Disp: 1 mL, Rfl: 11   escitalopram (LEXAPRO) 20 MG tablet, Take 1 tablet (20 mg total) by mouth daily., Disp: , Rfl:    escitalopram (LEXAPRO) 20 MG tablet, Take 1 tablet (20 mg total) by mouth daily., Disp: 90 tablet, Rfl: 1   fluticasone (FLONASE) 50 MCG/ACT nasal spray, Place 2  sprays into both nostrils daily., Disp: , Rfl:    naproxen (NAPROSYN) 500 MG tablet, Take 1 tablet (500 mg total) by mouth 2 (two) times daily with a meal., Disp: 60 tablet, Rfl: 3   omeprazole (PRILOSEC) 40 MG capsule, Take 1 capsule (40 mg total) by mouth in the morning and at bedtime., Disp: 180 capsule, Rfl: 0   Review of Systems Review of Systems  Constitutional: Negative.   HENT: Negative.    Eyes: Negative.   Respiratory:         History of asthma since childhood. Inhaler use every day, he has rx waiting for corticosteroid inhaler.  No problems with asthma recently.   Musculoskeletal:  Positive for joint pain.       History polyarthritis with positive rheumatoid factor (HCC), knee injury approximately 2 years ago. Today ankle and joint pain.   Skin: Negative.   Neurological: Negative.   Psychiatric/Behavioral:  Positive for depression and substance abuse.        Intermittent marijuana and alcohol use. Anxiety.        Objective:    Vitals 138/81, 93, 98.7   Physical Examination Physical Exam Constitutional:      Appearance: Normal appearance.  HENT:     Head: Normocephalic and atraumatic.  Eyes:     Pupils: Pupils  are equal, round, and reactive to light.  Cardiovascular:     Rate and Rhythm: Normal rate and regular rhythm.     Pulses: Normal pulses.  Pulmonary:     Breath sounds: Normal breath sounds.  Musculoskeletal:        General: Swelling present.     Cervical back: Normal range of motion.     Comments: Left toes appeared slightly swollen. Cap refill <2 all digits. Pt has normal ROM of toes, he is able to flex and extend foot. Limited ROM lateral foot movements due to pain. Slight swelling noted medial ankle, no visible bruising. Pt able to bear weight on left foot but it is painful.  Currently using crutches that he found.    Skin:    General: Skin is warm and dry.     Capillary Refill: Capillary refill takes less than 2 seconds.  Neurological:     Mental  Status: He is alert and oriented to person, place, and time.  Psychiatric:        Mood and Affect: Mood normal.        Behavior: Behavior normal.       Assessment & Plan:   Ankle and Knee Pain: Referral to ortho clinic for increased pain and follow up for increased knee pain.  Continue to follow discharge instructions from last Thursday.  Use discharge note to ask [facility] if you can remove shoe and brace and elevate at night. Use ibuprofen as directed and needed for pain with food. Follow up with mobile health next week if you are on site.    Bus passes given so patient can go to appointment and pharmacy to pick up medicine on file.        Kittie Plater, NP

## 2023-05-07 NOTE — Patient Instructions (Addendum)
 Appointment made for [facility] on 05/08/23 at 0930 with Dr. Victorino Dike Address: [address] [place], [place] [address] [phone]

## 2023-05-08 ENCOUNTER — Other Ambulatory Visit (HOSPITAL_COMMUNITY): Payer: Self-pay

## 2023-05-13 ENCOUNTER — Ambulatory Visit: Payer: MEDICAID | Admitting: Physician Assistant

## 2023-05-13 ENCOUNTER — Encounter: Payer: Self-pay | Admitting: Physician Assistant

## 2023-05-13 ENCOUNTER — Other Ambulatory Visit (HOSPITAL_COMMUNITY): Payer: Self-pay

## 2023-05-13 VITALS — BP 119/68 | HR 80 | Temp 97.8°F | Resp 18

## 2023-05-13 DIAGNOSIS — M25512 Pain in left shoulder: Secondary | ICD-10-CM | POA: Diagnosis not present

## 2023-05-13 DIAGNOSIS — F17210 Nicotine dependence, cigarettes, uncomplicated: Secondary | ICD-10-CM

## 2023-05-13 MED ORDER — METHYLPREDNISOLONE ACETATE 80 MG/ML IJ SUSP
80.0000 mg | Freq: Once | INTRAMUSCULAR | Status: AC
  Administered 2023-05-13: 80 mg via INTRAMUSCULAR

## 2023-05-13 MED ORDER — KETOROLAC TROMETHAMINE 60 MG/2ML IM SOLN
60.0000 mg | Freq: Once | INTRAMUSCULAR | Status: AC
  Administered 2023-05-13: 60 mg via INTRAMUSCULAR

## 2023-05-13 MED ORDER — CYCLOBENZAPRINE HCL 5 MG PO TABS
5.0000 mg | ORAL_TABLET | Freq: Three times a day (TID) | ORAL | 0 refills | Status: DC | PRN
  Filled 2023-05-13: qty 30, 10d supply, fill #0

## 2023-05-13 NOTE — Progress Notes (Signed)
 New Patient Office Visit  Subjective    Patient ID: Christopher Forbes, male    DOB: [birth year]  Age: 23 y.o. MRN: [id number]  CC:  Chief Complaint  Patient presents with   Shoulder Pain   Discussed the use of AI scribe software for clinical note transcription with the patient, who gave verbal consent to proceed.  History of Present Illness         The patient, with a history of asthma, presents with left shoulder pain for about a week. He describes the pain as severe, limiting his neck mobility and requiring him to turn his entire body. He denies any specific injury or strain, but suspects it may have started after stretching. The pain has progressively worsened, and he denies any associated numbness or tingling. He has experienced similar shoulder pain in the past, possibly related to a pulled muscle. He has attempted to manage the pain with ibuprofen (400mg ) and stretching, but reports minimal relief. He plans to try a topical analgesic (Tiger Balm) for additional relief.  The patient also reports starting to smoke about two months ago, despite having asthma. He smokes one to two cigarettes a day, mainly for the taste. 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 He has not been taking his prescribed medications, including an albuterol inhaler, for about two months. He reports that his medications, including Lexapro, Wellbutrin, and a migraine shot, have been refilled and sent to the pharmacy.    Outpatient Encounter Medications as of 05/13/2023  Medication Sig   cyclobenzaprine (FLEXERIL) 5 MG tablet Take 1 tablet (5 mg total) by mouth 3 (three) times daily as needed for muscle spasms.   albuterol (VENTOLIN HFA) 108 (90 Base) MCG/ACT inhaler Inhale 2 puffs into the lungs every 4 (four) hours as needed for wheezing or shortness of breath.   buPROPion (WELLBUTRIN XL) 300 MG 24 hr tablet Take 1 tablet (300 mg total) by mouth daily.   Erenumab-aooe  (AIMOVIG) 140 MG/ML SOAJ Inject 140 mg into the skin every 28 (twenty-eight) days.   escitalopram (LEXAPRO) 20 MG tablet Take 1 tablet (20 mg total) by mouth daily.   escitalopram (LEXAPRO) 20 MG tablet Take 1 tablet (20 mg total) by mouth daily.   fluticasone (FLONASE) 50 MCG/ACT nasal spray Place 2 sprays into both nostrils daily.   naproxen (NAPROSYN) 500 MG tablet Take 1 tablet (500 mg total) by mouth 2 (two) times daily with a meal.   omeprazole (PRILOSEC) 40 MG capsule Take 1 capsule (40 mg total) by mouth in the morning and at bedtime.   [EXPIRED] ketorolac (TORADOL) injection 60 mg    [EXPIRED] methylPREDNISolone acetate (DEPO-MEDROL) injection 80 mg    No facility-administered encounter medications on file as of 05/13/2023.    Past Medical History:  Diagnosis Date   Acne    Allergy    Anxiety    Asthma    Chronic headaches    Depression    GERD (gastroesophageal reflux disease)     Past Surgical History:  Procedure Laterality Date   OPEN REDUCTION INTERNAL FIXATION (ORIF) METACARPAL Right 11/19/2018   Procedure: Right small finger metacarpal open malunion repair;  Surgeon: Bradly Bienenstock, MD;  Location: [facility];  Service: Orthopedics;  Laterality: Right;  90 minutes   TYMPANOSTOMY TUBE PLACEMENT Bilateral    placed age 86 have fallen out   UPPER GASTROINTESTINAL ENDOSCOPY     WISDOM TOOTH EXTRACTION  04/04/2021  Family History  Problem Relation Age of Onset   Migraines Mother    Diabetes Father    Hyperlipidemia Father    Hypertension Father    COPD Father    Asthma Father    Migraines Brother    Stroke Maternal Grandmother    Colon cancer Neg Hx    Esophageal cancer Neg Hx    Rectal cancer Neg Hx    Stomach cancer Neg Hx     Social History   Socioeconomic History   Marital status: Single    Spouse name: Not on file   Number of children: Not on file   Years of education: Not on file   Highest education level: 11th grade  Occupational  History   Occupation: Consulting civil engineer  Tobacco Use   Smoking status: Every Day    Types: Cigarettes   Smokeless tobacco: Never   Tobacco comments:    1 -2 cigs a day for the past two months   Vaping Use   Vaping status: Former  Substance and Sexual Activity   Alcohol use: No    Alcohol/week: 0.0 standard drinks of alcohol   Drug use: No   Sexual activity: Yes    Partners: Male  Other Topics Concern   Not on file  Social History Narrative   Patient lives with brother step sister and step dad Transport planner. Mom is involved. Right handed   Social Drivers of Health   Financial Resource Strain: High Risk (02/25/2023)   Overall Financial Resource Strain (CARDIA)    Difficulty of Paying Living Expenses: Very hard  Food Insecurity: Food Insecurity Present (02/25/2023)   Hunger Vital Sign    Worried About Running Out of Food in the Last Year: Sometimes true    Ran Out of Food in the Last Year: Sometimes true  Transportation Needs: No Transportation Needs (02/25/2023)   PRAPARE - Administrator, Civil Service (Medical): No    Lack of Transportation (Non-Medical): No  Physical Activity: Insufficiently Active (02/25/2023)   Exercise Vital Sign    Days of Exercise per Week: 3 days    Minutes of Exercise per Session: 30 min  Stress: Stress Concern Present (02/25/2023)   [employer] of Occupational Health - Occupational Stress Questionnaire    Feeling of Stress : To some extent  Social Connections: Socially Isolated (02/25/2023)   Social Connection and Isolation Panel [NHANES]    Frequency of Communication with Friends and Family: Twice a week    Frequency of Social Gatherings with Friends and Family: Once a week    Attends Religious Services: Never    Database administrator or Organizations: No    Attends Engineer, structural: Not on file    Marital Status: Separated  Intimate Partner Violence: Not At Risk (09/02/2022)   Humiliation, Afraid, Rape, and Kick questionnaire     Fear of Current or Ex-Partner: No    Emotionally Abused: No    Physically Abused: No    Sexually Abused: No    Review of Systems  Constitutional: Negative.   HENT: Negative.    Eyes: Negative.   Respiratory:  Negative for shortness of breath.   Cardiovascular:  Negative for chest pain.  Gastrointestinal: Negative.   Genitourinary: Negative.   Musculoskeletal:  Positive for joint pain and myalgias.  Skin: Negative.   Neurological: Negative.   Endo/Heme/Allergies: Negative.         Objective    BP 119/68 (BP Location: Right Arm, Patient Position: Sitting)  Pulse 80   Temp 97.8 F (36.6 C)   Resp 18   SpO2 99%   Physical Exam Vitals and nursing note reviewed.  Constitutional:      Appearance: Normal appearance.  HENT:     Head: Normocephalic and atraumatic.     Right Ear: External ear normal.     Left Ear: External ear normal.     Nose: Nose normal.     Mouth/Throat:     Mouth: Mucous membranes are moist.     Pharynx: Oropharynx is clear.  Eyes:     Extraocular Movements: Extraocular movements intact.     Conjunctiva/sclera: Conjunctivae normal.     Pupils: Pupils are equal, round, and reactive to light.  Cardiovascular:     Rate and Rhythm: Normal rate and regular rhythm.     Pulses: Normal pulses.     Heart sounds: Normal heart sounds.  Pulmonary:     Effort: Pulmonary effort is normal.     Breath sounds: Normal breath sounds.  Musculoskeletal:     Right shoulder: Normal.     Left shoulder: Tenderness present. No swelling. Decreased range of motion. Decreased strength.     Right upper arm: Normal.     Left upper arm: Normal.     Right hand: Normal.     Left hand: Normal.     Cervical back: Normal range of motion and neck supple.  Skin:    General: Skin is warm and dry.  Neurological:     General: No focal deficit present.     Mental Status: He is alert.  Psychiatric:        Mood and Affect: Mood normal.        Behavior: Behavior normal.         Thought Content: Thought content normal.        Judgment: Judgment normal.        Assessment & Plan:   Problem List Items Addressed This Visit   None Visit Diagnoses       Acute pain of left shoulder    -  Primary   Relevant Medications   cyclobenzaprine (FLEXERIL) 5 MG tablet   methylPREDNISolone acetate (DEPO-MEDROL) injection 80 mg (Completed)   ketorolac (TORADOL) injection 60 mg (Completed)      1. Acute pain of left shoulder (Primary) Severe pain for one week, limiting range of motion and causing discomfort. No numbness or tingling. History of similar pain due to muscle strain. No relief with ibuprofen 400mg . Red flags given for prompt reevaluation . -Administer steroid and pain injections today. -Prescribe  muscle relaxer. -Advise gentle stretching and avoidance of sleeping on the affected side. -If no improvement, recommend orthopedic consultation in [place]. - cyclobenzaprine (FLEXERIL) 5 MG tablet; Take 1 tablet (5 mg total) by mouth 3 (three) times daily as needed for muscle spasms.  Dispense: 30 tablet; Refill: 0 - methylPREDNISolone acetate (DEPO-MEDROL) injection 80 mg - ketorolac (TORADOL) injection 60 mg   I have reviewed the patient's medical history (PMH, PSH, Social History, Family History, Medications, and allergies) , and have been updated if relevant. I spent 30 minutes reviewing chart and  face to face time with patient.   Return if symptoms worsen or fail to improve.   Kasandra Knudsen Mayers, PA-C

## 2023-05-13 NOTE — Patient Instructions (Signed)
 VISIT SUMMARY:  Today, we addressed your left shoulder pain, recent tobacco use, and asthma management. You have been experiencing severe shoulder pain for a week, which has limited your neck mobility. Additionally, you have started smoking recently despite having asthma and have not been taking your prescribed medications.  YOUR PLAN:  -LEFT SHOULDER PAIN: Left shoulder pain can be caused by muscle strain or other issues. We administered steroid and pain injections today and prescribed a low dose muscle relaxer. Please continue with gentle stretching and avoid sleeping on the affected side. If there is no improvement, consider seeing an orthopedic specialist in [place].  -TOBACCO USE: Smoking can worsen asthma and other health conditions. We strongly encourage you to quit smoking to improve your overall health.  -GENERAL HEALTH MAINTENANCE: Make sure you have access to all your prescribed medications when you move to [place]. This includes Lexapro, Wellbutrin, and your migraine shot.  INSTRUCTIONS:  If your shoulder pain does not improve, please seek an orthopedic consultation in [place].  Roney Jaffe, PA-C Physician Assistant [facility] [email or web address]

## 2023-05-15 ENCOUNTER — Other Ambulatory Visit (HOSPITAL_COMMUNITY): Payer: Self-pay

## 2023-06-20 ENCOUNTER — Ambulatory Visit (HOSPITAL_COMMUNITY)
Admission: EM | Admit: 2023-06-20 | Discharge: 2023-06-20 | Disposition: A | Discharge status: Other Acute Inpatient Hospital | Payer: MEDICAID | Attending: Behavioral Health | Admitting: Behavioral Health

## 2023-06-20 DIAGNOSIS — R45851 Suicidal ideations: Secondary | ICD-10-CM | POA: Insufficient documentation

## 2023-06-20 DIAGNOSIS — F332 Major depressive disorder, recurrent severe without psychotic features: Secondary | ICD-10-CM | POA: Insufficient documentation

## 2023-06-20 DIAGNOSIS — Z5902 Unsheltered homelessness: Secondary | ICD-10-CM | POA: Diagnosis not present

## 2023-06-20 LAB — TSH: TSH: 2.263 u[IU]/mL (ref 0.350–4.500)

## 2023-06-20 LAB — CBC WITH DIFFERENTIAL/PLATELET
Abs Immature Granulocytes: 0.05 10*3/uL (ref 0.00–0.07)
Basophils Absolute: 0 10*3/uL (ref 0.0–0.1)
Basophils Relative: 1 %
Eosinophils Absolute: 0.1 10*3/uL (ref 0.0–0.5)
Eosinophils Relative: 1 %
HCT: 47.2 % (ref 39.0–52.0)
Hemoglobin: 15.4 g/dL (ref 13.0–17.0)
Immature Granulocytes: 1 %
Lymphocytes Relative: 35 %
Lymphs Abs: 1.8 10*3/uL (ref 0.7–4.0)
MCH: 30 pg (ref 26.0–34.0)
MCHC: 32.6 g/dL (ref 30.0–36.0)
MCV: 91.8 fL (ref 80.0–100.0)
Monocytes Absolute: 0.5 10*3/uL (ref 0.1–1.0)
Monocytes Relative: 10 %
Neutro Abs: 2.8 10*3/uL (ref 1.7–7.7)
Neutrophils Relative %: 52 %
Platelets: 212 10*3/uL (ref 150–400)
RBC: 5.14 MIL/uL (ref 4.22–5.81)
RDW: 12.2 % (ref 11.5–15.5)
WBC: 5.2 10*3/uL (ref 4.0–10.5)
nRBC: 0 % (ref 0.0–0.2)

## 2023-06-20 LAB — COMPREHENSIVE METABOLIC PANEL WITH GFR
ALT: 45 U/L — ABNORMAL HIGH (ref 0–44)
AST: 26 U/L (ref 15–41)
Albumin: 4.4 g/dL (ref 3.5–5.0)
Alkaline Phosphatase: 49 U/L (ref 38–126)
Anion gap: 9 (ref 5–15)
BUN: 6 mg/dL (ref 6–20)
CO2: 29 mmol/L (ref 22–32)
Calcium: 9.6 mg/dL (ref 8.9–10.3)
Chloride: 100 mmol/L (ref 98–111)
Creatinine, Ser: 0.84 mg/dL (ref 0.61–1.24)
GFR, Estimated: >60 mL/min (ref >60)
Glucose, Bld: 84 mg/dL (ref 70–99)
Potassium: 3.8 mmol/L (ref 3.5–5.1)
Sodium: 138 mmol/L (ref 135–145)
Total Bilirubin: 0.8 mg/dL (ref 0.0–1.2)
Total Protein: 6.9 g/dL (ref 6.5–8.1)

## 2023-06-20 LAB — POCT URINE DRUG SCREEN - MANUAL ENTRY (I-SCREEN)
POC Amphetamine UR: NOT DETECTED
POC Buprenorphine (BUP): NOT DETECTED
POC Cocaine UR: NOT DETECTED
POC Marijuana UR: POSITIVE — AB
POC Methadone UR: NOT DETECTED
POC Methamphetamine UR: NOT DETECTED
POC Morphine: NOT DETECTED
POC Oxazepam (BZO): NOT DETECTED
POC Oxycodone UR: NOT DETECTED
POC Secobarbital (BAR): NOT DETECTED

## 2023-06-20 LAB — LIPID PANEL
Cholesterol: 231 mg/dL — ABNORMAL HIGH (ref 0–200)
HDL: 46 mg/dL (ref >40)
LDL Cholesterol: 141 mg/dL — ABNORMAL HIGH (ref 0–99)
Total CHOL/HDL Ratio: 5 ratio
Triglycerides: 221 mg/dL — ABNORMAL HIGH (ref <150)
VLDL: 44 mg/dL — ABNORMAL HIGH (ref 0–40)

## 2023-06-20 LAB — ETHANOL: Alcohol, Ethyl (B): <10 mg/dL (ref <10)

## 2023-06-20 MED ORDER — ALBUTEROL SULFATE HFA 108 (90 BASE) MCG/ACT IN AERS: 2.0000 | INHALATION_SPRAY | RESPIRATORY_TRACT | Status: DC | PRN

## 2023-06-20 MED ORDER — LORAZEPAM 2 MG/ML IJ SOLN
2.0000 mg | Freq: Three times a day (TID) | INTRAMUSCULAR | Status: DC | PRN
Start: 2023-06-20 — End: 2023-06-21

## 2023-06-20 MED ORDER — HYDROXYZINE HCL 25 MG PO TABS: 25.0000 mg | ORAL_TABLET | Freq: Three times a day (TID) | ORAL | Status: DC | PRN

## 2023-06-20 MED ORDER — MAGNESIUM HYDROXIDE 400 MG/5ML PO SUSP: 30.0000 mL | Freq: Every day | ORAL | Status: DC | PRN

## 2023-06-20 MED ORDER — BUPROPION HCL ER (XL) 150 MG PO TB24: 150.0000 mg | ORAL_TABLET | Freq: Every day | ORAL | Status: DC

## 2023-06-20 MED ORDER — DIPHENHYDRAMINE HCL 50 MG/ML IJ SOLN
50.0000 mg | Freq: Three times a day (TID) | INTRAMUSCULAR | Status: DC | PRN
Start: 2023-06-20 — End: 2023-06-21

## 2023-06-20 MED ORDER — TRAZODONE HCL 50 MG PO TABS
50.0000 mg | ORAL_TABLET | Freq: Every evening | ORAL | Status: DC | PRN
  Administered 2023-06-20: 50 mg via ORAL
  Filled 2023-06-20: qty 1

## 2023-06-20 MED ORDER — HALOPERIDOL 5 MG PO TABS: 5.0000 mg | ORAL_TABLET | Freq: Three times a day (TID) | ORAL | Status: DC | PRN

## 2023-06-20 MED ORDER — LORAZEPAM 2 MG/ML IJ SOLN: 2.0000 mg | Freq: Three times a day (TID) | INTRAMUSCULAR | Status: DC | PRN

## 2023-06-20 MED ORDER — HALOPERIDOL LACTATE 5 MG/ML IJ SOLN: 10.0000 mg | Freq: Three times a day (TID) | INTRAMUSCULAR | Status: DC | PRN

## 2023-06-20 MED ORDER — HALOPERIDOL LACTATE 5 MG/ML IJ SOLN: 5.0000 mg | Freq: Three times a day (TID) | INTRAMUSCULAR | Status: DC | PRN

## 2023-06-20 MED ORDER — ACETAMINOPHEN 325 MG PO TABS: 650.0000 mg | ORAL_TABLET | Freq: Four times a day (QID) | ORAL | Status: DC | PRN

## 2023-06-20 MED ORDER — DIPHENHYDRAMINE HCL 50 MG/ML IJ SOLN: 50.0000 mg | Freq: Three times a day (TID) | INTRAMUSCULAR | Status: DC | PRN

## 2023-06-20 MED ORDER — ALUM & MAG HYDROXIDE-SIMETH 200-200-20 MG/5ML PO SUSP: 30.0000 mL | ORAL | Status: DC | PRN

## 2023-06-20 MED ORDER — DIPHENHYDRAMINE HCL 50 MG PO CAPS: 50.0000 mg | ORAL_CAPSULE | Freq: Three times a day (TID) | ORAL | Status: DC | PRN
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
## 2023-06-20 NOTE — ED Provider Notes (Signed)
 Behavioral Health Urgent Care Medical Screening Exam  Patient Name: Christopher Forbes MRN: [id number] Date of Evaluation: 06/20/23 Chief Complaint:  SI Diagnosis:  Final diagnoses:  Severe episode of recurrent major depressive disorder, without psychotic features (HCC)  Suicidal ideation    History of Present illness: Christopher Forbes is a 23 y.o. male patient with a past psychiatric history significant for MDD and GAD who presented to the [facility] voluntarily with complaints of worsening depression and suicidal ideations.  Patient reports that it was his case manager at the [facility] idea for him to come here today for a psychiatric evaluation. He reports to having really bad suicidal thoughts for quite some time that worsened over the past couple days. He describes the suicidal ideations as intermittent to the point he almost committed suicide again. He reports suicidal ideations with a plan to overdose on the medications in his backpack or step onto the highway. He reports intent to act on suicidal thoughts yesterday and states that he was almost at the point yesterday afternoon when he was feeling overloaded with stress and his friend was running his mouth. He reports 2 past suicide attempts, last year around May he tried to overdose and when he was 23 years old he tried to hang himself. He denies self injurious behaviors. He denies homicidal ideations. However, he reports that he has warned to his mother's boyfriend that he is protective about her because he (boyfriend) is always making his mom cry. He identifies current stressors/triggers attributing to his symptoms as feeling overly stressed with trying to prevent his friends from getting into a fight, being on the street, trying pass to get his GED, mother and mother's boyfriend fighting more frequently and causing stress on his mental health. He states that his girlfriend gives him purpose to not attempt suicide.  He reports history of depression that has worsened the past three days. He describes his depressive symptoms as drawing back from people, agitated, wanting to end it all, occasional sadness but is rare, overeating for the past week, feelings of worthlessness and hopelessness, decreased energy and decreased motivation. He denies AVH. There is no objective evidence that the patient is responding to internal or external stimuli. He states that he is prescribed Wellbutrin and Lexapro for his depression and anxiety. He reports that he last took Wellbutrin the day before yesterday. He reports that he stopped Lexapro about a month ago because it makes him feel tired and that he cannot take the medication while he is sleeping on the street because he needs to be alert. He denies outpatient psychiatry and states that his medications are refilled by his PCP. He denies outpatient therapy. He reports 2 past inpatient hospitalizations, [facility] December 2024 and [facility]. He is unemployed. He is homeless. He denies recent legal issues. He does not have children. He reports a medical history of asthma and takes a daily maintenance inhaler that he is unable to recall the name of. He reports to drinking alcohol occasionally, every other day to every 3 days, on average 1-2 beers and reports that he last consumed alcohol this morning. He reports drinking alcohol since age 38. He denies a history of alcohol withdrawal seizures or delirium tremens. He reports a family history of alcoholics. He reports smoking marijuana every 3 days, on average 1-2 blunts and states that he last used last night. He reports smoking marijuana since age 85.    Flowsheet Row  ED from 06/20/2023 in [facility] ED from 05/03/2023 in [facility] Emergency Department at [facility] ED from 03/04/2023 in [facility]  C-SSRS RISK CATEGORY High Risk No Risk High Risk        Psychiatric Specialty Exam  Presentation  General Appearance:Appropriate for Environment  Eye Contact:Fair  Speech:Clear and Coherent  Speech Volume:Normal  Handedness:Right   Mood and Affect  Mood: Depressed  Affect: Congruent   Thought Process  Thought Processes: Coherent  Descriptions of Associations:Intact  Orientation:Full (Time, Place and Person)  Thought Content:Logical    Hallucinations:None  Ideas of Reference:None  Suicidal Thoughts:Yes, Active With Plan With Intent; With Plan  Homicidal Thoughts:No   Sensorium  Memory: Immediate Fair; Recent Fair; Remote Fair  Judgment: Intact  Insight: Present   Executive Functions  Concentration: Fair  Attention Span: Fair  Recall: [employer] of Knowledge: Fair  Language: Fair   Psychomotor Activity  Psychomotor Activity: Normal   Assets  Assets: Manufacturing systems engineer; Desire for Improvement; Physical Health; Leisure Time   Sleep  Sleep: Fair  Number of hours:  8   Physical Exam: Physical Exam Cardiovascular:     Rate and Rhythm: Normal rate.  Pulmonary:     Effort: Pulmonary effort is normal.  Musculoskeletal:        General: Normal range of motion.     Cervical back: Normal range of motion.  Neurological:     Mental Status: He is alert and oriented to person, place, and time.    Review of Systems  Constitutional: Negative.   HENT: Negative.    Eyes: Negative.   Respiratory: Negative.    Cardiovascular: Negative.   Gastrointestinal: Negative.   Genitourinary: Negative.   Musculoskeletal: Negative.   Neurological: Negative.   Endo/Heme/Allergies: Negative.   Psychiatric/Behavioral:  Positive for depression and suicidal ideas.    Blood pressure 116/75, pulse 77, temperature 98.2 F (36.8 C), temperature source Oral, resp. rate 16, SpO2 100%. There is no height or weight on file to calculate BMI.  Musculoskeletal: Strength & Muscle Tone: within normal  limits Gait & Station: normal Patient leans: N/A   [facility] MSE Discharge Disposition for Follow up and Recommendations: Based on my evaluation I certify that psychiatric inpatient services furnished can reasonably be expected to improve the patient's condition which I recommend transfer to an appropriate accepting facility.  Patient accepted to [facility] for inpatient psychiatric treatment. Patient is voluntary.   Patient is recommended for inpatient psychiatric treatment for mood stabilization and medication management for complaints of worsening depression and suicidal ideations.  Patient could benefit from switching Lexapro to another SSRI that is less sedating for the patient due to the patient current living situation and patient being afraid to take medication at night-time.  Lab Orders         CBC with Differential/Platelet         Comprehensive metabolic panel         Hemoglobin A1c         Ethanol         Lipid panel         TSH         RPR         POCT Urine Drug Screen - (I-Screen)    EKG   Daxson Reffett L, NP 06/20/2023, 4:29 PM

## 2023-06-20 NOTE — ED Notes (Signed)
 Accepted to [facility] tonight

## 2023-06-20 NOTE — Discharge Instructions (Signed)
Patient accepted to [facility].

## 2023-06-20 NOTE — ED Notes (Signed)
 Pt is admitted to [facility] due to SI with multiple plans. Pt currently denies and verbally contracts for safety on the unit. Pt was advised to notify staff when having thoughts of hurting self or others. Pt verbalized understanding. Pt is alert and oriented X3. Pt is ambulatory and is oriented to staff/unit. Pt was cooperative with labs and skin assessment. Pt denies current HI/AVH. Staff will monitor for pt's safety.
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
## 2023-06-21 ENCOUNTER — Encounter: Payer: Self-pay | Admitting: Behavioral Health

## 2023-06-21 ENCOUNTER — Inpatient Hospital Stay
Admission: AD | Admit: 2023-06-21 | Discharge: 2023-06-25 | DRG: 885 | Disposition: A | Discharge status: Home / Self Care | Payer: MEDICAID | Source: Intra-hospital | Attending: Psychiatry | Admitting: Psychiatry

## 2023-06-21 ENCOUNTER — Other Ambulatory Visit: Payer: Self-pay

## 2023-06-21 DIAGNOSIS — Z833 Family history of diabetes mellitus: Secondary | ICD-10-CM

## 2023-06-21 DIAGNOSIS — Z560 Unemployment, unspecified: Secondary | ICD-10-CM

## 2023-06-21 DIAGNOSIS — Z5986 Financial insecurity: Secondary | ICD-10-CM | POA: Diagnosis not present

## 2023-06-21 DIAGNOSIS — Z820 Family history of epilepsy and other diseases of the nervous system: Secondary | ICD-10-CM | POA: Diagnosis not present

## 2023-06-21 DIAGNOSIS — K219 Gastro-esophageal reflux disease without esophagitis: Secondary | ICD-10-CM | POA: Diagnosis present

## 2023-06-21 DIAGNOSIS — J45909 Unspecified asthma, uncomplicated: Secondary | ICD-10-CM | POA: Diagnosis present

## 2023-06-21 DIAGNOSIS — Z604 Social exclusion and rejection: Secondary | ICD-10-CM | POA: Diagnosis present

## 2023-06-21 DIAGNOSIS — Z91011 Allergy to milk products: Secondary | ICD-10-CM

## 2023-06-21 DIAGNOSIS — Z555 Less than a high school diploma: Secondary | ICD-10-CM | POA: Diagnosis not present

## 2023-06-21 DIAGNOSIS — R45851 Suicidal ideations: Secondary | ICD-10-CM | POA: Diagnosis present

## 2023-06-21 DIAGNOSIS — Z825 Family history of asthma and other chronic lower respiratory diseases: Secondary | ICD-10-CM | POA: Diagnosis not present

## 2023-06-21 DIAGNOSIS — Z62810 Personal history of physical and sexual abuse in childhood: Secondary | ICD-10-CM

## 2023-06-21 DIAGNOSIS — F332 Major depressive disorder, recurrent severe without psychotic features: Secondary | ICD-10-CM | POA: Diagnosis present

## 2023-06-21 DIAGNOSIS — Z823 Family history of stroke: Secondary | ICD-10-CM

## 2023-06-21 DIAGNOSIS — Z5900 Homelessness unspecified: Secondary | ICD-10-CM

## 2023-06-21 DIAGNOSIS — Z83438 Family history of other disorder of lipoprotein metabolism and other lipidemia: Secondary | ICD-10-CM | POA: Diagnosis not present

## 2023-06-21 DIAGNOSIS — F1210 Cannabis abuse, uncomplicated: Secondary | ICD-10-CM | POA: Diagnosis present

## 2023-06-21 DIAGNOSIS — F17210 Nicotine dependence, cigarettes, uncomplicated: Secondary | ICD-10-CM | POA: Diagnosis present

## 2023-06-21 DIAGNOSIS — Z79899 Other long term (current) drug therapy: Secondary | ICD-10-CM

## 2023-06-21 DIAGNOSIS — G4700 Insomnia, unspecified: Secondary | ICD-10-CM | POA: Diagnosis present

## 2023-06-21 DIAGNOSIS — Z9152 Personal history of nonsuicidal self-harm: Secondary | ICD-10-CM | POA: Diagnosis not present

## 2023-06-21 DIAGNOSIS — Z5982 Transportation insecurity: Secondary | ICD-10-CM

## 2023-06-21 DIAGNOSIS — Z91014 Allergy to mammalian meats: Secondary | ICD-10-CM

## 2023-06-21 DIAGNOSIS — F410 Panic disorder [episodic paroxysmal anxiety] without agoraphobia: Secondary | ICD-10-CM | POA: Diagnosis present

## 2023-06-21 DIAGNOSIS — Z5941 Food insecurity: Secondary | ICD-10-CM | POA: Diagnosis not present

## 2023-06-21 DIAGNOSIS — Z91012 Allergy to eggs: Secondary | ICD-10-CM

## 2023-06-21 DIAGNOSIS — Z8249 Family history of ischemic heart disease and other diseases of the circulatory system: Secondary | ICD-10-CM | POA: Diagnosis not present

## 2023-06-21 LAB — RPR: RPR Ser Ql: NONREACTIVE

## 2023-06-21 LAB — GC/CHLAMYDIA PROBE AMP (~~LOC~~) NOT AT ARMC
Chlamydia: NEGATIVE
Comment: NEGATIVE
Comment: NORMAL
Neisseria Gonorrhea: NEGATIVE

## 2023-06-21 LAB — HEMOGLOBIN A1C
Hgb A1c MFr Bld: 4.7 % — ABNORMAL LOW (ref 4.8–5.6)
Mean Plasma Glucose: 88.19 mg/dL

## 2023-06-21 MED ORDER — DIPHENHYDRAMINE HCL 50 MG/ML IJ SOLN: 50.0000 mg | Freq: Three times a day (TID) | INTRAMUSCULAR | Status: DC | PRN

## 2023-06-21 MED ORDER — BUPROPION HCL ER (XL) 150 MG PO TB24
150.0000 mg | ORAL_TABLET | Freq: Every day | ORAL | Status: DC
  Administered 2023-06-21 – 2023-06-23 (×3): 150 mg via ORAL
  Filled 2023-06-21 (×3): qty 1

## 2023-06-21 MED ORDER — LORAZEPAM 2 MG/ML IJ SOLN: 2.0000 mg | Freq: Three times a day (TID) | INTRAMUSCULAR | Status: DC | PRN

## 2023-06-21 MED ORDER — ALUM & MAG HYDROXIDE-SIMETH 200-200-20 MG/5ML PO SUSP: 30.0000 mL | ORAL | Status: DC | PRN

## 2023-06-21 MED ORDER — THIAMINE MONONITRATE 100 MG PO TABS
100.0000 mg | ORAL_TABLET | Freq: Every day | ORAL | Status: DC
  Administered 2023-06-21 – 2023-06-23 (×3): 100 mg via ORAL
  Filled 2023-06-21 (×5): qty 1

## 2023-06-21 MED ORDER — HALOPERIDOL 5 MG PO TABS: 5.0000 mg | ORAL_TABLET | Freq: Three times a day (TID) | ORAL | Status: DC | PRN

## 2023-06-21 MED ORDER — ACETAMINOPHEN 325 MG PO TABS: 650.0000 mg | ORAL_TABLET | Freq: Four times a day (QID) | ORAL | Status: DC | PRN

## 2023-06-21 MED ORDER — TRAZODONE HCL 50 MG PO TABS
50.0000 mg | ORAL_TABLET | Freq: Every evening | ORAL | Status: DC | PRN
  Administered 2023-06-23 – 2023-06-24 (×2): 50 mg via ORAL
  Filled 2023-06-21 (×2): qty 1

## 2023-06-21 MED ORDER — DIPHENHYDRAMINE HCL 25 MG PO CAPS: 50.0000 mg | ORAL_CAPSULE | Freq: Three times a day (TID) | ORAL | Status: DC | PRN

## 2023-06-21 MED ORDER — HALOPERIDOL LACTATE 5 MG/ML IJ SOLN: 5.0000 mg | Freq: Three times a day (TID) | INTRAMUSCULAR | Status: DC | PRN

## 2023-06-21 MED ORDER — FOLIC ACID 1 MG PO TABS
1.0000 mg | ORAL_TABLET | Freq: Every day | ORAL | Status: DC
  Administered 2023-06-21 – 2023-06-23 (×3): 1 mg via ORAL
  Filled 2023-06-21 (×5): qty 1

## 2023-06-21 MED ORDER — MAGNESIUM HYDROXIDE 400 MG/5ML PO SUSP: 30.0000 mL | Freq: Every day | ORAL | Status: DC | PRN

## 2023-06-21 MED ORDER — HALOPERIDOL LACTATE 5 MG/ML IJ SOLN: 10.0000 mg | Freq: Three times a day (TID) | INTRAMUSCULAR | Status: DC | PRN

## 2023-06-21 MED ORDER — ADULT MULTIVITAMIN W/MINERALS CH
1.0000 | ORAL_TABLET | Freq: Every day | ORAL | Status: DC
  Administered 2023-06-21 – 2023-06-23 (×3): 1 via ORAL
  Filled 2023-06-21 (×5): qty 1

## 2023-06-21 MED ORDER — ALBUTEROL SULFATE HFA 108 (90 BASE) MCG/ACT IN AERS: 2.0000 | INHALATION_SPRAY | RESPIRATORY_TRACT | Status: DC | PRN

## 2023-06-21 MED ORDER — HYDROXYZINE HCL 25 MG PO TABS
25.0000 mg | ORAL_TABLET | Freq: Three times a day (TID) | ORAL | Status: DC | PRN
  Administered 2023-06-23: 25 mg via ORAL
  Filled 2023-06-21: qty 1

## 2023-06-21 MED ORDER — ENSURE ENLIVE PO LIQD
237.0000 mL | Freq: Two times a day (BID) | ORAL | Status: DC
  Administered 2023-06-21 – 2023-06-25 (×6): 237 mL via ORAL

## 2023-06-21 NOTE — Plan of Care (Signed)
 D: Pt alert and oriented. Pt denies experiencing any anxiety/depression at this time. Pt reports experiencing 6/10 Left Knee pain, prn medication offered and refused by pt. Pt denies experiencing any SI/HI, or AVH at this time.   A: Scheduled medications administered to pt, per MD orders. Support and encouragement provided. Frequent verbal contact made. Routine safety checks conducted q15 minutes.   R: No adverse drug reactions noted. Pt verbally contracts for safety at this time. Pt compliant with medications and treatment plan. Pt interacts well with others on the unit. Pt remains safe at this time. Plan of care ongoing.  Pt did not receive 1400 ensure, pt was sleeping and unable to wake. MD notified and aware.  Problem: Education: Goal: Knowledge of [facility] Education information/materials will improve Outcome: Progressing   Problem: Activity: Goal: Interest or engagement in activities will improve Outcome: Progressing

## 2023-06-21 NOTE — Progress Notes (Signed)
 NUTRITION ASSESSMENT  Pt identified as at risk on the Malnutrition Screen Tool  INTERVENTION:  -Continue regular diet -Ensure Enlive po BID, each supplement provides 350 kcal and 20 grams of protein.  -MVI with minerals daily -1 mg folic acid daily -100 mg thiamine daily   NUTRITION DIAGNOSIS: Unintentional weight loss related to sub-optimal intake as evidenced by pt report.   Goal: Pt to meet >/= 90% of their estimated nutrition needs.  Monitor:  PO intake  Assessment:   Pt with a past psychiatric history significant for MDD and GAD who presented to the [facility] voluntarily with complaints of worsening depression and suicidal ideations.   Pt admitted with suicidal ideations.   Noted pt is currently homeless and has history of recent psychiatric hospitalizations. Pt also admits to alcohol and marijuana use.   Pt currently on a regular diet. No meal completion data available to assess. Suspect diet of poor nutritional quality PTA secondary to homeless.   Reviewed wt hx; wt has been stable over the past 6 months.   Medications reviewed.   Labs reviewed. Tox screen positive for marijuana.   23 y.o. male  Height: Ht Readings from Last 1 Encounters:  06/21/23 5\' 10"  (1.778 m)    Weight: Wt Readings from Last 1 Encounters:  06/21/23 72.1 kg    Weight Hx: Wt Readings from Last 10 Encounters:  06/21/23 72.1 kg  05/07/23 73 kg  05/03/23 68 kg  02/12/23 68 kg  01/10/23 72.9 kg  12/30/22 70.8 kg  12/17/22 72.1 kg  11/20/22 73.5 kg  10/30/22 76.6 kg  10/09/22 75.3 kg    BMI:  Body mass index is 22.81 kg/m. BMI WDL.   Estimated Nutritional Needs: Kcal: 25-30 kcal/kg Protein: > 1 gram protein/kg Fluid: 1 ml/kcal  Diet Order:  Diet Order             Diet regular Room service appropriate? Yes; Fluid consistency: Thin  Diet effective now                  Pt is also offered choice of unit snacks mid-morning and  mid-afternoon.  Pt is eating as desired.   Lab results and medications reviewed.   Levada Schilling, RD, LDN, CDCES Registered Dietitian III Certified Diabetes Care and Education Specialist If unable to reach this RD, please use "RD Inpatient" group chat on secure chat between hours of 8am-4 pm daily
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
## 2023-06-21 NOTE — BH IP Treatment Plan (Signed)
 Interdisciplinary Treatment and Diagnostic Plan Update  06/21/2023 Time of Session: 11:11 AM Roark Rufo MRN: [id number]  Principal Diagnosis: MDD (major depressive disorder), recurrent severe, without psychosis (HCC)  Secondary Diagnoses: Principal Problem:   MDD (major depressive disorder), recurrent severe, without psychosis (HCC)   Current Medications:  Current Facility-Administered Medications  Medication Dose Route Frequency Provider Last Rate Last Admin   acetaminophen (TYLENOL) tablet 650 mg  650 mg Oral Q6H PRN White, Patrice L, NP       albuterol (VENTOLIN HFA) 108 (90 Base) MCG/ACT inhaler 2 puff  2 puff Inhalation Q4H PRN White, Patrice L, NP       alum & mag hydroxide-simeth (MAALOX/MYLANTA) 200-200-20 MG/5ML suspension 30 mL  30 mL Oral Q4H PRN White, Patrice L, NP       buPROPion (WELLBUTRIN XL) 24 hr tablet 150 mg  150 mg Oral Daily White, Patrice L, NP   150 mg at 06/21/23 1308   haloperidol (HALDOL) tablet 5 mg  5 mg Oral TID PRN White, Patrice L, NP       And   diphenhydrAMINE (BENADRYL) capsule 50 mg  50 mg Oral TID PRN White, Patrice L, NP       haloperidol lactate (HALDOL) injection 5 mg  5 mg Intramuscular TID PRN White, Patrice L, NP       And   diphenhydrAMINE (BENADRYL) injection 50 mg  50 mg Intramuscular TID PRN White, Patrice L, NP       And   LORazepam (ATIVAN) injection 2 mg  2 mg Intramuscular TID PRN White, Patrice L, NP       haloperidol lactate (HALDOL) injection 10 mg  10 mg Intramuscular TID PRN White, Patrice L, NP       And   diphenhydrAMINE (BENADRYL) injection 50 mg  50 mg Intramuscular TID PRN White, Patrice L, NP       And   LORazepam (ATIVAN) injection 2 mg  2 mg Intramuscular TID PRN White, Patrice L, NP       feeding supplement (ENSURE ENLIVE / ENSURE PLUS) liquid 237 mL  237 mL Oral BID BM Verner Chol, MD   237 mL at 06/21/23 1204   folic acid (FOLVITE) tablet 1 mg  1 mg Oral Daily Verner Chol, MD   1 mg at 06/21/23 1206    hydrOXYzine (ATARAX) tablet 25 mg  25 mg Oral TID PRN White, Patrice L, NP       magnesium hydroxide (MILK OF MAGNESIA) suspension 30 mL  30 mL Oral Daily PRN White, Patrice L, NP       multivitamin with minerals tablet 1 tablet  1 tablet Oral Daily Verner Chol, MD   1 tablet at 06/21/23 1206   thiamine (VITAMIN B1) tablet 100 mg  100 mg Oral Daily Verner Chol, MD   100 mg at 06/21/23 1206   traZODone (DESYREL) tablet 50 mg  50 mg Oral QHS PRN White, Patrice L, NP       PTA Medications: Medications Prior to Admission  Medication Sig Dispense Refill Last Dose/Taking   albuterol (VENTOLIN HFA) 108 (90 Base) MCG/ACT inhaler Inhale 2 puffs into the lungs every 4 (four) hours as needed for wheezing or shortness of breath.      buPROPion (WELLBUTRIN XL) 150 MG 24 hr tablet Take 1 tablet (150 mg total) by mouth daily.       Patient Stressors:    Patient Strengths:    Treatment Modalities: Medication Management, Group therapy, Case  management,  1 to 1 session with clinician, Psychoeducation, Recreational therapy.   Physician Treatment Plan for Primary Diagnosis: MDD (major depressive disorder), recurrent severe, without psychosis (HCC) Long Term Goal(s):     Short Term Goals:    Medication Management: Evaluate patient's response, side effects, and tolerance of medication regimen.  Therapeutic Interventions: 1 to 1 sessions, Unit Group sessions and Medication administration.  Evaluation of Outcomes: Not Met  Physician Treatment Plan for Secondary Diagnosis: Principal Problem:   MDD (major depressive disorder), recurrent severe, without psychosis (HCC)  Long Term Goal(s):     Short Term Goals:       Medication Management: Evaluate patient's response, side effects, and tolerance of medication regimen.  Therapeutic Interventions: 1 to 1 sessions, Unit Group sessions and Medication administration.  Evaluation of Outcomes: Not Met   RN Treatment Plan for Primary Diagnosis: MDD  (major depressive disorder), recurrent severe, without psychosis (HCC) Long Term Goal(s): Knowledge of disease and therapeutic regimen to maintain health will improve  Short Term Goals: Ability to verbalize frustration and anger appropriately will improve, Ability to demonstrate self-control, Ability to participate in decision making will improve, Ability to verbalize feelings will improve, Ability to disclose and discuss suicidal ideas, and Ability to identify and develop effective coping behaviors will improve  Medication Management: RN will administer medications as ordered by provider, will assess and evaluate patient's response and provide education to patient for prescribed medication. RN will report any adverse and/or side effects to prescribing provider.  Therapeutic Interventions: 1 on 1 counseling sessions, Psychoeducation, Medication administration, Evaluate responses to treatment, Monitor vital signs and CBGs as ordered, Perform/monitor CIWA, COWS, AIMS and Fall Risk screenings as ordered, Perform wound care treatments as ordered.  Evaluation of Outcomes: Not Met   LCSW Treatment Plan for Primary Diagnosis: MDD (major depressive disorder), recurrent severe, without psychosis (HCC) Long Term Goal(s): Safe transition to appropriate next level of care at discharge, Engage patient in therapeutic group addressing interpersonal concerns.  Short Term Goals: Engage patient in aftercare planning with referrals and resources, Increase social support, Increase ability to appropriately verbalize feelings, Increase emotional regulation, Facilitate acceptance of mental health diagnosis and concerns, Facilitate patient progression through stages of change regarding substance use diagnoses and concerns, Identify triggers associated with mental health/substance abuse issues, and Increase skills for wellness and recovery  Therapeutic Interventions: Assess for all discharge needs, 1 to 1 time with Social  worker, Explore available resources and support systems, Assess for adequacy in community support network, Educate family and significant other(s) on suicide prevention, Complete Psychosocial Assessment, Interpersonal group therapy.  Evaluation of Outcomes: Not Met   Progress in Treatment: Attending groups: Yes. Participating in groups: Yes. Taking medication as prescribed: Yes. Toleration medication: Yes. Family/Significant other contact made: No, will contact:  [name] to contact once permission is granted.  Patient understands diagnosis: Yes. Discussing patient identified problems/goals with staff: Yes. Medical problems stabilized or resolved: Yes. Denies suicidal/homicidal ideation: Yes. Issues/concerns per patient self-inventory: No. Other: None  New problem(s) identified: No, Describe:  None  New Short Term/Long Term Goal(s):detox, elimination of symptoms of psychosis, medication management for mood stabilization; elimination of SI thoughts; development of comprehensive mental wellness/sobriety plan.    Patient Goals:  "Try to get my meds under control."  Discharge Plan or Barriers: [name] to assist in the development of appropriate discharge plan.   Reason for Continuation of Hospitalization: Anxiety Delusions  Depression Medication stabilization Suicidal ideation  Estimated Length of Stay: 1-7 days.   Last  3 [place] Suicide Severity Risk Score: Flowsheet Row Admission (Current) from 06/21/2023 in [facility] ED from 06/20/2023 in [facility] ED from 05/03/2023 in [facility]  C-SSRS RISK CATEGORY High Risk High Risk No Risk       Last [facility] 2/9 Scores:    12/30/2022    4:03 PM 11/20/2022   11:11 AM 10/09/2022    3:26 PM  Depression screen PHQ 2/9  Decreased Interest 1 1 2   Down, Depressed, Hopeless 1 2 2   PHQ - 2 Score 2 3 4   Altered sleeping 0 1 1  Tired, decreased energy 1 2  2   Change in appetite 0 2 3  Feeling bad or failure about yourself  1 1 3   Trouble concentrating 0 1 2  Moving slowly or fidgety/restless 0 1 1  Suicidal thoughts 0 0 0  PHQ-9 Score 4 11 16   Difficult doing work/chores Somewhat difficult Somewhat difficult Somewhat difficult    Scribe for Treatment Team: Lowry Ram, [place] 06/21/2023 2:49 PM

## 2023-06-21 NOTE — Group Note (Signed)
 Date:  06/21/2023 Time:  10:15 AM  Group Topic/Focus:  Goals Group:   The focus of this group is to help patients establish daily goals to achieve during treatment and discuss how the patient can incorporate goal setting into their daily lives to aide in recovery.    Participation Level:  Active  Participation Quality:  Appropriate  Affect:  Appropriate  Cognitive:  Appropriate  Insight: Appropriate  Engagement in Group:  Engaged  Modes of Intervention:  Discussion and Education  Additional Comments:    Wilford Corner 06/21/2023, 10:15 AM

## 2023-06-21 NOTE — Progress Notes (Signed)
   06/21/23 0900  Spiritual Encounters  Type of Visit Initial  Care provided to: Patient  Conversation partners present during encounter Nurse  Referral source Patient request  Reason for visit Routine spiritual support  OnCall Visit Yes  Spiritual Framework  Presenting Themes Meaning/purpose/sources of inspiration;Goals in life/care;Rituals and practive;Courage hope and growth;Impactful experiences and emotions   Chaplain received a spiritual consult to bring patient a bible. Patient was grateful for the Bible. Chaplain went with patient outside to the courtyard with other patients and staff. Chaplain will continue to follow up with patient.
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
## 2023-06-21 NOTE — Group Note (Signed)
 Date:  06/21/2023 Time:  10:10 PM  Group Topic/Focus:  Wrap-Up Group:   The focus of this group is to help patients review their daily goal of treatment and discuss progress on daily workbooks.    Participation Level:  Active  Participation Quality:  Appropriate  Affect:  Appropriate  Cognitive:  Appropriate  Insight: Appropriate  Engagement in Group:  Engaged  Modes of Intervention:  Discussion   Lenore Cordia 06/21/2023, 10:10 PM

## 2023-06-21 NOTE — Plan of Care (Signed)
   Problem: Education: Goal: Knowledge of [place] [facility] Education information/materials will improve Outcome: Progressing   Problem: Education: Goal: Verbalization of understanding the information provided will improve Outcome: Progressing

## 2023-06-21 NOTE — Group Note (Signed)
 Recreation Therapy Group Note   Group Topic:Leisure Education  Group Date: 06/21/2023 Start Time: 1000 End Time: 1100 Facilitators: Rosina Lowenstein, LRT, CTRS Location:  Craft Room  Group Description: Leisure. Patients were given the option to choose from singing karaoke, coloring mandalas, using oil pastels, journaling, or playing with play-doh. LRT and pts discussed the meaning of leisure, the importance of participating in leisure during their free time/when they're outside of the hospital, as well as how our leisure interests can also serve as coping skills.  Goal Area(s) Addressed:  Patient will identify a current leisure interest.  Patient will learn the definition of "leisure". Patient will practice making a positive decision. Patient will have the opportunity to try a new leisure activity. Patient will communicate with peers and LRT.    Affect/Mood: Appropriate   Participation Level: Minimal    Clinical Observations/Individualized Feedback: Christopher Forbes came late to group. Pt was present for less than half of session.   Plan: Continue to engage patient in RT group sessions 2-3x/week.   Rosina Lowenstein, LRT, CTRS 06/21/2023 1:21 PM

## 2023-06-22 NOTE — Group Note (Deleted)
 Date:  06/22/2023 Time:  9:53 PM  Group Topic/Focus:  Wrap-Up Group:   The focus of this group is to help patients review their daily goal of treatment and discuss progress on daily workbooks.     Participation Level:  {BHH PARTICIPATION WUJWJ:19147}  Participation Quality:  {BHH PARTICIPATION QUALITY:22265}  Affect:  {BHH AFFECT:22266}  Cognitive:  {BHH COGNITIVE:22267}  Insight: {BHH Insight2:20797}  Engagement in Group:  {BHH ENGAGEMENT IN WGNFA:21308}  Modes of Intervention:  {BHH MODES OF INTERVENTION:22269}  Additional Comments:  ***  Maglione,Angell Honse E 06/22/2023, 9:53 PM

## 2023-06-22 NOTE — BHH Suicide Risk Assessment (Signed)
 BHH INPATIENT:  Family/Significant Other Suicide Prevention Education  Suicide Prevention Education:  Education Completed; Daylene Posey, [phone], Mother, has been identified by the patient as the family member/significant other with whom the patient will be residing, and identified as the person(s) who will aid the patient in the event of a mental health crisis (suicidal ideations/suicide attempt).  With written consent from the patient, the family member/significant other has been provided the following suicide prevention education, prior to the and/or following the discharge of the patient.  The suicide prevention education provided includes the following: Suicide risk factors Suicide prevention and interventions National Suicide Hotline telephone number [facility] assessment telephone number [facility] Emergency Assistance 911 [facility] and/or Residential Mobile Crisis Unit telephone number  Request made of family/significant other to: Remove weapons (e.g., guns, rifles, knives), all items previously/currently identified as safety concern.   Remove drugs/medications (over-the-counter, prescriptions, illicit drugs), all items previously/currently identified as a safety concern.  The family member/significant other verbalizes understanding of the suicide prevention education information provided.  The family member/significant other agrees to remove the items of safety concern listed above.  The LCSWA contacted the patient mother to provide SPI. The patient mother stated that the patient does not have access to any gins. Stated that after discharge he would not have anywhere to go. Mom stated that she was concerned about the patient sever mood swings. Stating that he could be fine one moment the next moment he is lashing out on people. Mom provided family mental health history, brother bipolar, dad schizophrenia, and mom depression and anxiety.     Marshell Levan 06/22/2023, 2:52 PM

## 2023-06-22 NOTE — Plan of Care (Signed)
  Problem: Education: Goal: Knowledge of [facility] Education information/materials will improve Outcome: Progressing   Problem: Activity: Goal: Interest or engagement in activities will improve Outcome: Progressing   Problem: Coping: Goal: Ability to verbalize frustrations and anger appropriately will improve Outcome: Progressing   Problem: Education: Goal: Knowledge of [facility] Education information/materials will improve Outcome: Progressing   Problem: Activity: Goal: Interest or engagement in activities will improve Outcome: Progressing   Problem: Coping: Goal: Ability to verbalize frustrations and anger appropriately will improve Outcome: Progressing

## 2023-06-22 NOTE — Progress Notes (Signed)
   06/22/23 0100  Psych Admission Type (Psych Patients Only)  Admission Status Voluntary  Psychosocial Assessment  Patient Complaints None  Eye Contact Fair  Facial Expression Flat  Affect Appropriate to circumstance  Speech Logical/coherent  Interaction Assertive  Motor Activity Slow  Appearance/Hygiene Unremarkable  Behavior Characteristics Cooperative;Appropriate to situation  Mood Depressed  Thought Process  Coherency WDL  Content WDL  Delusions None reported or observed  Perception WDL  Hallucination None reported or observed  Judgment Poor  Confusion None  Danger to Self  Current suicidal ideation? Denies  Description of Suicide Plan none  Agreement Not to Harm Self Yes  Description of Agreement verbal  Danger to Others  Danger to Others None reported or observed

## 2023-06-22 NOTE — Group Note (Signed)
 LCSW Group Therapy Note   Group Date: 06/22/2023 Start Time: 1400 End Time: 1500   Type of Therapy and Topic:  Group Therapy: AA/NA Group  Participation Level:  Active  Description of Group: AA/NA Group  Summary of Patient Progress:    Patient attended group.   Harden Mo, LCSW 06/22/2023  3:15 PM
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
## 2023-06-22 NOTE — Progress Notes (Signed)
   06/22/23 1200  Psych Admission Type (Psych Patients Only)  Admission Status Voluntary  Psychosocial Assessment  Patient Complaints None  Eye Contact Fair  Facial Expression Animated  Affect Appropriate to circumstance  Speech Logical/coherent  Interaction Assertive  Motor Activity Slow  Appearance/Hygiene Unremarkable  Behavior Characteristics Cooperative  Mood Depressed  Thought Process  Coherency WDL  Content WDL  Delusions None reported or observed  Perception WDL  Hallucination None reported or observed  Judgment Limited  Confusion None  Danger to Self  Current suicidal ideation? Denies  Danger to Others  Danger to Others None reported or observed

## 2023-06-22 NOTE — Group Note (Signed)
 Date:  06/22/2023 Time:  10:02 PM  Group Topic/Focus:  Wrap-Up Group:   The focus of this group is to help patients review their daily goal of treatment and discuss progress on daily workbooks.    Participation Level:  Did Not Attend   Maglione,Maxum Cassarino E 06/22/2023, 10:02 PM

## 2023-06-22 NOTE — Plan of Care (Signed)
   Problem: Education: Goal: Emotional status will improve Outcome: Progressing   Problem: Education: Goal: Mental status will improve Outcome: Progressing   Problem: Activity: Goal: Interest or engagement in activities will improve Outcome: Progressing

## 2023-06-23 MED ORDER — BUPROPION HCL ER (XL) 150 MG PO TB24
300.0000 mg | ORAL_TABLET | Freq: Every day | ORAL | Status: DC
  Administered 2023-06-24 – 2023-06-25 (×2): 300 mg via ORAL
  Filled 2023-06-23 (×2): qty 2

## 2023-06-23 NOTE — Progress Notes (Signed)
   06/22/23 2000  Psych Admission Type (Psych Patients Only)  Admission Status Voluntary  Psychosocial Assessment  Patient Complaints None  Eye Contact Fair  Facial Expression Animated  Affect Appropriate to circumstance  Speech Logical/coherent  Interaction Assertive  Motor Activity Slow  Appearance/Hygiene Unremarkable  Behavior Characteristics Cooperative;Appropriate to situation  Mood Depressed  Thought Process  Coherency WDL  Content WDL  Delusions WDL  Perception WDL  Hallucination None reported or observed  Judgment Limited  Confusion None  Danger to Self  Current suicidal ideation? Denies   Patient is alert and oriented x 4, he denies SI/HI/AVH no distress noted, 15 minutes safety checks maintained.

## 2023-06-23 NOTE — Group Note (Signed)
 Date:  06/23/2023 Time:  5:40 PM  Group Topic/Focus:  Activity Group: The focus of the group is to promote activity for the patients and encourage them to go outside in the courtyard and get some fresh air and some exercise.    Participation Level:  Active  Participation Quality:  Appropriate  Affect:  Appropriate  Cognitive:  Appropriate  Insight: Appropriate  Engagement in Group:  Engaged  Modes of Intervention:  Activity  Additional Comments:    Mary Sella Livy Ross 06/23/2023, 5:40 PM

## 2023-06-23 NOTE — Plan of Care (Signed)
   Problem: Education: Goal: Emotional status will improve Outcome: Progressing   Problem: Education: Goal: Mental status will improve Outcome: Progressing

## 2023-06-23 NOTE — Progress Notes (Signed)
 [facility] MD Progress Note  06/23/2023 10:27 PM Nicolas Hartwell Vandiver  MRN:  [id number]    Albie Arizpe is a 23 y.o. male patient with a past psychiatric history significant for MDD and GAD who presented to the [facility] voluntarily with complaints of worsening depression and suicidal ideations.  Patient is admitted for inpatient hospitalization for further stabilization.   Subjective: Patient's case discussed in multidisciplinary team, all notes and vitals reviewed.  No behavioral issues reported overnight.  On assessment he is calm and cooperative, alert and oriented x 4.He continues to endorse suicidal ideations and significant depression, he states that he came to the hospital to get his medications fixed, states he was suicidal with the plan to walk into traffic, states he has been feeling this way since December.He also endorsed his moments of lability and anger outburst throughout the day. Denies Any history of mania. Time he has no side effects noted and no other concerns.Reports that his medication compliant, continue to monitor closely.  Principal Problem: MDD (major depressive disorder), recurrent severe, without psychosis (HCC) Diagnosis: Principal Problem:   MDD (major depressive disorder), recurrent severe, without psychosis (HCC)  Total Time spent with patient: 45 minutes  Past Psychiatric History:  Prev Dx/Sx:depression, trauma Current Psych Provider: None available Home Meds (current): Wellbutrin XL 150 mg Previous Med Trials: None reported Therapy: None reported Prior Psych Hospitalization: Few years ago Prior Self Harm: May 2024 1 suicide attempt, a suicide attempt was at age 34 Prior Violence: None reported  Past Medical History:  Past Medical History:  Diagnosis Date   Acne    Allergy    Anxiety    Asthma    Chronic headaches    Depression    GERD (gastroesophageal reflux disease)     Past Surgical History:  Procedure Laterality  Date   OPEN REDUCTION INTERNAL FIXATION (ORIF) METACARPAL Right 11/19/2018   Procedure: Right small finger metacarpal open malunion repair;  Surgeon: Bradly Bienenstock, MD;  Location: [facility];  Service: Orthopedics;  Laterality: Right;  90 minutes   TYMPANOSTOMY TUBE PLACEMENT Bilateral    placed age 63 have fallen out   UPPER GASTROINTESTINAL ENDOSCOPY     WISDOM TOOTH EXTRACTION  04/04/2021   Family History:  Family History  Problem Relation Age of Onset   Migraines Mother    Diabetes Father    Hyperlipidemia Father    Hypertension Father    COPD Father    Asthma Father    Migraines Brother    Stroke Maternal Grandmother    Colon cancer Neg Hx    Esophageal cancer Neg Hx    Rectal cancer Neg Hx    Stomach cancer Neg Hx    Family Psychiatric  History:  Family Psych History: Unknown Family Hx suicide: Unknown Social History:  Social History   Substance and Sexual Activity  Alcohol Use No   Alcohol/week: 0.0 standard drinks of alcohol     Social History   Substance and Sexual Activity  Drug Use No    Social History   Socioeconomic History   Marital status: Single    Spouse name: Not on file   Number of children: Not on file   Years of education: Not on file   Highest education level: 11th grade  Occupational History   Occupation: student  Tobacco Use   Smoking status: Some Days    Current packs/day: 0.25    Types: Cigarettes   Smokeless tobacco: Never  Tobacco comments:    1 -2 cigs a day for the past two months   Vaping Use   Vaping status: Former  Substance and Sexual Activity   Alcohol use: No    Alcohol/week: 0.0 standard drinks of alcohol   Drug use: No   Sexual activity: Yes    Partners: Male  Other Topics Concern   Not on file  Social History Narrative   Patient lives with brother step sister and step dad Transport planner. Mom is involved. Right handed      06/21/2023 Pt states "I live on the [address], I don't live with my step dad"    Social Drivers of Health   Financial Resource Strain: High Risk (02/25/2023)   Overall Financial Resource Strain (CARDIA)    Difficulty of Paying Living Expenses: Very hard  Food Insecurity: Food Insecurity Present (06/21/2023)   Hunger Vital Sign    Worried About Running Out of Food in the Last Year: Sometimes true    Ran Out of Food in the Last Year: Sometimes true  Transportation Needs: Unmet Transportation Needs (06/21/2023)   PRAPARE - Administrator, Civil Service (Medical): Yes    Lack of Transportation (Non-Medical): Yes  Physical Activity: Insufficiently Active (02/25/2023)   Exercise Vital Sign    Days of Exercise per Week: 3 days    Minutes of Exercise per Session: 30 min  Stress: Stress Concern Present (02/25/2023)   [employer] of Occupational Health - Occupational Stress Questionnaire    Feeling of Stress : To some extent  Social Connections: Socially Isolated (02/25/2023)   Social Connection and Isolation Panel [NHANES]    Frequency of Communication with Friends and Family: Twice a week    Frequency of Social Gatherings with Friends and Family: Once a week    Attends Religious Services: Never    Database administrator or Organizations: No    Attends Engineer, structural: Not on file    Marital Status: Separated   Additional Social History:                         Sleep: Good  Appetite:  Good  Current Medications: Current Facility-Administered Medications  Medication Dose Route Frequency Provider Last Rate Last Admin   acetaminophen (TYLENOL) tablet 650 mg  650 mg Oral Q6H PRN White, Patrice L, NP       albuterol (VENTOLIN HFA) 108 (90 Base) MCG/ACT inhaler 2 puff  2 puff Inhalation Q4H PRN White, Patrice L, NP       alum & mag hydroxide-simeth (MAALOX/MYLANTA) 200-200-20 MG/5ML suspension 30 mL  30 mL Oral Q4H PRN White, Patrice L, NP       [START ON 06/24/2023] buPROPion (WELLBUTRIN XL) 24 hr tablet 300 mg  300 mg Oral Daily  Naba Sneed, MD       haloperidol (HALDOL) tablet 5 mg  5 mg Oral TID PRN White, Patrice L, NP       And   diphenhydrAMINE (BENADRYL) capsule 50 mg  50 mg Oral TID PRN White, Patrice L, NP       haloperidol lactate (HALDOL) injection 5 mg  5 mg Intramuscular TID PRN White, Patrice L, NP       And   diphenhydrAMINE (BENADRYL) injection 50 mg  50 mg Intramuscular TID PRN White, Patrice L, NP       And   LORazepam (ATIVAN) injection 2 mg  2 mg Intramuscular TID PRN Liborio Nixon  L, NP       haloperidol lactate (HALDOL) injection 10 mg  10 mg Intramuscular TID PRN White, Patrice L, NP       And   diphenhydrAMINE (BENADRYL) injection 50 mg  50 mg Intramuscular TID PRN White, Patrice L, NP       And   LORazepam (ATIVAN) injection 2 mg  2 mg Intramuscular TID PRN White, Patrice L, NP       feeding supplement (ENSURE ENLIVE / ENSURE PLUS) liquid 237 mL  237 mL Oral BID BM Verner Chol, MD   237 mL at 06/23/23 1500   folic acid (FOLVITE) tablet 1 mg  1 mg Oral Daily Verner Chol, MD   1 mg at 06/23/23 0948   hydrOXYzine (ATARAX) tablet 25 mg  25 mg Oral TID PRN Liborio Nixon L, NP   25 mg at 06/23/23 2133   magnesium hydroxide (MILK OF MAGNESIA) suspension 30 mL  30 mL Oral Daily PRN White, Patrice L, NP       multivitamin with minerals tablet 1 tablet  1 tablet Oral Daily Verner Chol, MD   1 tablet at 06/23/23 0948   thiamine (VITAMIN B1) tablet 100 mg  100 mg Oral Daily Verner Chol, MD   100 mg at 06/23/23 0948   traZODone (DESYREL) tablet 50 mg  50 mg Oral QHS PRN White, Patrice L, NP   50 mg at 06/23/23 2133    Lab Results:  No results found for this or any previous visit (from the past 48 hours).   Blood Alcohol level:  Lab Results  Component Value Date   ETH <10 06/20/2023   ETH <10 09/02/2022    Metabolic Disorder Labs: Lab Results  Component Value Date   HGBA1C 4.7 (L) 06/20/2023   MPG 88.19 06/20/2023   MPG 93.93 03/04/2023   No results found for:  "PROLACTIN" Lab Results  Component Value Date   CHOL 231 (H) 06/20/2023   TRIG 221 (H) 06/20/2023   HDL 46 06/20/2023   CHOLHDL 5.0 06/20/2023   VLDL 44 (H) 06/20/2023   LDLCALC 141 (H) 06/20/2023    Physical Findings: AIMS:  , ,  ,  ,    CIWA:    COWS:     Musculoskeletal: Strength & Muscle Tone: within normal limits Gait & Station: normal Patient leans: N/A  Psychiatric Specialty Exam:  Presentation  General Appearance:  Appropriate for Environment  Eye Contact: Fair  Speech: Clear and Coherent  Speech Volume: Normal  Handedness: Right   Mood and Affect  Mood: Anxious; Depressed  Affect: Congruent   Thought Process  Thought Processes: Coherent  Descriptions of Associations:Intact  Orientation:Full (Time, Place and Person)  Thought Content:Logical  History of Schizophrenia/Schizoaffective disorder:No data recorded Duration of Psychotic Symptoms:No data recorded Hallucinations:Hallucinations: None   Ideas of Reference:None  Suicidal Thoughts:denies   Homicidal Thoughts:Homicidal Thoughts: No    Sensorium  Memory: Immediate Fair; Remote Fair; Recent Fair  Judgment: Fair   Insight: Fair    Art therapist  Concentration: Fair  Attention Span: Fair  Recall: [employer] of Knowledge: Fair  Language: Fair   Psychomotor Activity  Psychomotor Activity: Psychomotor Activity: Normal    Assets  Assets: Communication Skills; Desire for Improvement; Physical Health   Sleep  Sleep:Sleep: Fair    Physical Exam: Physical Exam Vitals and nursing note reviewed.  Constitutional:      Appearance: Normal appearance.  HENT:     Head: Normocephalic and atraumatic.  Eyes:  Extraocular Movements: Extraocular movements intact.  Pulmonary:     Effort: Pulmonary effort is normal.  Musculoskeletal:        General: Normal range of motion.  Skin:    General: Skin is dry.  Neurological:     Mental Status: He is  alert and oriented to person, place, and time. Mental status is at baseline.  Psychiatric:        Attention and Perception: Attention and perception normal.        Mood and Affect: Mood is anxious and depressed. Affect is blunt.        Speech: Speech normal.        Behavior: Behavior is cooperative.        Thought Content: Thought content does not include suicidal ideation.        Cognition and Memory: Cognition and memory normal.        Judgment: Judgment is impulsive.    Review of Systems  Psychiatric/Behavioral:  Positive for depression, substance abuse and suicidal ideas. The patient is nervous/anxious.   All other systems reviewed and are negative.  Blood pressure 130/82, pulse 94, temperature 98.4 F (36.9 C), resp. rate 15, height 5\' 10"  (1.778 m), weight 72.1 kg, SpO2 100%. Body mass index is 22.81 kg/m.   Treatment Plan Summary: 06/23/2023: Patient continues to endorse significant depression with suicidal ideations, without plan.  Will increase Wellbutrin to 300mg  daily      Safety and Monitoring:             -- Voluntary admission to inpatient psychiatric unit for safety, stabilization and treatment             -- Daily contact with patient to assess and evaluate symptoms and progress in treatment             -- Patient's case to be discussed in multi-disciplinary team meeting             -- Observation Level: q15 minute checks             -- Vital signs:  q12 hours             -- Precautions: suicide, elopement, and assault   2. Psychiatric Diagnoses and Treatment:                Increase Wellbutrin XL 300mg   daily Continue Trazodone 50 mg HS PRN    -- The risks/benefits/side-effects/alternatives to this medication were discussed in detail with the patient and time was given for questions. The patient consents to medication trial.                -- Metabolic profile and EKG monitoring obtained while on an atypical antipsychotic (BMI:22.81 Lipid Panel: abnormal  HbgA1c:4.7 QTc:420)              -- Encouraged patient to participate in unit milieu and in scheduled group therapies                            3. Medical Issues Being Addressed:      Asthma  -- Continue Ventolin HFA 108 MCG/ACT   4. Discharge Planning:              -- Social work and case management to assist with discharge planning and identification of hospital follow-up needs prior to discharge             -- Estimated LOS: 5-7 days             --  Discharge Concerns: Need to establish a safety plan; Medication compliance and effectiveness             -- Discharge Goals: Return home with outpatient referrals follow ups   Physician Treatment Plan for Primary Diagnosis: MDD (major depressive disorder), recurrent severe, without psychosis (HCC) Long Term Goal(s): Improvement in symptoms so as ready for discharge   Short Term Goals: Ability to identify changes in lifestyle to reduce recurrence of condition will improve, Ability to verbalize feelings will improve, Ability to disclose and discuss suicidal ideas, Ability to demonstrate self-control will improve, and Ability to identify and develop effective coping behaviors will improve   Physician Treatment Plan for Secondary Diagnosis: Principal Problem:   MDD (major depressive disorder), recurrent severe, without psychosis (HCC)   Long Term Goal(s): Improvement in symptoms so as ready for discharge   Short Term Goals: Ability to identify changes in lifestyle to reduce recurrence of condition will improve, Ability to verbalize feelings will improve, Ability to disclose and discuss suicidal ideas, Ability to demonstrate self-control will improve, Ability to identify and develop effective coping behaviors will improve, and Ability to maintain clinical measurements within normal limits will improve  Sanjuana Mae, MD 06/23/2023, 10:27 PM

## 2023-06-23 NOTE — Group Note (Signed)
 Date:  06/23/2023 Time:  10:59 PM  Group Topic/Focus:  Wrap-Up Group:   The focus of this group is to help patients review their daily goal of treatment and discuss progress on daily workbooks.    Participation Level:  Active  Participation Quality:  Appropriate and Attentive  Affect:  Flat  Cognitive:  Alert and Appropriate  Insight: Appropriate  Engagement in Group:  Limited and Resistant  Modes of Intervention:  Discussion, Orientation, and Support  Additional Comments:     Maglione,Bharath Bernstein E 06/23/2023, 10:59 PM

## 2023-06-24 NOTE — Progress Notes (Signed)
 [facility] MD Progress Note  06/24/2023 8:59 AM Christopher Forbes  MRN:  [id number]    Christopher Forbes is a 23 y.o. male patient with a past psychiatric history significant for MDD and GAD who presented to the [facility] voluntarily with complaints of worsening depression and suicidal ideations.  Patient is admitted for inpatient hospitalization for further stabilization.   Subjective: Patient's case discussed in multidisciplinary team, all notes and vitals reviewed.  No behavioral issues reported overnight.  Patient is seen for reassessment, approached while in group session.  States that he is doing okay, continues to endorse depressed mood and anxiety which she rates as a 5 out of 10.  He is expressing dissatisfaction related to current medication regimen, states that he wants to start an additional SSRI in addition to Wellbutrin, though he seems to be improving on increase dose of Wellbutrin.  He is denying SI/HI and AVH today.  Reports good sleep with as needed trazodone, fair appetite.  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  His plan is to go stay with his mother and her boyfriend for a few days until the 11th when he will be able to move in with his friend who is a security guard and stay with send friend.  He is med compliant, observed out and about on the milieu multiple times throughout the day, interacting appropriately with others.  Social work team currently attempting to reach out to the patient's mother to confirm that he is allowed to be discharged there, team will continue to work on safe disposition.    Principal Problem: MDD (major depressive disorder), recurrent severe, without psychosis (HCC) Diagnosis: Principal Problem:   MDD (major depressive disorder), recurrent severe, without psychosis (HCC)  Total Time spent with patient: 45 minutes  Past Psychiatric History:  Prev Dx/Sx:depression,  trauma Current Psych Provider: None available Home Meds (current): Wellbutrin XL 150 mg Previous Med Trials: None reported Therapy: None reported Prior Psych Hospitalization: Few years ago Prior Self Harm: May 2024 1 suicide attempt, a suicide attempt was at age 42 Prior Violence: None reported  Past Medical History:  Past Medical History:  Diagnosis Date   Acne    Allergy    Anxiety    Asthma    Chronic headaches    Depression    GERD (gastroesophageal reflux disease)     Past Surgical History:  Procedure Laterality Date   OPEN REDUCTION INTERNAL FIXATION (ORIF) METACARPAL Right 11/19/2018   Procedure: Right small finger metacarpal open malunion repair;  Surgeon: Bradly Bienenstock, MD;  Location: [facility];  Service: Orthopedics;  Laterality: Right;  90 minutes   TYMPANOSTOMY TUBE PLACEMENT Bilateral    placed age 40 have fallen out   UPPER GASTROINTESTINAL ENDOSCOPY     WISDOM TOOTH EXTRACTION  04/04/2021   Family History:  Family History  Problem Relation Age of Onset   Migraines Mother    Diabetes Father    Hyperlipidemia Father    Hypertension Father    COPD Father    Asthma Father    Migraines Brother    Stroke Maternal Grandmother    Colon cancer Neg Hx    Esophageal cancer Neg Hx    Rectal cancer Neg Hx    Stomach cancer Neg Hx    Family Psychiatric  History:  Family Psych History: Unknown Family Hx suicide: Unknown Social History:  Social History  Substance and Sexual Activity  Alcohol Use No   Alcohol/week: 0.0 standard drinks of alcohol     Social History   Substance and Sexual Activity  Drug Use No    Social History   Socioeconomic History   Marital status: Single    Spouse name: Not on file   Number of children: Not on file   Years of education: Not on file   Highest education level: 11th grade  Occupational History   Occupation: student  Tobacco Use   Smoking status: Some Days    Current packs/day: 0.25    Types:  Cigarettes   Smokeless tobacco: Never   Tobacco comments:    1 -2 cigs a day for the past two months   Vaping Use   Vaping status: Former  Substance and Sexual Activity   Alcohol use: No    Alcohol/week: 0.0 standard drinks of alcohol   Drug use: No   Sexual activity: Yes    Partners: Male  Other Topics Concern   Not on file  Social History Narrative   Patient lives with brother step sister and step dad Transport planner. Mom is involved. Right handed      06/21/2023 Pt states "I live on the street, I don't live with my step dad"   Social Drivers of Health   Financial Resource Strain: High Risk (02/25/2023)   Overall Financial Resource Strain (CARDIA)    Difficulty of Paying Living Expenses: Very hard  Food Insecurity: Food Insecurity Present (06/21/2023)   Hunger Vital Sign    Worried About Running Out of Food in the Last Year: Sometimes true    Ran Out of Food in the Last Year: Sometimes true  Transportation Needs: Unmet Transportation Needs (06/21/2023)   PRAPARE - Administrator, Civil Service (Medical): Yes    Lack of Transportation (Non-Medical): Yes  Physical Activity: Insufficiently Active (02/25/2023)   Exercise Vital Sign    Days of Exercise per Week: 3 days    Minutes of Exercise per Session: 30 min  Stress: Stress Concern Present (02/25/2023)   [employer] of Occupational Health - Occupational Stress Questionnaire    Feeling of Stress : To some extent  Social Connections: Socially Isolated (02/25/2023)   Social Connection and Isolation Panel [NHANES]    Frequency of Communication with Friends and Family: Twice a week    Frequency of Social Gatherings with Friends and Family: Once a week    Attends Religious Services: Never    Database administrator or Organizations: No    Attends Engineer, structural: Not on file    Marital Status: Separated   Additional Social History:                         Sleep: Good  Appetite:  Good  Current  Medications: Current Facility-Administered Medications  Medication Dose Route Frequency Provider Last Rate Last Admin   acetaminophen (TYLENOL) tablet 650 mg  650 mg Oral Q6H PRN White, Patrice L, NP       albuterol (VENTOLIN HFA) 108 (90 Base) MCG/ACT inhaler 2 puff  2 puff Inhalation Q4H PRN White, Patrice L, NP       alum & mag hydroxide-simeth (MAALOX/MYLANTA) 200-200-20 MG/5ML suspension 30 mL  30 mL Oral Q4H PRN White, Patrice L, NP       buPROPion (WELLBUTRIN XL) 24 hr tablet 300 mg  300 mg Oral Daily Sanjuana Mae, MD   300 mg at  06/24/23 0831   haloperidol (HALDOL) tablet 5 mg  5 mg Oral TID PRN White, Patrice L, NP       And   diphenhydrAMINE (BENADRYL) capsule 50 mg  50 mg Oral TID PRN White, Patrice L, NP       haloperidol lactate (HALDOL) injection 5 mg  5 mg Intramuscular TID PRN White, Patrice L, NP       And   diphenhydrAMINE (BENADRYL) injection 50 mg  50 mg Intramuscular TID PRN White, Patrice L, NP       And   LORazepam (ATIVAN) injection 2 mg  2 mg Intramuscular TID PRN White, Patrice L, NP       haloperidol lactate (HALDOL) injection 10 mg  10 mg Intramuscular TID PRN White, Patrice L, NP       And   diphenhydrAMINE (BENADRYL) injection 50 mg  50 mg Intramuscular TID PRN White, Patrice L, NP       And   LORazepam (ATIVAN) injection 2 mg  2 mg Intramuscular TID PRN White, Patrice L, NP       feeding supplement (ENSURE ENLIVE / ENSURE PLUS) liquid 237 mL  237 mL Oral BID BM Verner Chol, MD   237 mL at 06/23/23 1500   folic acid (FOLVITE) tablet 1 mg  1 mg Oral Daily Verner Chol, MD   1 mg at 06/23/23 0948   hydrOXYzine (ATARAX) tablet 25 mg  25 mg Oral TID PRN Liborio Nixon L, NP   25 mg at 06/23/23 2133   magnesium hydroxide (MILK OF MAGNESIA) suspension 30 mL  30 mL Oral Daily PRN White, Patrice L, NP       multivitamin with minerals tablet 1 tablet  1 tablet Oral Daily Verner Chol, MD   1 tablet at 06/23/23 0948   thiamine (VITAMIN B1) tablet 100 mg   100 mg Oral Daily Verner Chol, MD   100 mg at 06/23/23 0948   traZODone (DESYREL) tablet 50 mg  50 mg Oral QHS PRN White, Patrice L, NP   50 mg at 06/23/23 2133    Lab Results:  No results found for this or any previous visit (from the past 48 hours).   Blood Alcohol level:  Lab Results  Component Value Date   ETH <10 06/20/2023   ETH <10 09/02/2022    Metabolic Disorder Labs: Lab Results  Component Value Date   HGBA1C 4.7 (L) 06/20/2023   MPG 88.19 06/20/2023   MPG 93.93 03/04/2023   No results found for: "PROLACTIN" Lab Results  Component Value Date   CHOL 231 (H) 06/20/2023   TRIG 221 (H) 06/20/2023   HDL 46 06/20/2023   CHOLHDL 5.0 06/20/2023   VLDL 44 (H) 06/20/2023   LDLCALC 141 (H) 06/20/2023    Physical Findings: AIMS:  , ,  ,  ,    CIWA:    COWS:     Musculoskeletal: Strength & Muscle Tone: within normal limits Gait & Station: normal Patient leans: N/A  Psychiatric Specialty Exam:  Presentation  General Appearance:  Appropriate for Environment  Eye Contact: Fair  Speech: Clear and Coherent  Speech Volume: Normal  Handedness: Right   Mood and Affect  Mood: Anxious; Depressed  Affect: Congruent   Thought Process  Thought Processes: Coherent  Descriptions of Associations:Intact  Orientation:Full (Time, Place and Person)  Thought Content:Logical  History of Schizophrenia/Schizoaffective disorder:No data recorded Duration of Psychotic Symptoms:No data recorded Hallucinations:Hallucinations: None   Ideas of Reference:None  Suicidal Thoughts:denies  Homicidal Thoughts:Homicidal Thoughts: No    Sensorium  Memory: Immediate Fair; Remote Fair; Recent Fair  Judgment: Fair   Insight: Fair    Chartered certified accountant: Fair  Attention Span: Fair  Recall: [employer] of Knowledge: Fair  Language: Fair   Psychomotor Activity  Psychomotor Activity: Psychomotor Activity:  Normal    Assets  Assets: Communication Skills; Desire for Improvement; Physical Health   Sleep  Sleep:Sleep: Fair    Physical Exam: Physical Exam Vitals and nursing note reviewed.  Constitutional:      Appearance: Normal appearance.  HENT:     Head: Normocephalic and atraumatic.  Eyes:     Extraocular Movements: Extraocular movements intact.  Pulmonary:     Effort: Pulmonary effort is normal.  Musculoskeletal:        General: Normal range of motion.  Skin:    General: Skin is dry.  Neurological:     Mental Status: He is alert and oriented to person, place, and time. Mental status is at baseline.  Psychiatric:        Attention and Perception: Attention and perception normal.        Mood and Affect: Mood is anxious and depressed. Affect is blunt.        Speech: Speech normal.        Behavior: Behavior normal. Behavior is cooperative.        Thought Content: Thought content normal. Thought content does not include suicidal ideation.        Cognition and Memory: Cognition and memory normal.        Judgment: Judgment is impulsive.    Review of Systems  Psychiatric/Behavioral:  Positive for depression and substance abuse. The patient is nervous/anxious.   All other systems reviewed and are negative.  Blood pressure 103/65, pulse 69, temperature 98.4 F (36.9 C), resp. rate 20, height 5\' 10"  (1.778 m), weight 72.1 kg, SpO2 99%. Body mass index is 22.81 kg/m.   Treatment Plan Summary:    Safety and Monitoring:             -- Voluntary admission to inpatient psychiatric unit for safety, stabilization and treatment             -- Daily contact with patient to assess and evaluate symptoms and progress in treatment             -- Patient's case to be discussed in multi-disciplinary team meeting             -- Observation Level: q15 minute checks             -- Vital signs:  q12 hours             -- Precautions: suicide, elopement, and assault   2. Psychiatric  Diagnoses and Treatment:                06/24/23: Pt appears to be improving with increase in Wellbutrin, today he is denying SI, though he is dissatisfied with current med regimen. Discussed with him that there is no indication to start a second antidepressant if he is improving on one medication.  Will continue to monitor closely for further treatment and stabilization.  -- Continue Wellbutrin XL 300mg   daily for depression -- Continue Trazodone 50 mg HS PRN for sleep   -- The risks/benefits/side-effects/alternatives to this medication were discussed in detail with the patient and time was given for questions. The patient consents to medication trial.                --  Metabolic profile and EKG monitoring obtained while on an atypical antipsychotic (BMI:22.81 Lipid Panel: abnormal HbgA1c:4.7 QTc:420)              -- Encouraged patient to participate in unit milieu and in scheduled group therapies                            3. Medical Issues Being Addressed:      Asthma  -- Continue Ventolin HFA 108 MCG/ACT   4. Discharge Planning:              -- Social work and case management to assist with discharge planning and identification of hospital follow-up needs prior to discharge             -- Estimated LOS: 5-7 days             -- Discharge Concerns: Need to establish a safety plan; Medication compliance and effectiveness             -- Discharge Goals: Return home with outpatient referrals follow ups   Physician Treatment Plan for Primary Diagnosis: MDD (major depressive disorder), recurrent severe, without psychosis (HCC) Long Term Goal(s): Improvement in symptoms so as ready for discharge   Short Term Goals: Ability to identify changes in lifestyle to reduce recurrence of condition will improve, Ability to verbalize feelings will improve, Ability to disclose and discuss suicidal ideas, Ability to demonstrate self-control will improve, and Ability to identify and develop effective coping  behaviors will improve   Physician Treatment Plan for Secondary Diagnosis: Principal Problem:   MDD (major depressive disorder), recurrent severe, without psychosis (HCC)   Long Term Goal(s): Improvement in symptoms so as ready for discharge   Short Term Goals: Ability to identify changes in lifestyle to reduce recurrence of condition will improve, Ability to verbalize feelings will improve, Ability to disclose and discuss suicidal ideas, Ability to demonstrate self-control will improve, Ability to identify and develop effective coping behaviors will improve, and Ability to maintain clinical measurements within normal limits will improve  Basem Yannuzzi, PA-C 06/24/2023, 8:59 AM

## 2023-06-24 NOTE — Group Note (Signed)
 Recreation Therapy Group Note   Group Topic:Coping Skills  Group Date: 06/24/2023 Start Time: 1010 End Time: 1110 Facilitators: Rosina Lowenstein, LRT, CTRS Location:  Craft Room  Group Description: Mind Map.  Patient was provided a blank template of a diagram with 32 blank boxes in a tiered system, branching from the center (similar to a bubble chart). LRT directed patients to label the middle of the diagram "Coping Skills". LRT and patients then came up with 8 different coping skills as examples. Pt were directed to record their coping skills in the 2nd tier boxes closest to the center.  Patients would then share their coping skills with the group as LRT wrote them out. LRT gave a handout of 99 different coping skills at the end of group.   Goal Area(s) Addressed: Patients will be able to define "coping skills". Patient will identify new coping skills.  Patient will increase communication.   Affect/Mood: Appropriate   Participation Level: Minimal    Clinical Observations/Individualized Feedback: Christopher Forbes was originally present in group. Pt was pulled by PA. Pt was not present for majority of group.   Plan: Continue to engage patient in RT group sessions 2-3x/week.   Rosina Lowenstein, LRT, CTRS 06/24/2023 11:37 AM

## 2023-06-24 NOTE — Group Note (Signed)
 Recreation Therapy Group Note   Group Topic:Relaxation  Group Date: 06/24/2023 Start Time: 1530 End Time: 1610 Facilitators: Rosina Lowenstein, LRT, CTRS Location:  Dayroom  Group Description: Meditation. LRT and patients discussed what they know about meditation and mindfulness. LRT played a Deep Breathing Meditation exercise script for patients to follow along to. LRT and patients discussed how meditation and deep breathing can be used as a coping skill post--discharge to help manage symptoms of stress.   Goal Area(s) Addressed: Patient will practice using relaxation technique. Patient will identify a new coping skill.  Patient will follow multistep directions to reduce anxiety and stress.   Affect/Mood: Appropriate   Participation Level: Active   Participation Quality: Independent   Behavior: Appropriate   Speech/Thought Process: Coherent   Insight: Good   Judgement: Good   Modes of Intervention: Activity   Patient Response to Interventions:  Receptive   Education Outcome:  Acknowledges education   Clinical Observations/Individualized Feedback: Christopher Forbes was active in their participation of session activities and group discussion. Pt interacted well with LRT and peers duration of session.    Plan: Continue to engage patient in RT group sessions 2-3x/week.   Rosina Lowenstein, LRT, CTRS 06/24/2023 5:11 PM

## 2023-06-24 NOTE — Plan of Care (Signed)
  Problem: Education: Goal: Knowledge of [facility] Education information/materials will improve Outcome: Progressing   Problem: Education: Goal: Emotional status will improve Outcome: Progressing   Problem: Education: Goal: Mental status will improve Outcome: Progressing   Problem: Education: Goal: Verbalization of understanding the information provided will improve Outcome: Progressing   

## 2023-06-24 NOTE — Group Note (Signed)
 [facility] LCSW Group Therapy Note    Group Date: 06/24/2023 Start Time: 1300 End Time: 1400  Type of Therapy and Topic:  Group Therapy:  Overcoming Obstacles  Participation Level:  [facility] PARTICIPATION LEVEL: Minimal   Description of Group:   In this group patients will be encouraged to explore what they see as obstacles to their own wellness and recovery. They will be guided to discuss their thoughts, feelings, and behaviors related to these obstacles. The group will process together ways to cope with barriers, with attention given to specific choices patients can make. Each patient will be challenged to identify changes they are motivated to make in order to overcome their obstacles. This group will be process-oriented, with patients participating in exploration of their own experiences as well as giving and receiving support and challenge from other group members.  Therapeutic Goals: 1. Patient will identify personal and current obstacles as they relate to admission. 2. Patient will identify barriers that currently interfere with their wellness or overcoming obstacles.  3. Patient will identify feelings, thought process and behaviors related to these barriers. 4. Patient will identify two changes they are willing to make to overcome these obstacles:    Summary of Patient Progress Patient was present for the entirety of the group process. He shared that his drinking is something that he needs to overcome. Being outside in nature is something he mentioned as part of his toolbox. Although pt did not speak much he appeared to attend to the conversation. He appeared open and receptive to feedback/comments from both his peers and facilitator.   Therapeutic Modalities:   Cognitive Behavioral Therapy Solution Focused Therapy Motivational Interviewing Relapse Prevention Therapy   Glenis Smoker, LCSW

## 2023-06-24 NOTE — Plan of Care (Signed)
  Problem: Activity: Goal: Imbalance in normal sleep/wake cycle will improve Outcome: Progressing   Problem: Activity: Goal: Interest or engagement in leisure activities will improve Outcome: Progressing   Problem: Physical Regulation: Goal: Ability to maintain clinical measurements within normal limits will improve Outcome: Progressing   Problem: Activity: Goal: Interest or engagement in activities will improve Outcome: Progressing   Problem: Education: Goal: Mental status will improve Outcome: Progressing   Problem: Education: Goal: Emotional status will improve Outcome: Progressing

## 2023-06-24 NOTE — Group Note (Signed)
 Date:  06/24/2023 Time:  9:31 PM  Group Topic/Focus:  Rediscovering Joy:   The focus of this group is to explore various ways to relieve stress in a positive manner.    Participation Level:  Active  Participation Quality:  Appropriate, Attentive, Sharing, and Supportive  Affect:  Appropriate  Cognitive:  Alert and Appropriate  Insight: Appropriate and Good  Engagement in Group:  Engaged and Improving  Modes of Intervention:  Activity, Clarification, Discussion, Education, Orientation, Problem-solving, Rapport Building, and Support  Additional Comments:     Chela Sutphen 06/24/2023, 9:31 PM

## 2023-06-24 NOTE — [facility] Counselor (Signed)
 [name] attempted to touch base with patient's mother as patient has said to [name] that he wants to discharge to her home.   Mother did not answer.   [name] left HIPAA compliant voicemail.    Reymundo Poll, MSW, LCSWA 06/24/2023 3:01 PM

## 2023-06-24 NOTE — Progress Notes (Signed)
   06/24/23 1100  Psych Admission Type (Psych Patients Only)  Admission Status Voluntary  Psychosocial Assessment  Patient Complaints None  Eye Contact Fair  Facial Expression Animated  Affect Appropriate to circumstance  Speech Logical/coherent  Interaction Assertive  Motor Activity Slow  Appearance/Hygiene Unremarkable  Behavior Characteristics Cooperative;Appropriate to situation  Mood Pleasant  Thought Process  Coherency WDL  Content WDL  Delusions None reported or observed  Perception WDL  Hallucination None reported or observed  Judgment Impaired  Confusion None  Danger to Self  Current suicidal ideation? Denies  Danger to Others  Danger to Others None reported or observed

## 2023-06-24 NOTE — Progress Notes (Signed)
   06/22/23 2000  Psych Admission Type (Psych Patients Only)  Admission Status Voluntary  Psychosocial Assessment  Patient Complaints None  Eye Contact Fair  Facial Expression Animated  Affect Appropriate to circumstance  Speech Logical/coherent  Interaction Assertive  Motor Activity Slow  Appearance/Hygiene Unremarkable  Behavior Characteristics Cooperative;Appropriate to situation  Mood Depressed  Thought Process  Coherency WDL  Content WDL  Delusions WDL  Perception WDL  Hallucination None reported or observed  Judgment Limited  Confusion None  Danger to Self  Current suicidal ideation? Denies   Patient alert and oriented x 4, he denies SI/HI/AVH, he is interacting appropriately with peers and staff. 15 minutes safety checks maintained will continue to monitor.

## 2023-06-25 MED ORDER — BUPROPION HCL ER (XL) 300 MG PO TB24: 300.0000 mg | ORAL_TABLET | Freq: Every day | ORAL | 0 refills | Status: DC

## 2023-06-25 MED ORDER — TRAZODONE HCL 50 MG PO TABS: 50.0000 mg | ORAL_TABLET | Freq: Every evening | ORAL | 0 refills | Status: DC | PRN

## 2023-06-25 MED ORDER — HYDROXYZINE HCL 25 MG PO TABS: 25.0000 mg | ORAL_TABLET | Freq: Three times a day (TID) | ORAL | 0 refills | Status: DC | PRN
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
## 2023-06-25 NOTE — Progress Notes (Signed)
 Discharge Note:  Patient denies SI/HI/AVH at this time. Discharge instructions, AVS, prescriptions, and transition record reveiwed with patient. Patient agrees to comply with medication management, follow-up visit, and outpatient therapy. Patient belongings returned to patient. Patient questions and concerns addressed and answered. Patient ambulatory off unit @ 1345. Patient discharged to home with self care and transported via taxi

## 2023-06-25 NOTE — Group Note (Signed)
 Recreation Therapy Group Note   Group Topic:Goal Setting  Group Date: 06/25/2023 Start Time: 1000 End Time: 1110 Facilitators: Rosina Lowenstein, LRT, CTRS Location:  Craft Room  Group Description: Vision Boards. Patients were given many different magazines, a glue stick, markers, and a piece of cardstock paper. LRT and pts discussed the importance of having goals in life. LRT and pts discussed the difference between short-term and long-term goals, as well as what a SMART goal is. LRT encouraged pts to create a vision board, with images they picked and then cut out with safety scissors from the magazine, for themselves, that capture their short and long-term goals. LRT encouraged pts to show and explain their vision board to the group.   Goal Area(s) Addressed:  Patient will gain knowledge of short vs. long term goals.  Patient will identify goals for themselves. Patient will practice setting SMART goals. Patient will verbalize their goals to LRT and peers.   Affect/Mood: Appropriate   Participation Level: Active and Engaged   Participation Quality: Independent   Behavior: Appropriate, Calm, and Cooperative   Speech/Thought Process: Coherent   Insight: Good   Judgement: Good   Modes of Intervention: Art, Education, Exploration, and Guided Discussion   Patient Response to Interventions:  Attentive, Engaged, Interested , and Receptive   Education Outcome:  Acknowledges education   Clinical Observations/Individualized Feedback: Elic was active in their participation of session activities and group discussion. Pt identified "I want to quit smoking and open my own mechanic shop" as his goals. Pt interacted well with LRT and peers duration of session.    Plan: Continue to engage patient in RT group sessions 2-3x/week.   Rosina Lowenstein, LRT, CTRS 06/25/2023 11:35 AM

## 2023-06-25 NOTE — BHH Counselor (Signed)
  [name] attempted to touch base with patient's mother as patient has said to [name] that he wants to discharge to her home.    Mother did not answer.    [name] left HIPAA compliant voicemail.    Christopher Forbes, MSW, LCSWA 06/25/2023 9:53 AM

## 2023-06-25 NOTE — Progress Notes (Signed)
  [facility] Adult Case Management Discharge Plan :  Will you be returning to the same living situation after discharge:  Yes,  pt reports that he is going to his mothers home.  At discharge, do you have transportation home?: Yes,  [name] to assist with transportation needs.  Do you have the ability to pay for your medications: Yes,  [employer] / [employer]  Release of information consent forms completed and in the chart;  Patient's signature needed at discharge.  Patient to Follow up at:  Follow-up Information     [employer] [employer]. Go to.   Why: Following discharge, you will have to complete walk in hours to be established as a new patient Mon-Fri 8 AM - 4 PM.   You can also go to behavioral health urgent care on the lower level 24 hours/ 7 days a week to be set up as a new patient. Contact information: [address] [place] [address] [phone]                 Next level of care provider has access to [facility] Link:no  Safety Planning and Suicide Prevention discussed: Yes,  SPE completed with the patient and patient's mother.      Has patient been referred to the Quitline?: Patient refused referral for treatment  Patient has been referred for addiction treatment: Patient refused referral for treatment.  Harden Mo, LCSW 06/25/2023, 11:26 AM

## 2023-06-25 NOTE — Plan of Care (Signed)

## 2023-06-25 NOTE — Plan of Care (Signed)
   Problem: Education: Goal: Emotional status will improve Outcome: Progressing Goal: Mental status will improve Outcome: Progressing   Problem: Activity: Goal: Sleeping patterns will improve Outcome: Progressing

## 2023-06-25 NOTE — Group Note (Signed)
 LCSW Group Therapy Note  Group Date: 06/25/2023 Start Time: 1300 End Time: 1400   Type of Therapy and Topic:  Group Therapy: Using "I" Statements  Participation Level:  Minimal  Description of Group:  Patients were asked to provide details of some interpersonal conflicts they have experienced. Patients were then educated about "I" statements, communication which focuses on feelings or views of the speaker rather than what the other person is doing. T group members were asked to reflect on past conflicts and to provide specific examples for utilizing "I" statements.  Therapeutic Goals:  Patients will verbalize understanding of ineffective communication and effective communication. Patients will be able to empathize with whom they are having conflict. Patients will practice effective communication in the form of "I" statements.    Summary of Patient Progress:  Patient shared minimally. The patient was present throughout the session and proved open to feedback from [name] and peers. Patient demonstrated basic insight into the subject matter, was respectful of peers, and was present throughout the entire session.  Therapeutic Modalities:   Cognitive Behavioral Therapy Solution-Focused Therapy    Lowry Ram, LCSW 06/25/2023  2:02 PM

## 2023-06-25 NOTE — BHH Counselor (Signed)
 [name] spoke with the patient's mother.  Mother CONFIRMED patient can come to her home at discharge.    She provided her address.   Penni Homans, MSW, LCSW 06/25/2023 11:28 AM

## 2023-06-25 NOTE — Progress Notes (Signed)
   06/25/23 1100  Psych Admission Type (Psych Patients Only)  Admission Status Voluntary  Psychosocial Assessment  Patient Complaints None  Eye Contact Fair  Facial Expression Animated  Affect Appropriate to circumstance  Speech Logical/coherent  Interaction Assertive  Motor Activity Slow  Appearance/Hygiene Unremarkable  Behavior Characteristics Cooperative  Mood Pleasant  Thought Process  Coherency WDL  Content WDL  Delusions None reported or observed  Perception WDL  Hallucination None reported or observed  Judgment Impaired  Confusion None  Danger to Self  Current suicidal ideation? Denies  Danger to Others  Danger to Others None reported or observed

## 2023-07-10 ENCOUNTER — Emergency Department (HOSPITAL_COMMUNITY)
Admission: EM | Admit: 2023-07-10 | Discharge: 2023-07-11 | Discharge status: Against Medical Advice | Payer: MEDICAID | Attending: Emergency Medicine | Admitting: Emergency Medicine

## 2023-07-10 DIAGNOSIS — Z5321 Procedure and treatment not carried out due to patient leaving prior to being seen by health care provider: Secondary | ICD-10-CM | POA: Insufficient documentation

## 2023-07-10 DIAGNOSIS — X58XXXA Exposure to other specified factors, initial encounter: Secondary | ICD-10-CM | POA: Insufficient documentation

## 2023-07-10 DIAGNOSIS — S99912A Unspecified injury of left ankle, initial encounter: Secondary | ICD-10-CM | POA: Insufficient documentation

## 2023-07-10 NOTE — ED Triage Notes (Signed)
Pt has been called x 3 no answer 

## 2023-07-11 NOTE — ED Triage Notes (Signed)
 The pt apparently left just after he checked in  called x 4

## 2023-07-12 ENCOUNTER — Other Ambulatory Visit: Payer: Self-pay

## 2023-07-12 ENCOUNTER — Encounter (HOSPITAL_COMMUNITY): Payer: Self-pay | Admitting: *Deleted

## 2023-07-12 ENCOUNTER — Emergency Department (HOSPITAL_COMMUNITY)
Admission: EM | Admit: 2023-07-12 | Discharge: 2023-07-13 | Disposition: A | Discharge status: Home / Self Care | Payer: MEDICAID | Attending: Emergency Medicine | Admitting: Emergency Medicine

## 2023-07-12 DIAGNOSIS — S93492A Sprain of other ligament of left ankle, initial encounter: Secondary | ICD-10-CM | POA: Diagnosis not present

## 2023-07-12 DIAGNOSIS — K648 Other hemorrhoids: Secondary | ICD-10-CM | POA: Diagnosis not present

## 2023-07-12 DIAGNOSIS — R319 Hematuria, unspecified: Secondary | ICD-10-CM | POA: Diagnosis present

## 2023-07-12 DIAGNOSIS — Z5900 Homelessness unspecified: Secondary | ICD-10-CM | POA: Diagnosis not present

## 2023-07-12 DIAGNOSIS — X501XXA Overexertion from prolonged static or awkward postures, initial encounter: Secondary | ICD-10-CM | POA: Insufficient documentation

## 2023-07-12 LAB — COMPREHENSIVE METABOLIC PANEL WITH GFR
ALT: 22 U/L (ref 0–44)
AST: 26 U/L (ref 15–41)
Albumin: 4.2 g/dL (ref 3.5–5.0)
Alkaline Phosphatase: 46 U/L (ref 38–126)
Anion gap: 9 (ref 5–15)
BUN: 6 mg/dL (ref 6–20)
CO2: 25 mmol/L (ref 22–32)
Calcium: 9.2 mg/dL (ref 8.9–10.3)
Chloride: 105 mmol/L (ref 98–111)
Creatinine, Ser: 0.96 mg/dL (ref 0.61–1.24)
GFR, Estimated: >60 mL/min (ref >60)
Glucose, Bld: 107 mg/dL — ABNORMAL HIGH (ref 70–99)
Potassium: 3.6 mmol/L (ref 3.5–5.1)
Sodium: 139 mmol/L (ref 135–145)
Total Bilirubin: 0.7 mg/dL (ref 0.0–1.2)
Total Protein: 7 g/dL (ref 6.5–8.1)

## 2023-07-12 LAB — URINALYSIS, ROUTINE W REFLEX MICROSCOPIC
Bilirubin Urine: NEGATIVE
Glucose, UA: NEGATIVE mg/dL
Hgb urine dipstick: NEGATIVE
Ketones, ur: NEGATIVE mg/dL
Leukocytes,Ua: NEGATIVE
Nitrite: NEGATIVE
Protein, ur: NEGATIVE mg/dL
Specific Gravity, Urine: 1.008 (ref 1.005–1.030)
pH: 6 (ref 5.0–8.0)

## 2023-07-12 LAB — CBC
HCT: 44.2 % (ref 39.0–52.0)
Hemoglobin: 15 g/dL (ref 13.0–17.0)
MCH: 31.4 pg (ref 26.0–34.0)
MCHC: 33.9 g/dL (ref 30.0–36.0)
MCV: 92.5 fL (ref 80.0–100.0)
Platelets: 212 10*3/uL (ref 150–400)
RBC: 4.78 MIL/uL (ref 4.22–5.81)
RDW: 12.4 % (ref 11.5–15.5)
WBC: 6.1 10*3/uL (ref 4.0–10.5)
nRBC: 0 % (ref 0.0–0.2)

## 2023-07-12 LAB — LIPASE, BLOOD: Lipase: 32 U/L (ref 11–51)

## 2023-07-12 NOTE — ED Triage Notes (Signed)
 THE PT IS C/O BLOOD IN HIS URINE FOR 2 DAYS HE REPORTS THAT HIS  LT ANKLE HURTS

## 2023-07-13 ENCOUNTER — Emergency Department (HOSPITAL_COMMUNITY): Payer: MEDICAID

## 2023-07-13 LAB — POC OCCULT BLOOD, ED: Fecal Occult Bld: POSITIVE — AB

## 2023-07-13 NOTE — ED Notes (Signed)
 Ace bandage applied to patient's left ankle and foot.

## 2023-07-13 NOTE — ED Provider Notes (Signed)
 [place] EMERGENCY DEPARTMENT AT [facility] Provider Note   CSN: [id number] Arrival date & time: 07/12/23  2145     History  Chief Complaint  Patient presents with   Hematuria    Christopher Forbes is a 23 y.o. male patient reports 2 days of scant bright red blood on his underwear or when he has a bowel movement.  History of same in the past in context of hemorrhoids.  No pain in his rectum, no pain in his abdomen though mildly crampy prior to bowel movements.  Additionally rolled his ankle a few days ago on the left, requesting evaluation.  No nausea vomiting diarrhea or pulmonary symptoms.  Patient is currently unhoused.  He is on Wellbutrin , history of alcohol use but denies other recreational drug use.  I personally read the medical records.  History of homelessness, depression, anxiety.  HPI     Home Medications Prior to Admission medications   Medication Sig Start Date End Date Taking? Authorizing Provider  albuterol  (VENTOLIN  HFA) 108 (90 Base) MCG/ACT inhaler Inhale 2 puffs into the lungs every 4 (four) hours as needed for wheezing or shortness of breath. 09/03/22   Audria Leather, MD  buPROPion  (WELLBUTRIN  XL) 300 MG 24 hr tablet Take 1 tablet (300 mg total) by mouth daily. 06/25/23   Tingling, Trevor Fudge, PA-C  hydrOXYzine  (ATARAX ) 25 MG tablet Take 1 tablet (25 mg total) by mouth 3 (three) times daily as needed for anxiety. 06/25/23   Tingling, Trevor Fudge, PA-C  traZODone  (DESYREL ) 50 MG tablet Take 1 tablet (50 mg total) by mouth at bedtime as needed for sleep. 06/25/23   Tingling, Trevor Fudge, PA-C      Allergies    Chicken allergy, Milk-related compounds, and Egg-derived products    Review of Systems   Review of Systems  Gastrointestinal:  Positive for blood in stool.  Musculoskeletal:        Left ankle pain    Physical Exam Updated Vital Signs BP 113/82 (BP Location: Left Arm)   Pulse 80   Temp 97.8 F (36.6 C)   Resp 16   Ht 5\' 10"  (1.778 m)   Wt 72.1  kg   SpO2 100%   BMI 22.81 kg/m  Physical Exam Vitals and nursing note reviewed. Exam conducted with a chaperone present (Kaitlynn, ED RN.).  Constitutional:      Appearance: He is not ill-appearing or toxic-appearing.  HENT:     Head: Normocephalic and atraumatic.     Mouth/Throat:     Mouth: Mucous membranes are moist.     Pharynx: No oropharyngeal exudate or posterior oropharyngeal erythema.  Eyes:     General:        Right eye: No discharge.        Left eye: No discharge.     Conjunctiva/sclera: Conjunctivae normal.  Cardiovascular:     Rate and Rhythm: Normal rate and regular rhythm.     Pulses: Normal pulses.     Heart sounds: Normal heart sounds. No murmur heard. Pulmonary:     Effort: Pulmonary effort is normal. No respiratory distress.     Breath sounds: Normal breath sounds. No wheezing or rales.  Abdominal:     General: Bowel sounds are normal. There is no distension.     Palpations: Abdomen is soft.     Tenderness: There is no abdominal tenderness. There is no guarding or rebound.  Genitourinary:    Rectum: Guaiac result positive. Internal hemorrhoid present. No external hemorrhoid.  Comments: Palpable internal hemorrhoids, no TTP, no bogginess on internal exam. Musculoskeletal:        General: No swelling.     Cervical back: Neck supple.     Right lower leg: No edema.     Left lower leg: Normal. No edema.     Right ankle: Normal.     Left ankle: Tenderness present over the lateral malleolus, ATF ligament and AITF ligament. No medial malleolus tenderness.     Left Achilles Tendon: Normal.     Left foot: Normal.  Skin:    General: Skin is warm and dry.     Capillary Refill: Capillary refill takes less than 2 seconds.  Neurological:     General: No focal deficit present.     Mental Status: He is alert and oriented to person, place, and time. Mental status is at baseline.  Psychiatric:        Mood and Affect: Mood normal.    ED Results / Procedures /  Treatments   Labs (all labs ordered are listed, but only abnormal results are displayed) Labs Reviewed  COMPREHENSIVE METABOLIC PANEL WITH GFR - Abnormal; Notable for the following components:      Result Value   Glucose, Bld 107 (*)    All other components within normal limits  POC OCCULT BLOOD, ED - Abnormal; Notable for the following components:   Fecal Occult Bld POSITIVE (*)    All other components within normal limits  LIPASE, BLOOD  CBC  URINALYSIS, ROUTINE W REFLEX MICROSCOPIC    EKG None  Radiology DG Ankle Complete Left Result Date: 07/13/2023 CLINICAL DATA:  Fall. EXAM: LEFT ANKLE COMPLETE - 3 VIEW COMPARISON:  05/03/2023 FINDINGS: There is no evidence of fracture, dislocation, or joint effusion. There is no evidence of arthropathy or other focal bone abnormality. Soft tissues are unremarkable. IMPRESSION: Negative. Electronically Signed   By: Ronnette Coke M.D.   On: 07/13/2023 04:30    Procedures Procedures    Medications Ordered in ED Medications - No data to display  ED Course/ Medical Decision Making/ A&P                                 Medical Decision Making 23 y/o male who presents with concern for left ankle pain and scant bright red blood per rectum during bowel movements.  Vital signs reassuring and taken with Raponer exam unremarkable, abdominal exam is benign.  GU exam as above with DRE with internal hemorrhoids with scant blood on the gloved finger this provider following exam.  Nonmelanotic appearing stool in the gloved finger this provider following exam.  DDx includes not limited to hemorrhoids, anal fissure, fistula, anal tags, abscess, constipation, GI bleed.  Amount and/or Complexity of Data Reviewed Labs:     Details: CBC unremarkable, CMP unremarkable, UA unremarkable, Hemoccult test positive, however this was from palpable bleeding internal hemorrhoid.   Radiology: ordered.    Details: Plain film of the left ankle  unremarkable.   Clinical picture consistent with with internal hemorrhoid as etiology of patient's scant rectal bleeding.  Will recommend sitz bath's, stool softeners.  Additionally likely ankle sprain of the left ankle without evidence of acute fracture or dislocation.  Clinical concern for emergent underlying condition that would warrant further ED workup and patient management is exceedingly low.  [place]  voiced understanding of her medical evaluation and treatment plan. Each of their questions answered to their expressed  satisfaction.  Return precautions were given.  Patient is well-appearing, stable, and was discharged in good condition.  This chart was dictated using voice recognition software, Dragon. Despite the best efforts of this provider to proofread and correct errors, errors may still occur which can change documentation meaning.       Final Clinical Impression(s) / ED Diagnoses Final diagnoses:  None    Rx / DC Orders ED Discharge Orders     None         Arlyne Lame 07/13/23 0507    Lindle Rhea, MD 07/13/23 [phone]

## 2023-07-13 NOTE — Discharge Instructions (Addendum)
 You are seen here today for your bleeding with bowel movements.  You have an internal hemorrhoid.  You may use over-the-counter stool softeners such as MiraLAX or Colace to help your stools be easier to pass to prevent straining during bowel movements.  You may follow-up with the clinic listed below.  In regard to your ankle pain there are no broken bones on your x-ray,, you have an ankle sprain.  You may use provided Ace wrap as needed in addition to Tylenol  or ibuprofen .  Return to the ER with any new or severe symptoms.

## 2023-07-20 ENCOUNTER — Other Ambulatory Visit: Payer: Self-pay

## 2023-07-20 ENCOUNTER — Emergency Department (HOSPITAL_COMMUNITY)
Admission: EM | Admit: 2023-07-20 | Discharge: 2023-07-20 | Disposition: A | Discharge status: Home / Self Care | Payer: MEDICAID | Attending: Emergency Medicine | Admitting: Emergency Medicine

## 2023-07-20 ENCOUNTER — Emergency Department (HOSPITAL_COMMUNITY): Payer: MEDICAID

## 2023-07-20 DIAGNOSIS — R0789 Other chest pain: Secondary | ICD-10-CM | POA: Insufficient documentation

## 2023-07-20 DIAGNOSIS — R519 Headache, unspecified: Secondary | ICD-10-CM | POA: Diagnosis present

## 2023-07-20 DIAGNOSIS — G44209 Tension-type headache, unspecified, not intractable: Secondary | ICD-10-CM | POA: Diagnosis not present

## 2023-07-20 MED ORDER — KETOROLAC TROMETHAMINE 30 MG/ML IJ SOLN
30.0000 mg | Freq: Once | INTRAMUSCULAR | Status: AC
  Administered 2023-07-20: 30 mg via INTRAMUSCULAR
  Filled 2023-07-20: qty 1

## 2023-07-20 NOTE — ED Provider Notes (Signed)
 [place] EMERGENCY DEPARTMENT AT [facility] Provider Note   CSN: [id number] Arrival date & time: 07/20/23  2051     History {Add pertinent medical, surgical, social history, OB history to HPI:1} Chief Complaint  Patient presents with   Chest Pain    Radhames Linley Penaranda is a 23 y.o. male.  Patient to ED for evaluation of chest pain for the past 2-3 days that comes and goes. Nothing he does makes the pain start, nothing makes it better. No SOB, pain with breathing, cough, fever, congestion. The pain goes across the anterior chest. No nausea or vomiting. He also reports a headache that started on his way to the hospital. He reports a history of migraine headaches similar to current pain. No light sensitivity. He describes the headache as bilateral frontal extending to occipital.   The history is provided by the patient. No language interpreter was used.  Chest Pain      Home Medications Prior to Admission medications   Medication Sig Start Date End Date Taking? Authorizing Provider  albuterol  (VENTOLIN  HFA) 108 (90 Base) MCG/ACT inhaler Inhale 2 puffs into the lungs every 4 (four) hours as needed for wheezing or shortness of breath. 09/03/22   Audria Leather, MD  buPROPion  (WELLBUTRIN  XL) 300 MG 24 hr tablet Take 1 tablet (300 mg total) by mouth daily. 06/25/23   Tingling, Trevor Fudge, PA-C  hydrOXYzine  (ATARAX ) 25 MG tablet Take 1 tablet (25 mg total) by mouth 3 (three) times daily as needed for anxiety. 06/25/23   Tingling, Trevor Fudge, PA-C  traZODone  (DESYREL ) 50 MG tablet Take 1 tablet (50 mg total) by mouth at bedtime as needed for sleep. 06/25/23   Tingling, Trevor Fudge, PA-C      Allergies    Chicken allergy, Milk-related compounds, and Egg-derived products    Review of Systems   Review of Systems  Cardiovascular:  Positive for chest pain.    Physical Exam Updated Vital Signs BP 132/84 (BP Location: Right Arm)   Pulse 86   Temp 98 F (36.7 C) (Oral)   Resp 18   Ht 5'  10" (1.778 m)   Wt 72.1 kg   SpO2 99%   BMI 22.81 kg/m  Physical Exam Vitals and nursing note reviewed.  Constitutional:      Appearance: He is well-developed.  HENT:     Head: Normocephalic.     Comments: Palpation of the scalp makes the headache worse.  Cardiovascular:     Rate and Rhythm: Normal rate and regular rhythm.     Heart sounds: No murmur heard. Pulmonary:     Effort: Pulmonary effort is normal.     Breath sounds: Normal breath sounds. No wheezing, rhonchi or rales.  Abdominal:     General: Bowel sounds are normal.     Palpations: Abdomen is soft.     Tenderness: There is no abdominal tenderness. There is no guarding or rebound.  Musculoskeletal:        General: Normal range of motion.     Cervical back: Normal range of motion and neck supple.  Skin:    General: Skin is warm and dry.  Neurological:     General: No focal deficit present.     Mental Status: He is alert and oriented to person, place, and time.     ED Results / Procedures / Treatments   Labs (all labs ordered are listed, but only abnormal results are displayed) Labs Reviewed - No data to display  EKG None  Radiology No results found.  Procedures Procedures  {Document cardiac monitor, telemetry assessment procedure when appropriate:1}  Medications Ordered in ED Medications  ketorolac  (TORADOL ) 30 MG/ML injection 30 mg (has no administration in time range)    ED Course/ Medical Decision Making/ A&P   {   Click here for ABCD2, HEART and other calculatorsREFRESH Note before signing :1}                              Medical Decision Making This patient presents to the ED for concern of chest pain, this involves an extensive number of treatment options, and is a complaint that carries with it a high risk of complications and morbidity.  The differential diagnosis includes ACS, PE PTX, PNA, MSK chest wall pain, dissection,  Patient also reports a Headache, this involves an extensive number  of treatment options, and is a complaint that carries with it a high risk of complications and morbidity.  The differential diagnosis includes Bleed, migraine, mass, CVA, sinus pain, tension headache   Co morbidities that complicate the patient evaluation  Smoker   Additional history obtained:  Additional history and/or information obtained from chart review, notable for frequent ED visits over the last month   Lab Tests:  I Ordered, and personally interpreted labs.  The pertinent results include:  ***    Imaging Studies ordered:  I ordered imaging studies including *** I independently visualized and interpreted imaging which showed *** I agree with the radiologist interpretation   Cardiac Monitoring:  The patient was maintained on a cardiac monitor.  I personally viewed and interpreted the cardiac monitored which showed an underlying rhythm of: ***   Medicines ordered and prescription drug management:  I ordered medication including ***  for *** Reevaluation of the patient after these medicines showed that the patient {resolved/improved/worsened:23923::"improved"} I have reviewed the patients home medicines and have made adjustments as needed   Test Considered:  ***   Critical Interventions:  ***   Consultations Obtained:  I requested consultation with the ***,  and discussed lab and imaging findings as well as pertinent plan - they recommend: ***   Problem List / ED Course:  ***   Reevaluation:  After the interventions noted above, I reevaluated the patient and found that they have :{resolved/improved/worsened:23923::"improved"}   Social Determinants of Health:  ***   Disposition:  After consideration of the diagnostic results and the patients response to treatment, I feel that the patient would benefit from ***.   Amount and/or Complexity of Data Reviewed Radiology: ordered.  Risk Prescription drug management.  ch ***  {Document  critical care time when appropriate:1} {Document review of labs and clinical decision tools ie heart score, Chads2Vasc2 etc:1}  {Document your independent review of radiology images, and any outside records:1} {Document your discussion with family members, caretakers, and with consultants:1} {Document social determinants of health affecting pt's care:1} {Document your decision making why or why not admission, treatments were needed:1} Final Clinical Impression(s) / ED Diagnoses Final diagnoses:  None    Rx / DC Orders ED Discharge Orders     None

## 2023-07-20 NOTE — ED Triage Notes (Signed)
 The pt is c/o some chest pain for 2-3 days minimal diff breathing    no temp

## 2023-07-20 NOTE — ED Triage Notes (Signed)
 The pt also has migraine headaches and he has one now

## 2023-07-20 NOTE — Discharge Instructions (Addendum)
 Take ibuprofen  every 6 hours for chest wall and tension type headache pain. Follow up with a primary care provider of your choice if pain continues.

## 2023-07-23 ENCOUNTER — Other Ambulatory Visit (HOSPITAL_COMMUNITY): Payer: Self-pay

## 2023-07-23 ENCOUNTER — Other Ambulatory Visit: Payer: Self-pay | Admitting: Family Medicine

## 2023-07-23 NOTE — Telephone Encounter (Signed)
 Pt was seen in ED on 06/20/23. Unsure if medication changes were made.

## 2023-07-24 ENCOUNTER — Other Ambulatory Visit (HOSPITAL_COMMUNITY): Payer: Self-pay

## 2023-08-07 ENCOUNTER — Other Ambulatory Visit: Payer: Self-pay | Admitting: Family Medicine

## 2023-08-07 ENCOUNTER — Other Ambulatory Visit (HOSPITAL_COMMUNITY): Payer: Self-pay

## 2023-08-07 MED ORDER — OMEPRAZOLE 40 MG PO CPDR
40.0000 mg | DELAYED_RELEASE_CAPSULE | Freq: Two times a day (BID) | ORAL | 0 refills | Status: DC
  Filled 2023-08-07: qty 60, 30d supply, fill #0

## 2023-08-07 MED ORDER — ONDANSETRON 4 MG PO TBDP
4.0000 mg | ORAL_TABLET | Freq: Three times a day (TID) | ORAL | 0 refills | Status: DC | PRN
  Filled 2023-08-07: qty 20, 7d supply, fill #0

## 2023-08-07 MED ORDER — BUPROPION HCL ER (XL) 300 MG PO TB24
300.0000 mg | ORAL_TABLET | Freq: Every day | ORAL | 0 refills | Status: DC
  Filled 2023-08-07: qty 30, 30d supply, fill #0

## 2023-08-07 MED ORDER — ESCITALOPRAM OXALATE 20 MG PO TABS
20.0000 mg | ORAL_TABLET | Freq: Every day | ORAL | 1 refills | Status: DC
  Filled 2023-08-07: qty 90, 90d supply, fill #0

## 2023-08-07 NOTE — Telephone Encounter (Signed)
 Pls contact pt to schedule appt with Dr. Augustus Ledger for Mood medication refills. Past due since January. Thx.

## 2023-08-07 NOTE — Telephone Encounter (Signed)
 Called patient and mother's phone no answer and no way to leave a voicemail  Phone just rang and rang

## 2023-08-19 ENCOUNTER — Other Ambulatory Visit (HOSPITAL_COMMUNITY): Payer: Self-pay

## 2023-11-10 ENCOUNTER — Emergency Department (HOSPITAL_COMMUNITY)
Admission: EM | Admit: 2023-11-10 | Discharge: 2023-11-10 | Disposition: A | Discharge status: Home / Self Care | Payer: MEDICAID

## 2023-11-10 ENCOUNTER — Encounter (HOSPITAL_COMMUNITY): Payer: Self-pay | Admitting: Emergency Medicine

## 2023-11-10 ENCOUNTER — Other Ambulatory Visit: Payer: Self-pay

## 2023-11-10 DIAGNOSIS — T43615A Adverse effect of caffeine, initial encounter: Secondary | ICD-10-CM | POA: Insufficient documentation

## 2023-11-10 DIAGNOSIS — R209 Unspecified disturbances of skin sensation: Secondary | ICD-10-CM | POA: Insufficient documentation

## 2023-11-10 DIAGNOSIS — Z5900 Homelessness unspecified: Secondary | ICD-10-CM | POA: Diagnosis not present

## 2023-11-10 LAB — CBC WITH DIFFERENTIAL/PLATELET
Abs Immature Granulocytes: 0.02 K/uL (ref 0.00–0.07)
Basophils Absolute: 0 K/uL (ref 0.0–0.1)
Basophils Relative: 1 %
Eosinophils Absolute: 0.1 K/uL (ref 0.0–0.5)
Eosinophils Relative: 1 %
HCT: 51.1 % (ref 39.0–52.0)
Hemoglobin: 16.5 g/dL (ref 13.0–17.0)
Immature Granulocytes: 0 %
Lymphocytes Relative: 29 %
Lymphs Abs: 1.9 K/uL (ref 0.7–4.0)
MCH: 29.4 pg (ref 26.0–34.0)
MCHC: 32.3 g/dL (ref 30.0–36.0)
MCV: 91.1 fL (ref 80.0–100.0)
Monocytes Absolute: 0.6 K/uL (ref 0.1–1.0)
Monocytes Relative: 10 %
Neutro Abs: 3.9 K/uL (ref 1.7–7.7)
Neutrophils Relative %: 59 %
Platelets: 259 K/uL (ref 150–400)
RBC: 5.61 MIL/uL (ref 4.22–5.81)
RDW: 11.9 % (ref 11.5–15.5)
WBC: 6.5 K/uL (ref 4.0–10.5)
nRBC: 0 % (ref 0.0–0.2)

## 2023-11-10 LAB — BASIC METABOLIC PANEL WITH GFR
Anion gap: 11 (ref 5–15)
BUN: 10 mg/dL (ref 6–20)
CO2: 26 mmol/L (ref 22–32)
Calcium: 10 mg/dL (ref 8.9–10.3)
Chloride: 97 mmol/L — ABNORMAL LOW (ref 98–111)
Creatinine, Ser: 0.94 mg/dL (ref 0.61–1.24)
GFR, Estimated: >60 mL/min (ref >60)
Glucose, Bld: 102 mg/dL — ABNORMAL HIGH (ref 70–99)
Potassium: 3.7 mmol/L (ref 3.5–5.1)
Sodium: 134 mmol/L — ABNORMAL LOW (ref 135–145)

## 2023-11-10 NOTE — ED Provider Notes (Signed)
 [facility] Provider Note   CSN: [id number] Arrival date & time: 11/10/23  2052     Patient presents with: Allergic Reaction   Christopher Forbes is a 23 y.o. male.   23 year old male presents today for concern of allergic reaction.  He states he drank c4 for energy drink which had 200 mg.  He states he is homeless and he had a long walk ahead of him so he drank this around 7:15 PM.  He sat down for a while to put on his music before he started on the walk when he suddenly had this generalized pins and needle sensation.  Denies chest pain, shortness of breath, rash, wheezing, or nausea.  The history is provided by the patient. No language interpreter was used.       Prior to Admission medications   Medication Sig Start Date End Date Taking? Authorizing Provider  albuterol  (VENTOLIN  HFA) 108 (90 Base) MCG/ACT inhaler Inhale 2 puffs into the lungs every 4 (four) hours as needed for wheezing or shortness of breath. 09/03/22   Cheryle Page, MD  buPROPion  (WELLBUTRIN  XL) 300 MG 24 hr tablet Take 1 tablet (300 mg total) by mouth daily. 06/25/23   Tingling, Corean, PA-C  buPROPion  (WELLBUTRIN  XL) 300 MG 24 hr tablet Take 1 tablet (300 mg total) by mouth daily. APPT NEEDED FOR REFILLS. 08/07/23   Alvia Bring, DO  escitalopram  (LEXAPRO ) 20 MG tablet Take 1 tablet (20 mg total) by mouth daily. APPT NEEDED FOR REFILLS 08/07/23   Alvia Bring, DO  hydrOXYzine  (ATARAX ) 25 MG tablet Take 1 tablet (25 mg total) by mouth 3 (three) times daily as needed for anxiety. 06/25/23   Tingling, Corean, PA-C  omeprazole  (PRILOSEC) 40 MG capsule Take 1 capsule (40 mg total) by mouth in the morning and at bedtime. 08/07/23   Alvia Bring, DO  ondansetron  (ZOFRAN -ODT) 4 MG disintegrating tablet Take 1 tablet (4 mg total) by mouth every 8 (eight) hours as needed for nausea or vomiting. 08/07/23   Alvia Bring, DO  traZODone  (DESYREL ) 50 MG tablet Take 1 tablet (50  mg total) by mouth at bedtime as needed for sleep. 06/25/23   Tingling, Corean, PA-C    Allergies: Chicken allergy, Milk-related compounds, and Egg-derived products    Review of Systems  Updated Vital Signs BP 120/89   Temp 98.1 F (36.7 C) (Oral)   Resp 12   SpO2 100%   Physical Exam Vitals and nursing note reviewed.  Constitutional:      General: He is not in acute distress.    Appearance: Normal appearance. He is not ill-appearing.  HENT:     Head: Normocephalic and atraumatic.     Nose: Nose normal.     Mouth/Throat:     Mouth: Mucous membranes are moist.     Pharynx: No oropharyngeal exudate or posterior oropharyngeal erythema.     Comments: Normal speech.  Speaks in complete sentences. Eyes:     Conjunctiva/sclera: Conjunctivae normal.  Cardiovascular:     Rate and Rhythm: Normal rate.  Pulmonary:     Effort: Pulmonary effort is normal. No respiratory distress.     Breath sounds: Normal breath sounds. No wheezing.  Musculoskeletal:        General: No deformity. Normal range of motion.     Cervical back: Normal range of motion.  Skin:    Findings: No rash.  Neurological:     Mental Status: He is alert.     (  all labs ordered are listed, but only abnormal results are displayed) Labs Reviewed  BASIC METABOLIC PANEL WITH GFR - Abnormal; Notable for the following components:      Result Value   Sodium 134 (*)    Chloride 97 (*)    Glucose, Bld 102 (*)    All other components within normal limits  CBC WITH DIFFERENTIAL/PLATELET    EKG: EKG Interpretation Date/Time:  Sunday November 10 2023 22:12:23 EDT Ventricular Rate:  76 PR Interval:  161 QRS Duration:  91 QT Interval:  370 QTC Calculation: 416 R Axis:   87  Text Interpretation: Sinus rhythm ST elev, probable normal early repol pattern Compared with previous EKG from 07/20/2023 Confirmed by Gennaro Bouchard ([id number]) on 11/10/2023 10:15:02 PM  Radiology: No results found.   Procedures   Medications  Ordered in the ED - No data to display                                  Medical Decision Making Amount and/or Complexity of Data Reviewed Labs: ordered.   23 year old male presents today for concern of reaction after taking C4 energy drink.  No signs of anaphylactic reaction. He was already given Benadryl .  Feels significantly improved.  EKG obtained.  No acute ischemic change.  CBC unremarkable, BMP without acute concern.  Discharged in stable condition. Return precaution discussed.   Final diagnoses:  Adverse effect of caffeine, initial encounter    ED Discharge Orders     None          Hildegard Loge, PA-C 11/10/23 2342    Gennaro Bouchard L, DO 11/11/23 1635

## 2023-11-10 NOTE — Discharge Instructions (Signed)
 No concerning findings on your blood work.  EKG was reassuring.  You likely had a reaction from the significant in the caffeine and the C4.  No evidence of anaphylactic allergic reaction.  Return for any emergent symptoms.  Establish a primary care doctor.

## 2023-11-10 NOTE — ED Triage Notes (Signed)
 BIB [facility] w/ c/o allergic reaction. Drank a C4 energy drink & reports itching all over body. No hives noted. No resp distress.  50mg  benadryl  given 30 mins PTA VSS

## 2023-11-19 ENCOUNTER — Encounter: Payer: Self-pay | Admitting: Sports Medicine

## 2023-11-25 LAB — AMB RESULTS CONSOLE CBG: Glucose: 98

## 2023-11-25 NOTE — Progress Notes (Signed)
 Pt screened for HTN, non Fasting blood sugar. VSS. BS WNL. SDOH needs addressed and food bag given out with other community resources. Encouraged him to follow up with [facility]

## 2023-12-13 ENCOUNTER — Other Ambulatory Visit: Payer: Self-pay

## 2023-12-13 ENCOUNTER — Emergency Department (HOSPITAL_COMMUNITY)
Admission: EM | Admit: 2023-12-13 | Discharge: 2023-12-14 | Disposition: A | Discharge status: Home / Self Care | Payer: MEDICAID | Attending: Emergency Medicine | Admitting: Emergency Medicine

## 2023-12-13 ENCOUNTER — Encounter (HOSPITAL_COMMUNITY): Payer: Self-pay | Admitting: *Deleted

## 2023-12-13 DIAGNOSIS — R109 Unspecified abdominal pain: Secondary | ICD-10-CM | POA: Diagnosis not present

## 2023-12-13 DIAGNOSIS — R079 Chest pain, unspecified: Secondary | ICD-10-CM | POA: Insufficient documentation

## 2023-12-13 DIAGNOSIS — R112 Nausea with vomiting, unspecified: Secondary | ICD-10-CM | POA: Insufficient documentation

## 2023-12-13 DIAGNOSIS — J45909 Unspecified asthma, uncomplicated: Secondary | ICD-10-CM | POA: Diagnosis not present

## 2023-12-13 LAB — URINALYSIS, ROUTINE W REFLEX MICROSCOPIC
Bacteria, UA: NONE SEEN
Bilirubin Urine: NEGATIVE
Glucose, UA: NEGATIVE mg/dL
Hgb urine dipstick: NEGATIVE
Ketones, ur: NEGATIVE mg/dL
Leukocytes,Ua: NEGATIVE
Nitrite: NEGATIVE
Protein, ur: NEGATIVE mg/dL
Specific Gravity, Urine: 1.023 (ref 1.005–1.030)
pH: 7 (ref 5.0–8.0)

## 2023-12-13 LAB — CBC WITH DIFFERENTIAL/PLATELET
Abs Immature Granulocytes: 0.02 K/uL (ref 0.00–0.07)
Basophils Absolute: 0 K/uL (ref 0.0–0.1)
Basophils Relative: 0 %
Eosinophils Absolute: 0 K/uL (ref 0.0–0.5)
Eosinophils Relative: 1 %
HCT: 47.6 % (ref 39.0–52.0)
Hemoglobin: 15.7 g/dL (ref 13.0–17.0)
Immature Granulocytes: 0 %
Lymphocytes Relative: 5 %
Lymphs Abs: 0.4 K/uL — ABNORMAL LOW (ref 0.7–4.0)
MCH: 30.1 pg (ref 26.0–34.0)
MCHC: 33 g/dL (ref 30.0–36.0)
MCV: 91.4 fL (ref 80.0–100.0)
Monocytes Absolute: 0.4 K/uL (ref 0.1–1.0)
Monocytes Relative: 5 %
Neutro Abs: 7.2 K/uL (ref 1.7–7.7)
Neutrophils Relative %: 89 %
Platelets: 197 K/uL (ref 150–400)
RBC: 5.21 MIL/uL (ref 4.22–5.81)
RDW: 11.8 % (ref 11.5–15.5)
WBC: 8.1 K/uL (ref 4.0–10.5)
nRBC: 0 % (ref 0.0–0.2)

## 2023-12-13 LAB — COMPREHENSIVE METABOLIC PANEL WITH GFR
ALT: 19 U/L (ref 0–44)
AST: 26 U/L (ref 15–41)
Albumin: 4.6 g/dL (ref 3.5–5.0)
Alkaline Phosphatase: 64 U/L (ref 38–126)
Anion gap: 12 (ref 5–15)
BUN: 14 mg/dL (ref 6–20)
CO2: 21 mmol/L — ABNORMAL LOW (ref 22–32)
Calcium: 9.3 mg/dL (ref 8.9–10.3)
Chloride: 105 mmol/L (ref 98–111)
Creatinine, Ser: 0.86 mg/dL (ref 0.61–1.24)
GFR, Estimated: >60 mL/min (ref >60)
Glucose, Bld: 112 mg/dL — ABNORMAL HIGH (ref 70–99)
Potassium: 3.6 mmol/L (ref 3.5–5.1)
Sodium: 138 mmol/L (ref 135–145)
Total Bilirubin: 0.8 mg/dL (ref 0.0–1.2)
Total Protein: 7.5 g/dL (ref 6.5–8.1)

## 2023-12-13 LAB — I-STAT CHEM 8, ED
BUN: 17 mg/dL (ref 6–20)
Calcium, Ion: 1.12 mmol/L — ABNORMAL LOW (ref 1.15–1.40)
Chloride: 106 mmol/L (ref 98–111)
Creatinine, Ser: 0.8 mg/dL (ref 0.61–1.24)
Glucose, Bld: 110 mg/dL — ABNORMAL HIGH (ref 70–99)
HCT: 47 % (ref 39.0–52.0)
Hemoglobin: 16 g/dL (ref 13.0–17.0)
Potassium: 3.7 mmol/L (ref 3.5–5.1)
Sodium: 141 mmol/L (ref 135–145)
TCO2: 22 mmol/L (ref 22–32)

## 2023-12-13 LAB — RAPID URINE DRUG SCREEN, HOSP PERFORMED
Amphetamines: NOT DETECTED
Barbiturates: NOT DETECTED
Benzodiazepines: NOT DETECTED
Cocaine: NOT DETECTED
Opiates: POSITIVE — AB
Tetrahydrocannabinol: NOT DETECTED

## 2023-12-13 NOTE — ED Provider Triage Note (Signed)
 Emergency Medicine Provider Triage Evaluation Note  Darrik Richman , a 23 y.o. male  was evaluated in triage.  Pt complains of chest pain, n/v (non bloody), abdominal pain, onset 3 hours PTA. Walking to go charge his phone and started vomiting, didn't think anything of it, symptoms progressively worsening. Body pain, feeling weak.  Recently went to [place] Otherwise healthy.  Review of Systems  Positive: Vomiting, CP, abdominal pain Negative: fever  Physical Exam  BP 120/74 (BP Location: Right Arm)   Pulse (!) 125   Temp 98.9 F (37.2 C)   Resp 16   Ht 5' 10 (1.778 m)   Wt 72.1 kg   SpO2 99%   BMI 22.81 kg/m  Gen:   Awake, no distress   Resp:  Normal effort  MSK:   Moves extremities without difficulty  Other:    Medical Decision Making  Medically screening exam initiated at 9:54 PM.  Appropriate orders placed.  Luana Ludie Dory was informed that the remainder of the evaluation will be completed by another provider, this initial triage assessment does not replace that evaluation, and the importance of remaining in the ED until their evaluation is complete.     Beverley Leita LABOR, PA-C 12/13/23 2156

## 2023-12-13 NOTE — ED Notes (Signed)
 Pt requested nausea medication. Triage RN made aware.

## 2023-12-13 NOTE — ED Triage Notes (Signed)
 For 3 hours the pt reports vomiting abd pain chest pain

## 2023-12-14 ENCOUNTER — Emergency Department (HOSPITAL_COMMUNITY): Payer: MEDICAID

## 2023-12-14 LAB — TROPONIN I (HIGH SENSITIVITY): Troponin I (High Sensitivity): 3 ng/L (ref <18)

## 2023-12-14 LAB — LIPASE, BLOOD: Lipase: 29 U/L (ref 11–51)

## 2023-12-14 MED ORDER — ONDANSETRON 4 MG PO TBDP
4.0000 mg | ORAL_TABLET | Freq: Four times a day (QID) | ORAL | 0 refills | Status: DC | PRN
  Filled 2023-12-14: qty 20, 5d supply, fill #0

## 2023-12-14 MED ORDER — ALUM & MAG HYDROXIDE-SIMETH 200-200-20 MG/5ML PO SUSP
30.0000 mL | Freq: Once | ORAL | Status: AC
  Administered 2023-12-14: 30 mL via ORAL
  Filled 2023-12-14: qty 30

## 2023-12-14 MED ORDER — SODIUM CHLORIDE 0.9 % IV BOLUS
1000.0000 mL | Freq: Once | INTRAVENOUS | Status: AC
  Administered 2023-12-14: 1000 mL via INTRAVENOUS

## 2023-12-14 MED ORDER — LIDOCAINE VISCOUS HCL 2 % MT SOLN
15.0000 mL | Freq: Once | OROMUCOSAL | Status: AC
  Administered 2023-12-14: 15 mL via ORAL
  Filled 2023-12-14: qty 15

## 2023-12-14 MED ORDER — ONDANSETRON HCL 4 MG/2ML IJ SOLN
4.0000 mg | Freq: Once | INTRAMUSCULAR | Status: AC
  Administered 2023-12-14: 4 mg via INTRAVENOUS
  Filled 2023-12-14: qty 2

## 2023-12-14 NOTE — ED Provider Notes (Signed)
 [name] [facility]MENT AT [facility] Provider Note   CSN: [id number] Arrival date & time: 12/13/23  2125     Patient presents with: Abdominal Pain and Chest Pain   Christopher Forbes is a 23 y.o. male with past medical history significant for anxiety, previous overdose, depression, GERD, asthma who presents concern for nausea, vomiting, chest pain for around 3 hours prior to arrival when patient arrived at 9 PM last night.  Patient reports that he has had persistent nausea, vomiting, abdominal pain since today.  He denies any blood in his vomit.  Drug screen was performed by triage staff, and positive for opiates, I asked patient about his drug use and he denied any opiate use, reporting that he does smoke some marijuana but has no idea how he could be positive for opiates.  He denies any fever, chills.  Chest pain is worse with vomiting, nonpleuritic, nonexertional.  No previous history of high blood pressure, high cholesterol, diabetes.    Abdominal Pain Associated symptoms: chest pain   Chest Pain Associated symptoms: abdominal pain        Prior to Admission medications   Medication Sig Start Date End Date Taking? Authorizing Provider  ondansetron  (ZOFRAN -ODT) 4 MG disintegrating tablet Take 1 tablet (4 mg total) by mouth every 6 (six) hours as needed for nausea or vomiting. 12/14/23  Yes Jolane Bankhead H, PA-C  albuterol  (VENTOLIN  HFA) 108 (90 Base) MCG/ACT inhaler Inhale 2 puffs into the lungs every 4 (four) hours as needed for wheezing or shortness of breath. 09/03/22   Cheryle Page, MD  buPROPion  (WELLBUTRIN  XL) 300 MG 24 hr tablet Take 1 tablet (300 mg total) by mouth daily. 06/25/23   Tingling, Corean, PA-C  buPROPion  (WELLBUTRIN  XL) 300 MG 24 hr tablet Take 1 tablet (300 mg total) by mouth daily. APPT NEEDED FOR REFILLS. 08/07/23   Alvia Bring, DO  escitalopram  (LEXAPRO ) 20 MG tablet Take 1 tablet (20 mg total) by mouth daily. APPT NEEDED FOR REFILLS  08/07/23   Alvia Bring, DO  hydrOXYzine  (ATARAX ) 25 MG tablet Take 1 tablet (25 mg total) by mouth 3 (three) times daily as needed for anxiety. 06/25/23   Tingling, Corean, PA-C  omeprazole  (PRILOSEC) 40 MG capsule Take 1 capsule (40 mg total) by mouth in the morning and at bedtime. 08/07/23   Alvia Bring, DO  traZODone  (DESYREL ) 50 MG tablet Take 1 tablet (50 mg total) by mouth at bedtime as needed for sleep. 06/25/23   Tingling, Corean, PA-C    Allergies: Chicken allergy, Milk-related compounds, and Egg-derived products    Review of Systems  Cardiovascular:  Positive for chest pain.  Gastrointestinal:  Positive for abdominal pain.  All other systems reviewed and are negative.   Updated Vital Signs BP 109/79   Pulse 87   Temp 98.5 F (36.9 C) (Oral)   Resp 20   Ht 5' 10 (1.778 m)   Wt 72.1 kg   SpO2 100%   BMI 22.81 kg/m   Physical Exam Vitals and nursing note reviewed.  Constitutional:      General: He is not in acute distress.    Appearance: Normal appearance.     Comments: Generally disheveled, nontoxic, nonseptic appearing  HENT:     Head: Normocephalic and atraumatic.  Eyes:     General:        Right eye: No discharge.        Left eye: No discharge.  Cardiovascular:     Rate and  Rhythm: Regular rhythm. Tachycardia present.     Heart sounds: No murmur heard.    No friction rub. No gallop.  Pulmonary:     Effort: Pulmonary effort is normal.     Breath sounds: Normal breath sounds.  Abdominal:     General: Bowel sounds are normal.     Palpations: Abdomen is soft.     Comments: Mild tenderness to palpation epigastric region, no rebound, rigidity, guarding  Skin:    General: Skin is warm and dry.     Capillary Refill: Capillary refill takes less than 2 seconds.  Neurological:     Mental Status: He is alert and oriented to person, place, and time.  Psychiatric:        Mood and Affect: Mood normal.        Behavior: Behavior normal.     (all labs  ordered are listed, but only abnormal results are displayed) Labs Reviewed  CBC WITH DIFFERENTIAL/PLATELET - Abnormal; Notable for the following components:      Result Value   Lymphs Abs 0.4 (*)    All other components within normal limits  COMPREHENSIVE METABOLIC PANEL WITH GFR - Abnormal; Notable for the following components:   CO2 21 (*)    Glucose, Bld 112 (*)    All other components within normal limits  RAPID URINE DRUG SCREEN, HOSP PERFORMED - Abnormal; Notable for the following components:   Opiates POSITIVE (*)    All other components within normal limits  I-STAT CHEM 8, ED - Abnormal; Notable for the following components:   Glucose, Bld 110 (*)    Calcium, Ion 1.12 (*)    All other components within normal limits  LIPASE, BLOOD  URINALYSIS, ROUTINE W REFLEX MICROSCOPIC  TROPONIN I (HIGH SENSITIVITY)    EKG: None  Radiology: No results found.   Procedures   Medications Ordered in the ED  sodium chloride  0.9 % bolus 1,000 mL (0 mLs Intravenous Stopped 12/14/23 0837)  ondansetron  (ZOFRAN ) injection 4 mg (4 mg Intravenous Given 12/14/23 0748)  alum & mag hydroxide-simeth (MAALOX/MYLANTA) 200-200-20 MG/5ML suspension 30 mL (30 mLs Oral Given 12/14/23 0748)    And  lidocaine  (XYLOCAINE ) 2 % viscous mouth solution 15 mL (15 mLs Oral Given 12/14/23 0748)                                    Medical Decision Making Amount and/or Complexity of Data Reviewed Radiology: ordered.  Risk Prescription drug management.   This patient is a 23 y.o. male  who presents to the ED for concern of chest pain, abdominal pain.   Differential diagnoses prior to evaluation: The emergent differential diagnosis includes, but is not limited to,  ACS, AAS, PE, Mallory-Weiss, Boerhaave's, Pneumonia, acute bronchitis, asthma or COPD exacerbation, anxiety, MSK pain or traumatic injury to the chest, acid reflux versus, The causes of generalized abdominal pain include but are not limited to,  mesenteric ischemia, appendicitis, diverticulitis, DKA, gastritis, gastroenteritis, nephrolithiasis, pancreatitis, peritonitis, adrenal insufficiency,lead poisoning, iron toxicity, intestinal ischemia, constipation, UTI,SBO/LBO, splenic rupture, biliary disease, IBD, IBS, PUD, or hepatitis . This is not an exhaustive differential.   Past Medical History / Co-morbidities / Social History: Asthma, anxiety, depression, previous drug overdose  Additional history: Chart reviewed. Pertinent results include: Reviewed lab work, imaging from previous emergency department visit  Physical Exam: Physical exam performed. The pertinent findings include: Initially somewhat tachycardic, pulse of 125, improved on  reassessment, 102 at 4 AM, after fluids, pulse was 87.  Vital signs otherwise stable.  Afebrile.  Mild tenderness to palpation epigastric region, no rebound, rigidity, guarding.  No wheezing, stridor, rales, rhonchi.  Lab Tests/Imaging studies: I personally interpreted labs/imaging and the pertinent results include: CBC unremarkable, CMP with mild bicarb deficit CO2 21, otherwise unremarkable.  UA unremarkable.  Normal lipase.  His UDS is positive for opiates which he denies.  I independently interpreted plain film chest x-ray which shows no evidence of acute intrathoracic abnormality..  Negative troponin x 1.I agree with the radiologist interpretation.  Cardiac monitoring: EKG obtained and interpreted by myself and attending physician which shows: Sinus tachycardia, improved to NSR on reassessment   Medications: I ordered medication including fluid bolus, GI cocktail, Zofran .  I have reviewed the patients home medicines and have made adjustments as needed.   Disposition: After consideration of the diagnostic results and the patients response to treatment, I feel that patient feeling improved on reassessment, able to tolerate p.o.   emergency department workup does not suggest an emergent condition  requiring admission or immediate intervention beyond what has been performed at this time. The plan is: as above. The patient is safe for discharge and has been instructed to return immediately for worsening symptoms, change in symptoms or any other concerns.  Final diagnoses:  Nausea and vomiting, unspecified vomiting type    ED Discharge Orders          Ordered    ondansetron  (ZOFRAN -ODT) 4 MG disintegrating tablet  Every 6 hours PRN        12/14/23 0854               Jaqualyn Juday, Sherlean DEL, PA-C 12/14/23 9144    Randol Simmonds, MD 12/15/23 [phone]

## 2023-12-14 NOTE — Discharge Instructions (Signed)
 Please drink plenty of fluids including electrolyte containing fluids, such as pedialyte, gatorade to help with dehydration.  You can take the nausea medication I have prescribed up to every 6 hours as needed.

## 2023-12-16 ENCOUNTER — Other Ambulatory Visit (HOSPITAL_COMMUNITY): Payer: Self-pay

## 2023-12-16 ENCOUNTER — Other Ambulatory Visit: Payer: Self-pay

## 2023-12-16 ENCOUNTER — Emergency Department (HOSPITAL_COMMUNITY)
Admission: EM | Admit: 2023-12-16 | Discharge: 2023-12-16 | Disposition: A | Discharge status: Home / Self Care | Payer: MEDICAID | Attending: Emergency Medicine | Admitting: Emergency Medicine

## 2023-12-16 DIAGNOSIS — R112 Nausea with vomiting, unspecified: Secondary | ICD-10-CM | POA: Diagnosis present

## 2023-12-16 DIAGNOSIS — R531 Weakness: Secondary | ICD-10-CM | POA: Insufficient documentation

## 2023-12-16 LAB — COMPREHENSIVE METABOLIC PANEL WITH GFR
ALT: 20 U/L (ref 0–44)
AST: 28 U/L (ref 15–41)
Albumin: 4.8 g/dL (ref 3.5–5.0)
Alkaline Phosphatase: 65 U/L (ref 38–126)
Anion gap: 12 (ref 5–15)
BUN: 8 mg/dL (ref 6–20)
CO2: 24 mmol/L (ref 22–32)
Calcium: 9.7 mg/dL (ref 8.9–10.3)
Chloride: 104 mmol/L (ref 98–111)
Creatinine, Ser: 0.77 mg/dL (ref 0.61–1.24)
GFR, Estimated: >60 mL/min (ref >60)
Glucose, Bld: 94 mg/dL (ref 70–99)
Potassium: 4.2 mmol/L (ref 3.5–5.1)
Sodium: 141 mmol/L (ref 135–145)
Total Bilirubin: 0.6 mg/dL (ref 0.0–1.2)
Total Protein: 7.6 g/dL (ref 6.5–8.1)

## 2023-12-16 LAB — URINE DRUG SCREEN
Amphetamines: NEGATIVE
Barbiturates: NEGATIVE
Benzodiazepines: NEGATIVE
Cocaine: NEGATIVE
Fentanyl: NEGATIVE
Methadone Scn, Ur: NEGATIVE
Opiates: NEGATIVE
Tetrahydrocannabinol: NEGATIVE

## 2023-12-16 LAB — URINALYSIS, ROUTINE W REFLEX MICROSCOPIC
Bilirubin Urine: NEGATIVE
Glucose, UA: NEGATIVE mg/dL
Hgb urine dipstick: NEGATIVE
Ketones, ur: NEGATIVE mg/dL
Leukocytes,Ua: NEGATIVE
Nitrite: NEGATIVE
Protein, ur: NEGATIVE mg/dL
Specific Gravity, Urine: 1.003 — ABNORMAL LOW (ref 1.005–1.030)
pH: 8 (ref 5.0–8.0)

## 2023-12-16 LAB — CBC
HCT: 47.8 % (ref 39.0–52.0)
Hemoglobin: 15.4 g/dL (ref 13.0–17.0)
MCH: 29.7 pg (ref 26.0–34.0)
MCHC: 32.2 g/dL (ref 30.0–36.0)
MCV: 92.1 fL (ref 80.0–100.0)
Platelets: 202 K/uL (ref 150–400)
RBC: 5.19 MIL/uL (ref 4.22–5.81)
RDW: 11.9 % (ref 11.5–15.5)
WBC: 4.8 K/uL (ref 4.0–10.5)
nRBC: 0 % (ref 0.0–0.2)

## 2023-12-16 MED ORDER — ONDANSETRON 8 MG PO TBDP
8.0000 mg | ORAL_TABLET | Freq: Once | ORAL | Status: AC
  Administered 2023-12-16: 8 mg via ORAL
  Filled 2023-12-16: qty 1

## 2023-12-16 NOTE — ED Provider Notes (Signed)
 [place] EMERGENCY DEPARTMENT AT [facility] Provider Note   CSN: [id number] Arrival date & time: 12/16/23  1339     Patient presents with: No chief complaint on file.   Christopher Forbes is a 23 y.o. male.   HPI   Patient has history of anxiety, headaches depression, acid reflux, housing insecurity.  Patient states he has been having some trouble with weakness nausea vomiting over the last several days.  He was seen in the emergency room on September 26.  Patient had a drug screen at that time with that was positive for opiates.  Patient states he does not take opiates and someone dosed him.  Patient is concerned that he is having residual symptoms associated with that.  Patient denies any diarrhea.  No sore throat.  He had an episode this morning where he ate and felt fine.  However in the afternoon then he had sausage biscuit oatmeal.  Patient started having nausea and vomiting.  He felt weak all over.  He had tingling all over.  Patient symptoms have subsequently resolved.  Prior to Admission medications   Medication Sig Start Date End Date Taking? Authorizing Provider  albuterol  (VENTOLIN  HFA) 108 (90 Base) MCG/ACT inhaler Inhale 2 puffs into the lungs every 4 (four) hours as needed for wheezing or shortness of breath. 09/03/22   Cheryle Page, MD  buPROPion  (WELLBUTRIN  XL) 300 MG 24 hr tablet Take 1 tablet (300 mg total) by mouth daily. 06/25/23   Tingling, Corean, PA-C  buPROPion  (WELLBUTRIN  XL) 300 MG 24 hr tablet Take 1 tablet (300 mg total) by mouth daily. APPT NEEDED FOR REFILLS. 08/07/23   Alvia Bring, DO  escitalopram  (LEXAPRO ) 20 MG tablet Take 1 tablet (20 mg total) by mouth daily. APPT NEEDED FOR REFILLS 08/07/23   Alvia Bring, DO  hydrOXYzine  (ATARAX ) 25 MG tablet Take 1 tablet (25 mg total) by mouth 3 (three) times daily as needed for anxiety. 06/25/23   Tingling, Corean, PA-C  omeprazole  (PRILOSEC) 40 MG capsule Take 1 capsule (40 mg total) by mouth in  the morning and at bedtime. 08/07/23   Alvia Bring, DO  ondansetron  (ZOFRAN -ODT) 4 MG disintegrating tablet Take 1 tablet (4 mg total) by mouth every 6 (six) hours as needed for nausea or vomiting. 12/14/23   Prosperi, Christian H, PA-C  traZODone  (DESYREL ) 50 MG tablet Take 1 tablet (50 mg total) by mouth at bedtime as needed for sleep. 06/25/23   Tingling, Corean, PA-C    Allergies: Chicken allergy, Milk-related compounds, and Egg-derived products    Review of Systems  Updated Vital Signs BP 110/76   Pulse 77   Temp 98.6 F (37 C)   Resp 16   SpO2 100%   Physical Exam Vitals and nursing note reviewed.  Constitutional:      General: He is not in acute distress.    Appearance: He is well-developed.  HENT:     Head: Normocephalic and atraumatic.     Right Ear: External ear normal.     Left Ear: External ear normal.  Eyes:     General: No scleral icterus.       Right eye: No discharge.        Left eye: No discharge.     Conjunctiva/sclera: Conjunctivae normal.  Neck:     Trachea: No tracheal deviation.  Cardiovascular:     Rate and Rhythm: Normal rate and regular rhythm.  Pulmonary:     Effort: Pulmonary effort is normal. No respiratory  distress.     Breath sounds: Normal breath sounds. No stridor. No wheezing or rales.  Abdominal:     General: Bowel sounds are normal. There is no distension.     Palpations: Abdomen is soft.     Tenderness: There is no abdominal tenderness. There is no guarding or rebound.  Musculoskeletal:        General: No tenderness or deformity.     Cervical back: Neck supple.  Skin:    General: Skin is warm and dry.     Findings: No rash.  Neurological:     General: No focal deficit present.     Mental Status: He is alert.     Cranial Nerves: No cranial nerve deficit, dysarthria or facial asymmetry.     Sensory: No sensory deficit.     Motor: No abnormal muscle tone or seizure activity.     Coordination: Coordination normal.  Psychiatric:         Mood and Affect: Mood normal.     (all labs ordered are listed, but only abnormal results are displayed) Labs Reviewed  URINALYSIS, ROUTINE W REFLEX MICROSCOPIC - Abnormal; Notable for the following components:      Result Value   Color, Urine STRAW (*)    Specific Gravity, Urine 1.003 (*)    All other components within normal limits  COMPREHENSIVE METABOLIC PANEL WITH GFR  CBC  URINE DRUG SCREEN    EKG: None  Radiology: No results found.   Procedures   Medications Ordered in the ED  ondansetron  (ZOFRAN -ODT) disintegrating tablet 8 mg (has no administration in time range)                                    Medical Decision Making Amount and/or Complexity of Data Reviewed Labs: ordered.  Risk Prescription drug management.   As ED workup is reassuring.  He has no abdominal tenderness.  He is not vomiting.  His vitals are normal.  Patient does not have any neurologic deficits.  It is possible the tingling and numbness was related to a vasovagal episode associated with his vomiting.  Patient does not have an evidence of pancreatitis or hepatitis.  I explained to the patient that the opiates are no longer in his system.  His drug screen was negative.  Is possible he has a gastrointestinal viral GI bug.  Evaluation and diagnostic testing in the emergency department does not suggest an emergent condition requiring admission or immediate intervention beyond what has been performed at this time.  The patient is safe for discharge and has been instructed to return immediately for worsening symptoms, change in symptoms or any other concerns.     Final diagnoses:  Nausea and vomiting, unspecified vomiting type    ED Discharge Orders     None          Randol Simmonds, MD 12/16/23 1550

## 2023-12-16 NOTE — Discharge Instructions (Signed)
 Your test today did not show any signs of serious problems.  It is possible you could be recovering from a GI bug.  Avoid any greasy foods or dairy products.  Try slowly advancing your diet with broth, soups, crackers ,toast etc.  Symptoms should improve over the next few days.

## 2023-12-16 NOTE — ED Triage Notes (Addendum)
 Patient BIB EMS c/o weakness x 4 days. Patient report his not been better since he got laced last Friday.  Patient report nausea and vomiting x 3 yesterday. Patient denies abdominal pain, denies Chest pain and SOB.

## 2023-12-24 ENCOUNTER — Ambulatory Visit: Payer: MEDICAID | Admitting: Family Medicine

## 2023-12-24 ENCOUNTER — Encounter: Payer: Self-pay | Admitting: Family Medicine

## 2023-12-24 DIAGNOSIS — R0602 Shortness of breath: Secondary | ICD-10-CM

## 2023-12-24 DIAGNOSIS — R071 Chest pain on breathing: Secondary | ICD-10-CM

## 2023-12-24 NOTE — Progress Notes (Signed)
 Pt came in with a complaint of a bee sting to their posterior forearm. Student nurse Beau is unsure if it was a bee, but did notice a  small laceration on there posterior forearm. On assessment, student nurse Snell noticed the area had redness and swelling a inch and a half wide. Pt rated the pain a 8 on the pain scale to touch. Student nurse Beau provided hydrocortisone to the area and benadryl  25 mg to take with pt. Pt was educated on how to apply the hydrocortisone and when to take the benadryl .

## 2023-12-26 ENCOUNTER — Other Ambulatory Visit (HOSPITAL_COMMUNITY): Payer: Self-pay

## 2024-01-07 NOTE — Progress Notes (Addendum)
 The patient attended a screening event on 11/25/2023 where his BP screening results was 110/62. At the event the patient noted he has [employer] and pt does smoke tobacco. At the screening event pt indicated having food, housing SDOH insecurities. Pt did not list pcp. Per chart review there are no past or future visits within the past 12 months for pt. No additional Health equity team support indicated at this time.

## 2024-01-12 ENCOUNTER — Ambulatory Visit (HOSPITAL_COMMUNITY)
Admission: EM | Admit: 2024-01-12 | Discharge: 2024-01-12 | Disposition: A | Discharge status: Other Acute Inpatient Hospital | Payer: MEDICAID | Attending: Family | Admitting: Family

## 2024-01-12 ENCOUNTER — Encounter: Payer: Self-pay | Admitting: Family

## 2024-01-12 ENCOUNTER — Other Ambulatory Visit: Payer: Self-pay

## 2024-01-12 ENCOUNTER — Inpatient Hospital Stay
Admission: AD | Admit: 2024-01-12 | Discharge: 2024-01-23 | DRG: 885 | Disposition: A | Discharge status: Home / Self Care | Payer: MEDICAID | Source: Intra-hospital | Attending: Psychiatry | Admitting: Psychiatry

## 2024-01-12 DIAGNOSIS — F411 Generalized anxiety disorder: Secondary | ICD-10-CM | POA: Insufficient documentation

## 2024-01-12 DIAGNOSIS — R45851 Suicidal ideations: Secondary | ICD-10-CM | POA: Insufficient documentation

## 2024-01-12 DIAGNOSIS — Z79899 Other long term (current) drug therapy: Secondary | ICD-10-CM

## 2024-01-12 DIAGNOSIS — F819 Developmental disorder of scholastic skills, unspecified: Secondary | ICD-10-CM | POA: Insufficient documentation

## 2024-01-12 DIAGNOSIS — Z62811 Personal history of psychological abuse in childhood: Secondary | ICD-10-CM

## 2024-01-12 DIAGNOSIS — F332 Major depressive disorder, recurrent severe without psychotic features: Secondary | ICD-10-CM | POA: Insufficient documentation

## 2024-01-12 DIAGNOSIS — Z555 Less than a high school diploma: Secondary | ICD-10-CM

## 2024-01-12 DIAGNOSIS — Z83438 Family history of other disorder of lipoprotein metabolism and other lipidemia: Secondary | ICD-10-CM

## 2024-01-12 DIAGNOSIS — Z59868 Other specified financial insecurity: Secondary | ICD-10-CM | POA: Diagnosis not present

## 2024-01-12 DIAGNOSIS — F1290 Cannabis use, unspecified, uncomplicated: Secondary | ICD-10-CM | POA: Insufficient documentation

## 2024-01-12 DIAGNOSIS — Z8249 Family history of ischemic heart disease and other diseases of the circulatory system: Secondary | ICD-10-CM | POA: Diagnosis not present

## 2024-01-12 DIAGNOSIS — F333 Major depressive disorder, recurrent, severe with psychotic symptoms: Secondary | ICD-10-CM | POA: Diagnosis not present

## 2024-01-12 DIAGNOSIS — Z62810 Personal history of physical and sexual abuse in childhood: Secondary | ICD-10-CM

## 2024-01-12 DIAGNOSIS — Z91012 Allergy to eggs, unspecified: Secondary | ICD-10-CM | POA: Diagnosis not present

## 2024-01-12 DIAGNOSIS — Z825 Family history of asthma and other chronic lower respiratory diseases: Secondary | ICD-10-CM | POA: Diagnosis not present

## 2024-01-12 DIAGNOSIS — F410 Panic disorder [episodic paroxysmal anxiety] without agoraphobia: Secondary | ICD-10-CM | POA: Diagnosis present

## 2024-01-12 DIAGNOSIS — Z91011 Allergy to milk products, unspecified: Secondary | ICD-10-CM

## 2024-01-12 DIAGNOSIS — J45909 Unspecified asthma, uncomplicated: Secondary | ICD-10-CM | POA: Diagnosis present

## 2024-01-12 DIAGNOSIS — F17210 Nicotine dependence, cigarettes, uncomplicated: Secondary | ICD-10-CM | POA: Diagnosis present

## 2024-01-12 DIAGNOSIS — Z5900 Homelessness unspecified: Secondary | ICD-10-CM

## 2024-01-12 DIAGNOSIS — R0602 Shortness of breath: Secondary | ICD-10-CM

## 2024-01-12 DIAGNOSIS — Z91018 Allergy to other foods: Secondary | ICD-10-CM | POA: Diagnosis not present

## 2024-01-12 DIAGNOSIS — Z9151 Personal history of suicidal behavior: Secondary | ICD-10-CM | POA: Diagnosis not present

## 2024-01-12 DIAGNOSIS — Z818 Family history of other mental and behavioral disorders: Secondary | ICD-10-CM

## 2024-01-12 DIAGNOSIS — M130 Polyarthritis, unspecified: Secondary | ICD-10-CM | POA: Insufficient documentation

## 2024-01-12 DIAGNOSIS — K219 Gastro-esophageal reflux disease without esophagitis: Secondary | ICD-10-CM | POA: Diagnosis present

## 2024-01-12 DIAGNOSIS — Z833 Family history of diabetes mellitus: Secondary | ICD-10-CM

## 2024-01-12 DIAGNOSIS — Z823 Family history of stroke: Secondary | ICD-10-CM

## 2024-01-12 DIAGNOSIS — Z5971 Insufficient health insurance coverage: Secondary | ICD-10-CM | POA: Insufficient documentation

## 2024-01-12 DIAGNOSIS — G8929 Other chronic pain: Secondary | ICD-10-CM | POA: Insufficient documentation

## 2024-01-12 DIAGNOSIS — R071 Chest pain on breathing: Secondary | ICD-10-CM

## 2024-01-12 LAB — CBC WITH DIFFERENTIAL/PLATELET
Abs Immature Granulocytes: 0.02 K/uL (ref 0.00–0.07)
Basophils Absolute: 0 K/uL (ref 0.0–0.1)
Basophils Relative: 1 %
Eosinophils Absolute: 0 K/uL (ref 0.0–0.5)
Eosinophils Relative: 0 %
HCT: 51.3 % (ref 39.0–52.0)
Hemoglobin: 17.1 g/dL — ABNORMAL HIGH (ref 13.0–17.0)
Immature Granulocytes: 0 %
Lymphocytes Relative: 17 %
Lymphs Abs: 1.4 K/uL (ref 0.7–4.0)
MCH: 30.2 pg (ref 26.0–34.0)
MCHC: 33.3 g/dL (ref 30.0–36.0)
MCV: 90.5 fL (ref 80.0–100.0)
Monocytes Absolute: 0.4 K/uL (ref 0.1–1.0)
Monocytes Relative: 5 %
Neutro Abs: 6.1 K/uL (ref 1.7–7.7)
Neutrophils Relative %: 77 %
Platelets: 245 K/uL (ref 150–400)
RBC: 5.67 MIL/uL (ref 4.22–5.81)
RDW: 12.1 % (ref 11.5–15.5)
WBC: 8 K/uL (ref 4.0–10.5)
nRBC: 0 % (ref 0.0–0.2)

## 2024-01-12 LAB — COMPREHENSIVE METABOLIC PANEL WITH GFR
ALT: 17 U/L (ref 0–44)
AST: 20 U/L (ref 15–41)
Albumin: 5.1 g/dL — ABNORMAL HIGH (ref 3.5–5.0)
Alkaline Phosphatase: 60 U/L (ref 38–126)
Anion gap: 12 (ref 5–15)
BUN: 7 mg/dL (ref 6–20)
CO2: 26 mmol/L (ref 22–32)
Calcium: 10.2 mg/dL (ref 8.9–10.3)
Chloride: 101 mmol/L (ref 98–111)
Creatinine, Ser: 0.93 mg/dL (ref 0.61–1.24)
GFR, Estimated: >60 mL/min (ref >60)
Glucose, Bld: 77 mg/dL (ref 70–99)
Potassium: 4.2 mmol/L (ref 3.5–5.1)
Sodium: 139 mmol/L (ref 135–145)
Total Bilirubin: 0.9 mg/dL (ref 0.0–1.2)
Total Protein: 8.3 g/dL — ABNORMAL HIGH (ref 6.5–8.1)

## 2024-01-12 LAB — POCT URINE DRUG SCREEN - MANUAL ENTRY (I-SCREEN)
POC Amphetamine UR: NOT DETECTED
POC Buprenorphine (BUP): NOT DETECTED
POC Cocaine UR: NOT DETECTED
POC Marijuana UR: POSITIVE — AB
POC Methadone UR: NOT DETECTED
POC Methamphetamine UR: NOT DETECTED
POC Morphine: NOT DETECTED
POC Oxazepam (BZO): NOT DETECTED
POC Oxycodone UR: NOT DETECTED
POC Secobarbital (BAR): NOT DETECTED

## 2024-01-12 LAB — ETHANOL: Alcohol, Ethyl (B): <15 mg/dL (ref <15)

## 2024-01-12 LAB — MAGNESIUM: Magnesium: 2.1 mg/dL (ref 1.7–2.4)

## 2024-01-12 LAB — TSH: TSH: 1.628 u[IU]/mL (ref 0.350–4.500)

## 2024-01-12 MED ORDER — ACETAMINOPHEN 325 MG PO TABS: 650.0000 mg | ORAL_TABLET | Freq: Four times a day (QID) | ORAL | Status: DC | PRN

## 2024-01-12 MED ORDER — LORAZEPAM 2 MG/ML IJ SOLN: 2.0000 mg | Freq: Three times a day (TID) | INTRAMUSCULAR | Status: DC | PRN

## 2024-01-12 MED ORDER — HALOPERIDOL LACTATE 5 MG/ML IJ SOLN: 5.0000 mg | Freq: Three times a day (TID) | INTRAMUSCULAR | Status: DC | PRN

## 2024-01-12 MED ORDER — ESCITALOPRAM OXALATE 10 MG PO TABS
10.0000 mg | ORAL_TABLET | Freq: Every day | ORAL | Status: DC
  Administered 2024-01-13 – 2024-01-14 (×2): 10 mg via ORAL
  Filled 2024-01-12 (×2): qty 1

## 2024-01-12 MED ORDER — DIPHENHYDRAMINE HCL 50 MG/ML IJ SOLN: 50.0000 mg | Freq: Three times a day (TID) | INTRAMUSCULAR | Status: DC | PRN

## 2024-01-12 MED ORDER — HALOPERIDOL LACTATE 5 MG/ML IJ SOLN: 10.0000 mg | Freq: Three times a day (TID) | INTRAMUSCULAR | Status: DC | PRN

## 2024-01-12 MED ORDER — MAGNESIUM HYDROXIDE 400 MG/5ML PO SUSP: 30.0000 mL | Freq: Every day | ORAL | Status: DC | PRN

## 2024-01-12 MED ORDER — HYDROXYZINE HCL 25 MG PO TABS
25.0000 mg | ORAL_TABLET | Freq: Three times a day (TID) | ORAL | Status: DC | PRN
  Administered 2024-01-12 – 2024-01-15 (×2): 25 mg via ORAL
  Filled 2024-01-12 (×2): qty 1

## 2024-01-12 MED ORDER — DIPHENHYDRAMINE HCL 50 MG PO CAPS: 50.0000 mg | ORAL_CAPSULE | Freq: Three times a day (TID) | ORAL | Status: DC | PRN

## 2024-01-12 MED ORDER — ACETAMINOPHEN 325 MG PO TABS
650.0000 mg | ORAL_TABLET | Freq: Four times a day (QID) | ORAL | Status: DC | PRN
  Administered 2024-01-13 – 2024-01-17 (×3): 650 mg via ORAL
  Filled 2024-01-12 (×3): qty 2

## 2024-01-12 MED ORDER — HYDROXYZINE HCL 25 MG PO TABS: 25.0000 mg | ORAL_TABLET | Freq: Three times a day (TID) | ORAL | Status: DC | PRN

## 2024-01-12 MED ORDER — HALOPERIDOL 5 MG PO TABS: 5.0000 mg | ORAL_TABLET | Freq: Three times a day (TID) | ORAL | Status: DC | PRN

## 2024-01-12 MED ORDER — TRAZODONE HCL 50 MG PO TABS
50.0000 mg | ORAL_TABLET | Freq: Every evening | ORAL | Status: DC | PRN
  Administered 2024-01-12 – 2024-01-20 (×8): 50 mg via ORAL
  Filled 2024-01-12 (×7): qty 1

## 2024-01-12 MED ORDER — TRAZODONE HCL 50 MG PO TABS: 50.0000 mg | ORAL_TABLET | Freq: Every evening | ORAL | Status: DC | PRN

## 2024-01-12 MED ORDER — DIPHENHYDRAMINE HCL 25 MG PO CAPS: 50.0000 mg | ORAL_CAPSULE | Freq: Three times a day (TID) | ORAL | Status: DC | PRN

## 2024-01-12 MED ORDER — ALUM & MAG HYDROXIDE-SIMETH 200-200-20 MG/5ML PO SUSP: 30.0000 mL | ORAL | Status: DC | PRN

## 2024-01-12 MED ORDER — ESCITALOPRAM OXALATE 10 MG PO TABS: 10.0000 mg | ORAL_TABLET | Freq: Every day | ORAL | Status: DC

## 2024-01-12 NOTE — Tx Team (Signed)
 Initial Treatment Plan 01/12/2024 7:02 PM Christopher Forbes FMW:[id number]    PATIENT STRESSORS: Financial difficulties   Marital or family conflict     PATIENT STRENGTHS: Other: patient declines to answer   PATIENT IDENTIFIED PROBLEMS: Depression                     DISCHARGE CRITERIA:  Adequate post-discharge living arrangements Improved stabilization in mood, thinking, and/or behavior  PRELIMINARY DISCHARGE PLAN: Outpatient therapy  PATIENT/FAMILY INVOLVEMENT: This treatment plan has been presented to and reviewed with the patient, Christopher Forbes.  The patient has been given the opportunity to ask questions and make suggestions.  Titus JONETTA Bare, RN 01/12/2024, 7:02 PM
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
## 2024-01-12 NOTE — ED Provider Notes (Signed)
 [facility] Continuous Assessment Admission H&P  Date: 01/12/24 Patient Name: Christopher Forbes MRN: [id number] Chief Complaint: suicidal ideation  Diagnoses:  Final diagnoses:  MDD (major depressive disorder), recurrent severe, without psychosis (HCC)    HPI: Patient presents voluntarily to [facility] behavioral health for walk-in assessment.  Patient endorses suicidal ideation with a plan to slit wrist or jump from a bridge. Patient shares that he had a knife but gave it to a friend for safekeeping.  Patient reports suicidal ideation worsening x 4 days.  Patient is unable to contract verbally for safety at this time.  Recent stressors include a break-up with his fiance 5 days ago and my mom has refused to see me.  Patient is assessed by this nurse practitioner face-to-face.  He is seated in assessment area, no apparent distress.  He is alert and oriented, pleasant and cooperative during assessment.  Patient is dressed appropriately for the weather, malodorous.  He presents with depressed mood.  Christopher Forbes endorses history of 2 previous suicide attempts, most recent suicide attempt in May 2024 when he ingested an intentional overdose.  He denies homicidal ideation.  He denies auditory and visual hallucinations currently.  Reports has seen black blurs or black shadowy figures for several months.  There is no evidence of delusional thought content and no indication that patient is responding to internal stimuli currently.  Patient endorses alcohol use approximately 3 times per week drinks an average of 3 drinks.  He also uses marijuana on average of 3 times per week.  Patient endorses history of major depressive disorder is not currently followed by outpatient psychiatry because nobody takes my insurance.  Patient reports stopped medications to address mood about 6 months ago after he could not afford his medications.  He endorses history of 4 previous inpatient psychiatric  hospitalizations.  Most recent inpatient psychiatric hospitalization April 2025 when he was admitted to [facility] for inpatient psychiatric treatment.  Patient is currently homeless in [place].  He denies access to weapons.  He is not employed, has been helping out a friend.  He endorses average sleep and decreased appetite.  Patient reports plan to move to [place]  with a friend in approximately 2 weeks.  Reports moving to [place]  for a place to stay in a job opportunity.  Patient offered support and encouragement.  Patient agrees with treatment plan to include inpatient psychiatric admission.  Total Time spent with patient: 45 minutes  Musculoskeletal  Strength & Muscle Tone: within normal limits Gait & Station: normal Patient leans: N/A  Psychiatric Specialty Exam  Presentation General Appearance:  Other (comment); Disheveled (malodorous)  Eye Contact: Good  Speech: Clear and Coherent; Normal Rate  Speech Volume: Normal  Handedness: Right   Mood and Affect  Mood: Depressed  Affect: Depressed   Thought Process  Thought Processes: Coherent; Goal Directed; Linear  Descriptions of Associations:Intact  Orientation:Full (Time, Place and Person)  Thought Content:Logical    Hallucinations:Hallucinations: None  Ideas of Reference:None  Suicidal Thoughts:Suicidal Thoughts: Yes, Active SI Active Intent and/or Plan: With Plan  Homicidal Thoughts:Homicidal Thoughts: No   Sensorium  Memory: Immediate Good; Recent Good  Judgment: Fair  Insight: Present   Executive Functions  Concentration: Good  Attention Span: Good  Recall: Good  Fund of Knowledge: Fair  Language: Fair   Psychomotor Activity  Psychomotor Activity: Psychomotor Activity: Normal   Assets  Assets: Communication Skills; Desire for Improvement; Physical Health; Social Support; Resilience; Vocational/Educational; Financial Resources/Insurance   Sleep   Sleep:  Sleep: Good Number of Hours of Sleep: 8   Nutritional Assessment (For OBS and FBC admissions only) Has the patient had a weight loss or gain of 10 pounds or more in the last 3 months?: No Has the patient had a decrease in food intake/or appetite?: No Does the patient have dental problems?: No Does the patient have eating habits or behaviors that may be indicators of an eating disorder including binging or inducing vomiting?: No Has the patient recently lost weight without trying?: 0 Has the patient been eating poorly because of a decreased appetite?: 0 Malnutrition Screening Tool Score: 0    Physical Exam Vitals and nursing note reviewed.  Constitutional:      Appearance: Normal appearance. He is well-developed.  HENT:     Head: Normocephalic and atraumatic.     Nose: Nose normal.  Cardiovascular:     Rate and Rhythm: Normal rate.  Pulmonary:     Effort: Pulmonary effort is normal.  Musculoskeletal:        General: Normal range of motion.     Cervical back: Normal range of motion.  Skin:    General: Skin is warm and dry.  Neurological:     Mental Status: He is alert and oriented to person, place, and time.  Psychiatric:        Attention and Perception: Attention and perception normal.        Mood and Affect: Affect normal. Mood is depressed.        Speech: Speech normal.        Behavior: Behavior normal. Behavior is cooperative.        Thought Content: Thought content includes suicidal ideation. Thought content includes suicidal plan.        Cognition and Memory: Cognition and memory normal.        Judgment: Judgment normal.    Review of Systems  Constitutional: Negative.   HENT: Negative.    Eyes: Negative.   Respiratory: Negative.    Cardiovascular: Negative.   Gastrointestinal: Negative.   Genitourinary: Negative.   Musculoskeletal: Negative.   Skin: Negative.   Neurological: Negative.   Psychiatric/Behavioral:  Positive for depression and suicidal  ideas.     Blood pressure 108/77, pulse 87, temperature 99.4 F (37.4 C), temperature source Temporal, resp. rate 16, SpO2 100%. There is no height or weight on file to calculate BMI.  Past Psychiatric History: Major depressive disorder, anxiety state  Is the patient at risk to self? Yes  Has the patient been a risk to self in the past 6 months? Yes .    Has the patient been a risk to self within the distant past? Yes   Is the patient a risk to others? No   Has the patient been a risk to others in the past 6 months? No   Has the patient been a risk to others within the distant past? No   Past Medical History: Back pain, chronic pain of left knee, elevated TSH, polyarthritis with positive rheumatoid factor, chest pain, frequent headaches, learning disability, hypokalemia  Family History: Mother: Major depressive disorder, generalized anxiety disorder.  Father: Schizophrenia.  Brother: Bipolar disorder.  Social History: Patient is currently homeless reports using alcohol average of 3 times per week no more than 3 drinks, uses THC 3 times per week, currently unemployed.  Last Labs:  Admission on 12/16/2023, Discharged on 12/16/2023  Component Date Value Ref Range Status   Sodium 12/16/2023 141  135 - 145 mmol/L Final  Potassium 12/16/2023 4.2  3.5 - 5.1 mmol/L Final   Chloride 12/16/2023 104  98 - 111 mmol/L Final   CO2 12/16/2023 24  22 - 32 mmol/L Final   Glucose, Bld 12/16/2023 94  70 - 99 mg/dL Final   Glucose reference range applies only to samples taken after fasting for at least 8 hours.   BUN 12/16/2023 8  6 - 20 mg/dL Final   Creatinine, Ser 12/16/2023 0.77  0.61 - 1.24 mg/dL Final   Calcium 90/70/7974 9.7  8.9 - 10.3 mg/dL Final   Total Protein 90/70/7974 7.6  6.5 - 8.1 g/dL Final   Albumin 90/70/7974 4.8  3.5 - 5.0 g/dL Final   AST 90/70/7974 28  15 - 41 U/L Final   ALT 12/16/2023 20  0 - 44 U/L Final   Alkaline Phosphatase 12/16/2023 65  38 - 126 U/L Final   Total  Bilirubin 12/16/2023 0.6  0.0 - 1.2 mg/dL Final   GFR, Estimated 12/16/2023 >60  >60 mL/min Final   Comment: (NOTE) Calculated using the CKD-EPI Creatinine Equation (2021)    Anion gap 12/16/2023 12  5 - 15 Final   Performed at [facility], [address] [place], [place] [address]   WBC 12/16/2023 4.8  4.0 - 10.5 K/uL Final   RBC 12/16/2023 5.19  4.22 - 5.81 MIL/uL Final   Hemoglobin 12/16/2023 15.4  13.0 - 17.0 g/dL Final   HCT 90/70/7974 47.8  39.0 - 52.0 % Final   MCV 12/16/2023 92.1  80.0 - 100.0 fL Final   MCH 12/16/2023 29.7  26.0 - 34.0 pg Final   MCHC 12/16/2023 32.2  30.0 - 36.0 g/dL Final   RDW 90/70/7974 11.9  11.5 - 15.5 % Final   Platelets 12/16/2023 202  150 - 400 K/uL Final   nRBC 12/16/2023 0.0  0.0 - 0.2 % Final   Performed at [facility], [address], [place], [place] [address]   Color, Urine 12/16/2023 STRAW (A)  YELLOW Final   APPearance 12/16/2023 CLEAR  CLEAR Final   Specific Gravity, Urine 12/16/2023 1.003 (L)  1.005 - 1.030 Final   pH 12/16/2023 8.0  5.0 - 8.0 Final   Glucose, UA 12/16/2023 NEGATIVE  NEGATIVE mg/dL Final   Hgb urine dipstick 12/16/2023 NEGATIVE  NEGATIVE Final   Bilirubin Urine 12/16/2023 NEGATIVE  NEGATIVE Final   Ketones, ur 12/16/2023 NEGATIVE  NEGATIVE mg/dL Final   Protein, ur 90/70/7974 NEGATIVE  NEGATIVE mg/dL Final   Nitrite 90/70/7974 NEGATIVE  NEGATIVE Final   Leukocytes,Ua 12/16/2023 NEGATIVE  NEGATIVE Final   Performed at [facility], [address] [place], [place] [address]   Opiates 12/16/2023 NEGATIVE  NEGATIVE Final   Cocaine 12/16/2023 NEGATIVE  NEGATIVE Final   Benzodiazepines 12/16/2023 NEGATIVE  NEGATIVE Final   Amphetamines 12/16/2023 NEGATIVE  NEGATIVE Final   Tetrahydrocannabinol 12/16/2023 NEGATIVE  NEGATIVE Final   Barbiturates 12/16/2023 NEGATIVE  NEGATIVE Final   Methadone Scn, Ur 12/16/2023 NEGATIVE  NEGATIVE Final   Fentanyl  12/16/2023 NEGATIVE   NEGATIVE Final   Comment: (NOTE) Drug screen is for Medical Purposes only. Positive results are preliminary only. If confirmation is needed, notify lab within 5 days.  Drug Class                 Cutoff (ng/mL) Amphetamine and metabolites 1000 Barbiturate and metabolites 200 Benzodiazepine              200 Opiates and metabolites  300 Cocaine and metabolites     300 THC                         50 Fentanyl                     5 Methadone                   300  Trazodone  is metabolized in vivo to several metabolites,  including pharmacologically active m-CPP, which is excreted in the  urine.  Immunoassay screens for amphetamines and MDMA have potential  cross-reactivity with these compounds and may provide false positive  result.  Performed at [facility], [address], [place], [place] [address]   Admission on 12/13/2023, Discharged on 12/14/2023  Component Date Value Ref Range Status   WBC 12/13/2023 8.1  4.0 - 10.5 K/uL Final   RBC 12/13/2023 5.21  4.22 - 5.81 MIL/uL Final   Hemoglobin 12/13/2023 15.7  13.0 - 17.0 g/dL Final   HCT 90/73/7974 47.6  39.0 - 52.0 % Final   MCV 12/13/2023 91.4  80.0 - 100.0 fL Final   MCH 12/13/2023 30.1  26.0 - 34.0 pg Final   MCHC 12/13/2023 33.0  30.0 - 36.0 g/dL Final   RDW 90/73/7974 11.8  11.5 - 15.5 % Final   Platelets 12/13/2023 197  150 - 400 K/uL Final   nRBC 12/13/2023 0.0  0.0 - 0.2 % Final   Neutrophils Relative % 12/13/2023 89  % Final   Neutro Abs 12/13/2023 7.2  1.7 - 7.7 K/uL Final   Lymphocytes Relative 12/13/2023 5  % Final   Lymphs Abs 12/13/2023 0.4 (L)  0.7 - 4.0 K/uL Final   Monocytes Relative 12/13/2023 5  % Final   Monocytes Absolute 12/13/2023 0.4  0.1 - 1.0 K/uL Final   Eosinophils Relative 12/13/2023 1  % Final   Eosinophils Absolute 12/13/2023 0.0  0.0 - 0.5 K/uL Final   Basophils Relative 12/13/2023 0  % Final   Basophils Absolute 12/13/2023 0.0  0.0 - 0.1 K/uL Final   Immature  Granulocytes 12/13/2023 0  % Final   Abs Immature Granulocytes 12/13/2023 0.02  0.00 - 0.07 K/uL Final   Performed at [facility], [address] [place], [place] [address]   Sodium 12/13/2023 138  135 - 145 mmol/L Final   Potassium 12/13/2023 3.6  3.5 - 5.1 mmol/L Final   Chloride 12/13/2023 105  98 - 111 mmol/L Final   CO2 12/13/2023 21 (L)  22 - 32 mmol/L Final   Glucose, Bld 12/13/2023 112 (H)  70 - 99 mg/dL Final   Glucose reference range applies only to samples taken after fasting for at least 8 hours.   BUN 12/13/2023 14  6 - 20 mg/dL Final   Creatinine, Ser 12/13/2023 0.86  0.61 - 1.24 mg/dL Final   Calcium 90/73/7974 9.3  8.9 - 10.3 mg/dL Final   Total Protein 90/73/7974 7.5  6.5 - 8.1 g/dL Final   Albumin 90/73/7974 4.6  3.5 - 5.0 g/dL Final   AST 90/73/7974 26  15 - 41 U/L Final   ALT 12/13/2023 19  0 - 44 U/L Final   Alkaline Phosphatase 12/13/2023 64  38 - 126 U/L Final   Total Bilirubin 12/13/2023 0.8  0.0 - 1.2 mg/dL Final   GFR, Estimated 12/13/2023 >60  >60 mL/min Final   Comment: (NOTE) Calculated using the CKD-EPI Creatinine Equation (  2021)    Anion gap 12/13/2023 12  5 - 15 Final   Performed at [facility], [address] [place], [place] [address]   Lipase 12/13/2023 29  11 - 51 U/L Final   Performed at [facility], [address] [place], [place] [address]   Sodium 12/13/2023 141  135 - 145 mmol/L Final   Potassium 12/13/2023 3.7  3.5 - 5.1 mmol/L Final   Chloride 12/13/2023 106  98 - 111 mmol/L Final   BUN 12/13/2023 17  6 - 20 mg/dL Final   Creatinine, Ser 12/13/2023 0.80  0.61 - 1.24 mg/dL Final   Glucose, Bld 90/73/7974 110 (H)  70 - 99 mg/dL Final   Glucose reference range applies only to samples taken after fasting for at least 8 hours.   Calcium, Ion 12/13/2023 1.12 (L)  1.15 - 1.40 mmol/L Final   TCO2 12/13/2023 22  22 - 32 mmol/L Final   Hemoglobin 12/13/2023 16.0  13.0 - 17.0 g/dL Final   HCT 90/73/7974 47.0   39.0 - 52.0 % Final   Opiates 12/13/2023 POSITIVE (A)  NONE DETECTED Final   Cocaine 12/13/2023 NONE DETECTED  NONE DETECTED Final   Benzodiazepines 12/13/2023 NONE DETECTED  NONE DETECTED Final   Amphetamines 12/13/2023 NONE DETECTED  NONE DETECTED Final   Tetrahydrocannabinol 12/13/2023 NONE DETECTED  NONE DETECTED Final   Barbiturates 12/13/2023 NONE DETECTED  NONE DETECTED Final   Comment: (NOTE) DRUG SCREEN FOR MEDICAL PURPOSES ONLY.  IF CONFIRMATION IS NEEDED FOR ANY PURPOSE, NOTIFY LAB WITHIN 5 DAYS.  LOWEST DETECTABLE LIMITS FOR URINE DRUG SCREEN Drug Class                     Cutoff (ng/mL) Amphetamine and metabolites    1000 Barbiturate and metabolites    200 Benzodiazepine                 200 Opiates and metabolites        300 Cocaine and metabolites        300 THC                            50 Performed at [facility], [address], [place], [place] [address]    Color, Urine 12/13/2023 YELLOW  YELLOW Final   APPearance 12/13/2023 CLEAR  CLEAR Final   Specific Gravity, Urine 12/13/2023 1.023  1.005 - 1.030 Final   pH 12/13/2023 7.0  5.0 - 8.0 Final   Glucose, UA 12/13/2023 NEGATIVE  NEGATIVE mg/dL Final   Hgb urine dipstick 12/13/2023 NEGATIVE  NEGATIVE Final   Bilirubin Urine 12/13/2023 NEGATIVE  NEGATIVE Final   Ketones, ur 12/13/2023 NEGATIVE  NEGATIVE mg/dL Final   Protein, ur 90/73/7974 NEGATIVE  NEGATIVE mg/dL Final   Nitrite 90/73/7974 NEGATIVE  NEGATIVE Final   Leukocytes,Ua 12/13/2023 NEGATIVE  NEGATIVE Final   RBC / HPF 12/13/2023 6-10  0 - 5 RBC/hpf Final   WBC, UA 12/13/2023 6-10  0 - 5 WBC/hpf Final   Bacteria, UA 12/13/2023 NONE SEEN  NONE SEEN Final   Squamous Epithelial / HPF 12/13/2023 6-10  0 - 5 /HPF Final   Performed at [facility], [address], [place], [place] [address]   Troponin I (High Sensitivity) 12/14/2023 3  <18 ng/L Final   Comment: (NOTE) Elevated high sensitivity troponin I (hsTnI) values and significant   changes across serial measurements may  suggest ACS but many other  chronic and acute conditions are known to elevate hsTnI results.  Refer to the Links section for chest pain algorithms and additional  guidance. Performed at [facility], [address], [place], [place] [address]   Community Documentation on 11/25/2023  Component Date Value Ref Range Status   Glucose 11/25/2023 98   Final  Admission on 11/10/2023, Discharged on 11/10/2023  Component Date Value Ref Range Status   Sodium 11/10/2023 134 (L)  135 - 145 mmol/L Final   Potassium 11/10/2023 3.7  3.5 - 5.1 mmol/L Final   Chloride 11/10/2023 97 (L)  98 - 111 mmol/L Final   CO2 11/10/2023 26  22 - 32 mmol/L Final   Glucose, Bld 11/10/2023 102 (H)  70 - 99 mg/dL Final   Glucose reference range applies only to samples taken after fasting for at least 8 hours.   BUN 11/10/2023 10  6 - 20 mg/dL Final   Creatinine, Ser 11/10/2023 0.94  0.61 - 1.24 mg/dL Final   Calcium 91/75/7974 10.0  8.9 - 10.3 mg/dL Final   GFR, Estimated 11/10/2023 >60  >60 mL/min Final   Comment: (NOTE) Calculated using the CKD-EPI Creatinine Equation (2021)    Anion gap 11/10/2023 11  5 - 15 Final   Performed at [facility], [address], [place], [place] [address]   WBC 11/10/2023 6.5  4.0 - 10.5 K/uL Final   RBC 11/10/2023 5.61  4.22 - 5.81 MIL/uL Final   Hemoglobin 11/10/2023 16.5  13.0 - 17.0 g/dL Final   HCT 91/75/7974 51.1  39.0 - 52.0 % Final   MCV 11/10/2023 91.1  80.0 - 100.0 fL Final   MCH 11/10/2023 29.4  26.0 - 34.0 pg Final   MCHC 11/10/2023 32.3  30.0 - 36.0 g/dL Final   RDW 91/75/7974 11.9  11.5 - 15.5 % Final   Platelets 11/10/2023 259  150 - 400 K/uL Final   nRBC 11/10/2023 0.0  0.0 - 0.2 % Final   Neutrophils Relative % 11/10/2023 59  % Final   Neutro Abs 11/10/2023 3.9  1.7 - 7.7 K/uL Final   Lymphocytes Relative 11/10/2023 29  % Final   Lymphs Abs 11/10/2023 1.9  0.7 - 4.0 K/uL Final   Monocytes  Relative 11/10/2023 10  % Final   Monocytes Absolute 11/10/2023 0.6  0.1 - 1.0 K/uL Final   Eosinophils Relative 11/10/2023 1  % Final   Eosinophils Absolute 11/10/2023 0.1  0.0 - 0.5 K/uL Final   Basophils Relative 11/10/2023 1  % Final   Basophils Absolute 11/10/2023 0.0  0.0 - 0.1 K/uL Final   Immature Granulocytes 11/10/2023 0  % Final   Abs Immature Granulocytes 11/10/2023 0.02  0.00 - 0.07 K/uL Final   Performed at [facility], [address] [place], [place] [address]    Allergies: Chicken allergy, Milk-related compounds, and Egg protein-containing drug products  Medications:  Facility Ordered Medications  Medication   acetaminophen  (TYLENOL ) tablet 650 mg   alum & mag hydroxide-simeth (MAALOX/MYLANTA) 200-200-20 MG/5ML suspension 30 mL   magnesium  hydroxide (MILK OF MAGNESIA) suspension 30 mL   haloperidol  (HALDOL ) tablet 5 mg   And   diphenhydrAMINE  (BENADRYL ) capsule 50 mg   haloperidol  lactate (HALDOL ) injection 5 mg   And   diphenhydrAMINE  (BENADRYL ) injection 50 mg   And   LORazepam  (ATIVAN ) injection 2 mg   haloperidol  lactate (HALDOL ) injection 10 mg   And   diphenhydrAMINE  (BENADRYL )  injection 50 mg   And   LORazepam  (ATIVAN ) injection 2 mg   hydrOXYzine  (ATARAX ) tablet 25 mg   traZODone  (DESYREL ) tablet 50 mg   [START ON 01/13/2024] escitalopram  (LEXAPRO ) tablet 10 mg   PTA Medications  Medication Sig   albuterol  (VENTOLIN  HFA) 108 (90 Base) MCG/ACT inhaler Inhale 2 puffs into the lungs every 4 (four) hours as needed for wheezing or shortness of breath. (Patient not taking: Reported on 12/24/2023)   buPROPion  (WELLBUTRIN  XL) 300 MG 24 hr tablet Take 1 tablet (300 mg total) by mouth daily. (Patient not taking: Reported on 12/24/2023)   traZODone  (DESYREL ) 50 MG tablet Take 1 tablet (50 mg total) by mouth at bedtime as needed for sleep. (Patient not taking: Reported on 12/24/2023)   hydrOXYzine  (ATARAX ) 25 MG tablet Take 1 tablet (25 mg total)  by mouth 3 (three) times daily as needed for anxiety. (Patient not taking: Reported on 12/24/2023)   buPROPion  (WELLBUTRIN  XL) 300 MG 24 hr tablet Take 1 tablet (300 mg total) by mouth daily. APPT NEEDED FOR REFILLS. (Patient not taking: Reported on 12/24/2023)   escitalopram  (LEXAPRO ) 20 MG tablet Take 1 tablet (20 mg total) by mouth daily. APPT NEEDED FOR REFILLS (Patient not taking: Reported on 12/24/2023)   omeprazole  (PRILOSEC) 40 MG capsule Take 1 capsule (40 mg total) by mouth in the morning and at bedtime. (Patient not taking: Reported on 12/24/2023)   ondansetron  (ZOFRAN -ODT) 4 MG disintegrating tablet Take 1 tablet (4 mg total) by mouth every 6 (six) hours as needed for nausea or vomiting. (Patient not taking: Reported on 12/24/2023)      Medical Decision Making  Patient remains voluntary.  Patient admitted to [facility] behavioral health continuous observation while awaiting inpatient psychiatric treatment.  Laboratory studies ordered including CBC, CMP, ethanol, magnesium , prolactin and TSH.  Urine drug screen order initiated.  EKG ordered.   Current medications: -Acetaminophen  650 mg every 6 as needed/mild pain -Maalox 30 mL oral every 4 as needed/digestion -Hydroxyzine  25 mg 3 times daily as needed/anxiety -Magnesium  hydroxide 30 mL daily as needed/mild constipation -Trazodone  50 mg nightly as needed/sleep  Agitation protocol: MILD -Haloperidol  5 mg 3 times daily as needed mild agitation  -Diphenhydramine  50 mg p.o. 3 times daily as needed mild agitation  MODERATE -Haloperidol  5 mg IM 3 times daily as needed/moderate agitation -Diphenhydramine  50 mg IM 3 times daily as needed/moderate agitation -Lorazepam  2 mg IM 3 times daily as needed/moderate agitation  SEVERE -Haloperidol  10 mg IM 3 times daily as needed severe agitation -Diphenhydramine  50 mg IM 3 times daily as needed/severe agitation -Lorazepam  2 mg IM 3 times daily as needed/severe  agitation  Restart: -Escitalopram  10 mg daily/mood     Recommendations  Based on my evaluation the patient does not appear to have an emergency medical condition.  Ellouise LITTIE Dawn, FNP 01/12/24  3:19 PM

## 2024-01-12 NOTE — ED Notes (Signed)
 Pt dc to [facility], report given to sara, rn. Pt in satble condition. Pt left via safe transport

## 2024-01-12 NOTE — ED Notes (Signed)
 Pt brought on unit,oriented to unit. Patient reports worsening suicidal ideation over the past four days. The patient was able to verbally contract for safety at this time. Appearance: Dressed appropriately for the weather, malodorous Behavior: Pleasant, cooperative, seated calmly in the assessment area; no apparent distress. Orientation: Alert and oriented 4 Mood: Depressed .Affect: Congruent with mood. Speech: Normal rate and tone thought Process: Linear and goal-directed. Thought Content: Denies current auditory or visual hallucinations; reports seeing "black blurs or shadowy figures" over the past several months. No evidence of delusional thinking or response to internal stimuli at this time. Cognition: Intact. Insight/Judgment: Fair. Envt secured, poc ongoing
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
## 2024-01-12 NOTE — Progress Notes (Signed)
 Patient arrived to the unit. He is alert and oriented x 4. He expresses that he is having suicidal ideations and has a plan in mind but does not plan to carry them out on the unit. His goal is to get a really good medication regimen and decrease depression symptoms. Patient was oriented to the unit and patient rights and responsibilities were reviewed.   01/12/24 1856  Psych Admission Type (Psych Patients Only)  Admission Status Voluntary  Psychosocial Assessment  Patient Complaints Depression  Eye Contact Fair  Facial Expression Animated  Affect Depressed  Speech Logical/coherent  Interaction Assertive  Motor Activity Other (Comment) (WNL)  Appearance/Hygiene Unremarkable  Behavior Characteristics Cooperative;Appropriate to situation  Mood Depressed  Thought Process  Coherency WDL  Content WDL  Delusions None reported or observed  Perception Hallucinations  Hallucination Visual  Judgment Impaired  Confusion WDL  Danger to Self  Current suicidal ideation? Active  Agreement Not to Harm Self Yes  Description of Agreement verbal  Danger to Others  Danger to Others None reported or observed

## 2024-01-12 NOTE — Group Note (Signed)
 Date:  01/12/2024 Time:  9:54 PM  Group Topic/Focus:  Making Healthy Choices:   The focus of this group is to help patients identify negative/unhealthy choices they were using prior to admission and identify positive/healthier coping strategies to replace them upon discharge. Wrap-Up Group:   The focus of this group is to help patients review their daily goal of treatment and discuss progress on daily workbooks.    Participation Level:  Active  Participation Quality:  Appropriate and Attentive  Affect:  Appropriate  Cognitive:  Alert, Appropriate, and Oriented  Insight: Appropriate and Good  Engagement in Group:  Engaged  Modes of Intervention:  Discussion and Support  Additional Comments:  N/A  Christopher Forbes 01/12/2024, 9:54 PM

## 2024-01-13 LAB — PROLACTIN: Prolactin: 12.3 ng/mL (ref 3.6–31.5)

## 2024-01-13 MED ORDER — ARIPIPRAZOLE 2 MG PO TABS
2.0000 mg | ORAL_TABLET | Freq: Every day | ORAL | Status: DC
  Administered 2024-01-13 – 2024-01-15 (×3): 2 mg via ORAL
  Filled 2024-01-13 (×4): qty 1
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
## 2024-01-13 NOTE — Group Note (Signed)
 [facility] LCSW Group Therapy Note   Group Date: 01/13/2024 Start Time: 1245 End Time: 1352   Type of Therapy/Topic:  Group Therapy:  Balance in Life  Participation Level:  Active   Description of Group:    This group will address the concept of balance and how it feels and looks when one is unbalanced. Patients will be encouraged to process areas in their lives that are out of balance, and identify reasons for remaining unbalanced. Facilitators will guide patients utilizing problem- solving interventions to address and correct the stressor making their life unbalanced. Understanding and applying boundaries will be explored and addressed for obtaining  and maintaining a balanced life. Patients will be encouraged to explore ways to assertively make their unbalanced needs known to significant others in their lives, using other group members and facilitator for support and feedback.  Therapeutic Goals: Patient will identify two or more emotions or situations they have that consume much of in their lives. Patient will identify signs/triggers that life has become out of balance:  Patient will identify two ways to set boundaries in order to achieve balance in their lives:  Patient will demonstrate ability to communicate their needs through discussion and/or role plays  Summary of Patient Progress: During group, the patient explored how understanding their methods of giving and receiving love can influence social relationships utilizing an emotionally focused therapeutic Lense. Group members discussed times when they felt disconnected and identified strategies to communicate their emotional needs more effectively, fostering stronger interpersonal connections. Together, the patient and peers engaged in brainstorming ways to balance meeting the needs of others while maintaining personal boundaries and self-care. The patient was open to feedback, actively participated in discussions, and contributed  meaningfully to the overall group dynamic.     Therapeutic Modalities:   Cognitive Behavioral Therapy Solution-Focused Therapy Assertiveness Training   Alveta CHRISTELLA Kerns, LCSW

## 2024-01-13 NOTE — Group Note (Signed)
 Date:  01/13/2024 Time:  8:48 PM  Group Topic/Focus:  Wrap-Up Group:   The focus of this group is to help patients review their daily goal of treatment and discuss progress on daily workbooks.    Participation Level:  Minimal  Participation Quality:  Appropriate and Attentive  Affect:  Appropriate  Cognitive:  Alert and Appropriate  Insight: Appropriate and Good  Engagement in Group:  Limited  Modes of Intervention:  Activity  Additional Comments:     [name] 01/13/2024, 8:48 PM

## 2024-01-13 NOTE — Plan of Care (Signed)

## 2024-01-13 NOTE — Plan of Care (Signed)
   Problem: Education: Goal: Knowledge of Christopher Forbes [facility] Education information/materials will improve Outcome: Progressing Goal: Emotional status will improve Outcome: Progressing Goal: Mental status will improve Outcome: Progressing Goal: Verbalization of understanding the information provided will improve Outcome: Progressing   Problem: Activity: Goal: Interest or engagement in activities will improve Outcome: Progressing Goal: Sleeping patterns will improve Outcome: Progressing   Problem: Coping: Goal: Ability to verbalize frustrations and anger appropriately will improve Outcome: Progressing Goal: Ability to demonstrate self-control will improve Outcome: Progressing
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
## 2024-01-13 NOTE — Progress Notes (Signed)
   01/13/24 2022  Psych Admission Type (Psych Patients Only)  Admission Status Voluntary  Psychosocial Assessment  Patient Complaints Anxiety;Depression  Eye Contact Fair  Facial Expression Animated  Affect Depressed  Speech Logical/coherent  Interaction Assertive  Motor Activity Other (Comment) (WNL)  Appearance/Hygiene Unremarkable  Behavior Characteristics Cooperative  Mood Depressed  Thought Process  Coherency WDL  Content WDL  Delusions None reported or observed  Perception WDL  Hallucination Visual;None reported or observed  Judgment Limited  Confusion WDL  Danger to Self  Current suicidal ideation? Passive  Agreement Not to Harm Self Yes  Description of Agreement verbal  Danger to Others  Danger to Others None reported or observed

## 2024-01-13 NOTE — Group Note (Signed)
 Date:  01/13/2024 Time:  4:06 PM  Group Topic/Focus:  Overcoming Stress:   The focus of this group is to define stress and help patients assess their triggers.    Participation Level:  Active  Participation Quality:  Appropriate  Affect:  Appropriate  Cognitive:  Appropriate  Insight: Appropriate  Engagement in Group:  Engaged  Modes of Intervention:  Activity  Additional Comments:    [name] 01/13/2024, 4:06 PM

## 2024-01-13 NOTE — BHH Counselor (Signed)
 [name] met with the patient.  Patient declining aftercare referrals.   Patient also declining collateral contact.  Sherryle Margo, MSW, LCSW 01/13/2024 11:21 AM
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
## 2024-01-13 NOTE — Progress Notes (Signed)
   01/12/24 2017  Psych Admission Type (Psych Patients Only)  Admission Status Voluntary  Psychosocial Assessment  Patient Complaints Anxiety;Depression  Eye Contact Fair  Facial Expression Animated  Affect Depressed  Speech Logical/coherent  Interaction Assertive  Motor Activity Other (Comment) (WDL)  Appearance/Hygiene Unremarkable  Behavior Characteristics Cooperative;Appropriate to situation  Mood Depressed;Anxious  Thought Process  Coherency WDL  Content WDL  Delusions None reported or observed  Perception WDL  Hallucination None reported or observed  Judgment Limited  Confusion None  Danger to Self  Current suicidal ideation? Passive  Agreement Not to Harm Self Yes  Description of Agreement Verbal  Danger to Others  Danger to Others None reported or observed

## 2024-01-13 NOTE — Group Note (Signed)
 Date:  01/13/2024 Time:  10:39 AM  Group Topic/Focus:  Healthy Communication:   The focus of this group is to discuss communication, barriers to communication, as well as healthy ways to communicate with others.    Participation Level:  Active  Participation Quality:  Appropriate  Affect:  Appropriate  Cognitive:  Appropriate  Insight: Appropriate  Engagement in Group:  Engaged  Modes of Intervention:  Activity  Additional Comments:    Christopher Forbes Christopher Forbes 01/13/2024, 10:39 AM

## 2024-01-13 NOTE — BH IP Treatment Plan (Signed)
 Interdisciplinary Treatment and Diagnostic Plan Update  01/13/2024 Time of Session: 10:13 AM Christopher Forbes MRN: [id number]  Principal Diagnosis: MDD (major depressive disorder), recurrent episode, severe (HCC)  Secondary Diagnoses: Principal Problem:   MDD (major depressive disorder), recurrent episode, severe (HCC)   Current Medications:  Current Facility-Administered Medications  Medication Dose Route Frequency Provider Last Rate Last Admin   acetaminophen  (TYLENOL ) tablet 650 mg  650 mg Oral Q6H PRN Dasie Ellouise CROME, FNP   650 mg at 01/13/24 0808   alum & mag hydroxide-simeth (MAALOX/MYLANTA) 200-200-20 MG/5ML suspension 30 mL  30 mL Oral Q4H PRN Allen, Tina L, FNP       haloperidol  (HALDOL ) tablet 5 mg  5 mg Oral TID PRN Dasie Ellouise CROME, FNP       And   diphenhydrAMINE  (BENADRYL ) capsule 50 mg  50 mg Oral TID PRN Dasie Ellouise CROME, FNP       haloperidol  lactate (HALDOL ) injection 5 mg  5 mg Intramuscular TID PRN Dasie Ellouise CROME, FNP       And   diphenhydrAMINE  (BENADRYL ) injection 50 mg  50 mg Intramuscular TID PRN Dasie Ellouise CROME, FNP       And   LORazepam  (ATIVAN ) injection 2 mg  2 mg Intramuscular TID PRN Dasie Ellouise CROME, FNP       haloperidol  lactate (HALDOL ) injection 10 mg  10 mg Intramuscular TID PRN Dasie Ellouise CROME, FNP       And   diphenhydrAMINE  (BENADRYL ) injection 50 mg  50 mg Intramuscular TID PRN Dasie Ellouise CROME, FNP       And   LORazepam  (ATIVAN ) injection 2 mg  2 mg Intramuscular TID PRN Dasie Ellouise CROME, FNP       escitalopram  (LEXAPRO ) tablet 10 mg  10 mg Oral Daily Dasie Ellouise CROME, FNP   10 mg at 01/13/24 9191   hydrOXYzine  (ATARAX ) tablet 25 mg  25 mg Oral TID PRN Allen, Tina L, FNP   25 mg at 01/12/24 2106   magnesium  hydroxide (MILK OF MAGNESIA) suspension 30 mL  30 mL Oral Daily PRN Dasie Ellouise CROME, FNP       traZODone  (DESYREL ) tablet 50 mg  50 mg Oral QHS PRN Allen, Tina L, FNP   50 mg at 01/12/24 2106   PTA Medications: Medications Prior to Admission  Medication Sig  Dispense Refill Last Dose/Taking   albuterol  (VENTOLIN  HFA) 108 (90 Base) MCG/ACT inhaler Inhale 2 puffs into the lungs every 4 (four) hours as needed for wheezing or shortness of breath. (Patient not taking: Reported on 12/24/2023)      buPROPion  (WELLBUTRIN  XL) 300 MG 24 hr tablet Take 1 tablet (300 mg total) by mouth daily. (Patient not taking: Reported on 12/24/2023) 30 tablet 0    buPROPion  (WELLBUTRIN  XL) 300 MG 24 hr tablet Take 1 tablet (300 mg total) by mouth daily. APPT NEEDED FOR REFILLS. (Patient not taking: Reported on 12/24/2023) 30 tablet 0    escitalopram  (LEXAPRO ) 20 MG tablet Take 1 tablet (20 mg total) by mouth daily. APPT NEEDED FOR REFILLS (Patient not taking: Reported on 12/24/2023) 90 tablet 1    hydrOXYzine  (ATARAX ) 25 MG tablet Take 1 tablet (25 mg total) by mouth 3 (three) times daily as needed for anxiety. (Patient not taking: Reported on 12/24/2023) 30 tablet 0    omeprazole  (PRILOSEC) 40 MG capsule Take 1 capsule (40 mg total) by mouth in the morning and at bedtime. (Patient not taking: Reported on 12/24/2023) 60 capsule 0  ondansetron  (ZOFRAN -ODT) 4 MG disintegrating tablet Take 1 tablet (4 mg total) by mouth every 6 (six) hours as needed for nausea or vomiting. (Patient not taking: Reported on 12/24/2023) 20 tablet 0    traZODone  (DESYREL ) 50 MG tablet Take 1 tablet (50 mg total) by mouth at bedtime as needed for sleep. (Patient not taking: Reported on 12/24/2023) 30 tablet 0     Patient Stressors: Financial difficulties   Marital or family conflict    Patient Strengths: Other: patient declines to answer  Treatment Modalities: Medication Management, Group therapy, Case management,  1 to 1 session with clinician, Psychoeducation, Recreational therapy.   Physician Treatment Plan for Primary Diagnosis: MDD (major depressive disorder), recurrent episode, severe (HCC) Long Term Goal(s):     Short Term Goals:    Medication Management: Evaluate patient's response, side  effects, and tolerance of medication regimen.  Therapeutic Interventions: 1 to 1 sessions, Unit Group sessions and Medication administration.  Evaluation of Outcomes: Not Met  Physician Treatment Plan for Secondary Diagnosis: Principal Problem:   MDD (major depressive disorder), recurrent episode, severe (HCC)  Long Term Goal(s):     Short Term Goals:       Medication Management: Evaluate patient's response, side effects, and tolerance of medication regimen.  Therapeutic Interventions: 1 to 1 sessions, Unit Group sessions and Medication administration.  Evaluation of Outcomes: Not Met   RN Treatment Plan for Primary Diagnosis: MDD (major depressive disorder), recurrent episode, severe (HCC) Long Term Goal(s): Knowledge of disease and therapeutic regimen to maintain health will improve  Short Term Goals: Ability to verbalize frustration and anger appropriately will improve, Ability to demonstrate self-control, Ability to participate in decision making will improve, Ability to verbalize feelings will improve, Ability to disclose and discuss suicidal ideas, and Ability to identify and develop effective coping behaviors will improve  Medication Management: RN will administer medications as ordered by provider, will assess and evaluate patient's response and provide education to patient for prescribed medication. RN will report any adverse and/or side effects to prescribing provider.  Therapeutic Interventions: 1 on 1 counseling sessions, Psychoeducation, Medication administration, Evaluate responses to treatment, Monitor vital signs and CBGs as ordered, Perform/monitor CIWA, COWS, AIMS and Fall Risk screenings as ordered, Perform wound care treatments as ordered.  Evaluation of Outcomes: Not Met   LCSW Treatment Plan for Primary Diagnosis: MDD (major depressive disorder), recurrent episode, severe (HCC) Long Term Goal(s): Safe transition to appropriate next level of care at discharge,  Engage patient in therapeutic group addressing interpersonal concerns.  Short Term Goals: Engage patient in aftercare planning with referrals and resources, Increase social support, Increase ability to appropriately verbalize feelings, Increase emotional regulation, Facilitate acceptance of mental health diagnosis and concerns, Facilitate patient progression through stages of change regarding substance use diagnoses and concerns, Identify triggers associated with mental health/substance abuse issues, and Increase skills for wellness and recovery  Therapeutic Interventions: Assess for all discharge needs, 1 to 1 time with Social worker, Explore available resources and support systems, Assess for adequacy in community support network, Educate family and significant other(s) on suicide prevention, Complete Psychosocial Assessment, Interpersonal group therapy.  Evaluation of Outcomes: Not Met   Progress in Treatment: Attending groups: Yes. Participating in groups: Yes. Taking medication as prescribed: Yes. Toleration medication: Yes. Family/Significant other contact made: No, will contact:  [name] to contact once permission is granted.  Patient understands diagnosis: Yes. Discussing patient identified problems/goals with staff: Yes. Medical problems stabilized or resolved: Yes. Denies suicidal/homicidal ideation: Yes. Issues/concerns per  patient self-inventory: Yes. Other: None  New problem(s) identified: No, Describe:  None  New Short Term/Long Term Goal(s):detox, elimination of symptoms of psychosis, medication management for mood stabilization; elimination of SI thoughts; development of comprehensive mental wellness/sobriety plan.    Patient Goals:  Try to get my depression and anxiety under control.  Discharge Plan or Barriers: [name] to assist with the development of appropriate discharge plan.    Reason for Continuation of Hospitalization: Anxiety Depression Suicidal  ideation  Estimated Length of Stay: 1-7 days.   Last 3 [place] Suicide Severity Risk Score: Flowsheet Row Admission (Current) from 01/12/2024 in [facility] Most recent reading at 01/12/2024  6:44 PM ED from 01/12/2024 in [facility] Most recent reading at 01/12/2024  3:34 PM ED from 12/16/2023 in [facility] Emergency Department at [facility] Most recent reading at 12/16/2023  1:46 PM  C-SSRS RISK CATEGORY High Risk High Risk No Risk    Last PHQ 2/9 Scores:    12/30/2022    4:03 PM 11/20/2022   11:11 AM 10/09/2022    3:26 PM  Depression screen PHQ 2/9  Decreased Interest 1 1 2   Down, Depressed, Hopeless 1 2 2   PHQ - 2 Score 2 3 4   Altered sleeping 0 1 1  Tired, decreased energy 1 2 2   Change in appetite 0 2 3  Feeling bad or failure about yourself  1 1 3   Trouble concentrating 0 1 2  Moving slowly or fidgety/restless 0 1 1  Suicidal thoughts 0 0 0  PHQ-9 Score 4 11 16   Difficult doing work/chores Somewhat difficult Somewhat difficult Somewhat difficult    Scribe for Treatment Team: Alveta CHRISTELLA Kerns, LCSW 01/13/2024 10:45 AM

## 2024-01-13 NOTE — Progress Notes (Signed)
   01/13/24 0859  Psych Admission Type (Psych Patients Only)  Admission Status Voluntary  Psychosocial Assessment  Patient Complaints Anxiety;Depression  Eye Contact Fair  Facial Expression Flat  Affect Flat  Speech Soft  Interaction Assertive  Motor Activity Slow  Appearance/Hygiene Unremarkable  Behavior Characteristics Cooperative  Mood Depressed  Aggressive Behavior  Effect No apparent injury  Thought Process  Coherency WDL  Content WDL  Delusions None reported or observed  Perception WDL  Hallucination None reported or observed  Judgment Limited  Confusion WDL  Danger to Self  Current suicidal ideation? Passive  Self-Injurious Behavior No self-injurious ideation or behavior indicators observed or expressed   Agreement Not to Harm Self Yes  Description of Agreement verbal  Danger to Others  Danger to Others None reported or observed
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
## 2024-01-13 NOTE — BHH Counselor (Signed)
 [name] provided patient with the Food Stamps application for [facility] with the instruction to return it so [name] can send it to the [facility] Department of [employer].  Sherryle Margo, MSW, LCSW 01/13/2024 3:32 PM

## 2024-01-13 NOTE — Group Note (Signed)
 Recreation Therapy Group Note   Group Topic:Healthy Support Systems  Group Date: 01/13/2024 Start Time: 1030 End Time: 1130 Facilitators: Celestia Jeoffrey BRAVO, LRT, CTRS Location: Craft Room  Group Description: Straw Bridge. In groups or individually, patients were given 10 plastic drinking straws and an equal length of masking tape. Using the materials provided, patients were instructed to build a free-standing bridge-like structure to suspend an everyday item (ex: deck of cards) off the floor or table surface. All materials were required to be used in secondary school teacher. LRT facilitated post-activity discussion reviewing the importance of having strong and healthy support systems in our lives. LRT discussed how the people in our lives serve as the tape and the deck of cards we placed on top of our straw structure are the stressors we face in daily life. LRT and pts discussed what happens in our life when things get too heavy for us , and we don't have strong supports outside of the hospital. Pt shared 2 of their healthy supports in their life aloud in the group.   Goal Area(s) Addressed:  Patient will identify 2 healthy supports in their life. Patient will identify skills to successfully complete activity. Patient will identify correlation of this activity to life post-discharge.  Patient will build on frustration tolerance skills. Patient will increase team building and communication skills.    Affect/Mood: N/A   Participation Level: Did not attend    Clinical Observations/Individualized Feedback: Patient did not attend group.   Plan: Continue to engage patient in RT group sessions 2-3x/week.   Jeoffrey BRAVO Celestia, LRT, CTRS 01/13/2024 1:17 PM

## 2024-01-14 MED ORDER — BUPROPION HCL ER (XL) 150 MG PO TB24
150.0000 mg | ORAL_TABLET | Freq: Every day | ORAL | Status: DC
  Administered 2024-01-15 – 2024-01-16 (×2): 150 mg via ORAL
  Filled 2024-01-14 (×2): qty 1

## 2024-01-14 NOTE — Progress Notes (Signed)
   01/14/24 0845  Psych Admission Type (Psych Patients Only)  Admission Status Voluntary  Psychosocial Assessment  Patient Complaints Anxiety;Depression  Eye Contact Fair  Facial Expression Animated  Affect Depressed  Speech Logical/coherent  Interaction Assertive  Motor Activity Other (Comment) (WNL)  Appearance/Hygiene Layered clothes  Behavior Characteristics Cooperative  Mood Depressed  Aggressive Behavior  Effect No apparent injury  Thought Process  Coherency WDL  Content WDL  Delusions None reported or observed  Perception Hallucinations (denied to this RN but endorsed with Crystal PA)  Hallucination Auditory;Visual  Judgment Impaired  Confusion WDL  Danger to Self  Current suicidal ideation? Denies  Agreement Not to Harm Self Yes  Description of Agreement Verbal  Danger to Others  Danger to Others None reported or observed

## 2024-01-14 NOTE — Progress Notes (Signed)
 [facility] MD Progress Note  01/14/2024 10:50 AM Christopher Forbes  MRN:  [id number]   Subjective:  Chart reviewed, case discussed in multidisciplinary meeting, patient seen during rounds.   On interview today, patient is noted to be interacting appropriately with peers in the milieu.  He returns to his room to engage in interview with provider.  He is noted to be calm and cooperative, alert and oriented.  When asked about suicidal ideation patient replies a little, and when asked about intent or plan, patient replies not yet.  Patient denies HI/plan.  He endorses visual hallucinations involving a black blur and shadow figures.  He endorses previous auditory hallucinations approximately 3 days ago but not currently.  Auditory hallucinations involve a deep, rough-sounding male voice calling patient's name.  He states he only ever hears name called and does not recognize the voice.  He denies command hallucinations.  He states he hears auditory hallucinations approximately twice every couple of days.  He rates depression at 7 out of 10 and anxiety at 6 out of 10 today.  He is tolerating current medication regimen well without adverse effects.  He states he prefers to discontinue Lexapro  due to previous ineffectiveness at 20 mg dose.  He requests to restart Wellbutrin , which he states was effective in the past and well-tolerated.  He denies history of seizures, anorexia nervosa, or bulimia.   Sleep: Fair  Appetite:  Fair  Past Psychiatric History: see h&P Family History:  Family History  Problem Relation Age of Onset   Migraines Mother    Diabetes Father    Hyperlipidemia Father    Hypertension Father    COPD Father    Asthma Father    Migraines Brother    Stroke Maternal Grandmother    Colon cancer Neg Hx    Esophageal cancer Neg Hx    Rectal cancer Neg Hx    Stomach cancer Neg Hx    Social History:  Social History   Substance and Sexual Activity  Alcohol Use No   Alcohol/week: 0.0  standard drinks of alcohol     Social History   Substance and Sexual Activity  Drug Use No    Social History   Socioeconomic History   Marital status: Single    Spouse name: Not on file   Number of children: Not on file   Years of education: Not on file   Highest education level: 11th grade  Occupational History   Occupation: consulting civil engineer  Tobacco Use   Smoking status: Some Days    Current packs/day: 0.25    Types: Cigarettes   Smokeless tobacco: Never   Tobacco comments:    1 -2 cigs a day for the past two months   Vaping Use   Vaping status: Former  Substance and Sexual Activity   Alcohol use: No    Alcohol/week: 0.0 standard drinks of alcohol   Drug use: No   Sexual activity: Yes    Partners: Male  Other Topics Concern   Not on file  Social History Narrative   Patient lives with brother step sister and step dad Transport Planner. Mom is involved. Right handed      06/21/2023 Pt states I live on the street, I don't live with my step dad   Social Drivers of Health   Financial Resource Strain: High Risk (02/25/2023)   Overall Financial Resource Strain (CARDIA)    Difficulty of Paying Living Expenses: Very hard  Food Insecurity: No Food Insecurity (01/12/2024)   Hunger  Vital Sign    Worried About Programme Researcher, Broadcasting/film/video in the Last Year: Never true    Ran Out of Food in the Last Year: Never true  Recent Concern: Food Insecurity - Food Insecurity Present (01/12/2024)   Hunger Vital Sign    Worried About Running Out of Food in the Last Year: Sometimes true    Ran Out of Food in the Last Year: Sometimes true  Transportation Needs: No Transportation Needs (01/12/2024)   PRAPARE - Administrator, Civil Service (Medical): No    Lack of Transportation (Non-Medical): No  Physical Activity: Insufficiently Active (02/25/2023)   Exercise Vital Sign    Days of Exercise per Week: 3 days    Minutes of Exercise per Session: 30 min  Stress: Stress Concern Present (02/25/2023)    [employer] of Occupational Health - Occupational Stress Questionnaire    Feeling of Stress : To some extent  Social Connections: Socially Isolated (02/25/2023)   Social Connection and Isolation Panel    Frequency of Communication with Friends and Family: Twice a week    Frequency of Social Gatherings with Friends and Family: Once a week    Attends Religious Services: Never    Database Administrator or Organizations: No    Attends Engineer, Structural: Not on file    Marital Status: Separated   Past Medical History:  Past Medical History:  Diagnosis Date   Acne    Allergy    Anxiety    Asthma    Chronic headaches    Depression    GERD (gastroesophageal reflux disease)     Past Surgical History:  Procedure Laterality Date   OPEN REDUCTION INTERNAL FIXATION (ORIF) METACARPAL Right 11/19/2018   Procedure: Right small finger metacarpal open malunion repair;  Surgeon: Shari Easter, MD;  Location: [facility];  Service: Orthopedics;  Laterality: Right;  90 minutes   TYMPANOSTOMY TUBE PLACEMENT Bilateral    placed age 75 have fallen out   UPPER GASTROINTESTINAL ENDOSCOPY     WISDOM TOOTH EXTRACTION  04/04/2021    Current Medications: Current Facility-Administered Medications  Medication Dose Route Frequency Provider Last Rate Last Admin   acetaminophen  (TYLENOL ) tablet 650 mg  650 mg Oral Q6H PRN Dasie Ellouise CROME, FNP   650 mg at 01/13/24 0808   alum & mag hydroxide-simeth (MAALOX/MYLANTA) 200-200-20 MG/5ML suspension 30 mL  30 mL Oral Q4H PRN Allen, Tina L, FNP       ARIPiprazole (ABILIFY) tablet 2 mg  2 mg Oral Daily Smith, Annie B, NP   2 mg at 01/14/24 9157   haloperidol  (HALDOL ) tablet 5 mg  5 mg Oral TID PRN Dasie Ellouise CROME, FNP       And   diphenhydrAMINE  (BENADRYL ) capsule 50 mg  50 mg Oral TID PRN Dasie Ellouise CROME, FNP       haloperidol  lactate (HALDOL ) injection 5 mg  5 mg Intramuscular TID PRN Dasie Ellouise CROME, FNP       And   diphenhydrAMINE  (BENADRYL )  injection 50 mg  50 mg Intramuscular TID PRN Dasie Ellouise CROME, FNP       And   LORazepam  (ATIVAN ) injection 2 mg  2 mg Intramuscular TID PRN Dasie Ellouise CROME, FNP       haloperidol  lactate (HALDOL ) injection 10 mg  10 mg Intramuscular TID PRN Dasie Ellouise CROME, FNP       And   diphenhydrAMINE  (BENADRYL ) injection 50 mg  50 mg Intramuscular  TID PRN Dasie Ellouise CROME, FNP       And   LORazepam  (ATIVAN ) injection 2 mg  2 mg Intramuscular TID PRN Dasie Ellouise CROME, FNP       escitalopram  (LEXAPRO ) tablet 10 mg  10 mg Oral Daily Dasie Ellouise CROME, FNP   10 mg at 01/14/24 9157   hydrOXYzine  (ATARAX ) tablet 25 mg  25 mg Oral TID PRN Dasie Ellouise CROME, FNP   25 mg at 01/12/24 2106   magnesium  hydroxide (MILK OF MAGNESIA) suspension 30 mL  30 mL Oral Daily PRN Dasie Ellouise CROME, FNP       traZODone  (DESYREL ) tablet 50 mg  50 mg Oral QHS PRN Dasie Ellouise CROME, FNP   50 mg at 01/13/24 2053    Lab Results:  Results for orders placed or performed during the hospital encounter of 01/12/24 (from the past 48 hours)  CBC with Differential/Platelet     Status: Abnormal   Collection Time: 01/12/24  3:32 PM  Result Value Ref Range   WBC 8.0 4.0 - 10.5 K/uL   RBC 5.67 4.22 - 5.81 MIL/uL   Hemoglobin 17.1 (H) 13.0 - 17.0 g/dL   HCT 48.6 60.9 - 47.9 %   MCV 90.5 80.0 - 100.0 fL   MCH 30.2 26.0 - 34.0 pg   MCHC 33.3 30.0 - 36.0 g/dL   RDW 87.8 88.4 - 84.4 %   Platelets 245 150 - 400 K/uL   nRBC 0.0 0.0 - 0.2 %   Neutrophils Relative % 77 %   Neutro Abs 6.1 1.7 - 7.7 K/uL   Lymphocytes Relative 17 %   Lymphs Abs 1.4 0.7 - 4.0 K/uL   Monocytes Relative 5 %   Monocytes Absolute 0.4 0.1 - 1.0 K/uL   Eosinophils Relative 0 %   Eosinophils Absolute 0.0 0.0 - 0.5 K/uL   Basophils Relative 1 %   Basophils Absolute 0.0 0.0 - 0.1 K/uL   Immature Granulocytes 0 %   Abs Immature Granulocytes 0.02 0.00 - 0.07 K/uL    Comment: Performed at [facility], [address], [place], [place] [address]  Comprehensive metabolic panel     Status:  Abnormal   Collection Time: 01/12/24  3:32 PM  Result Value Ref Range   Sodium 139 135 - 145 mmol/L   Potassium 4.2 3.5 - 5.1 mmol/L   Chloride 101 98 - 111 mmol/L   CO2 26 22 - 32 mmol/L   Glucose, Bld 77 70 - 99 mg/dL    Comment: Glucose reference range applies only to samples taken after fasting for at least 8 hours.   BUN 7 6 - 20 mg/dL   Creatinine, Ser 9.06 0.61 - 1.24 mg/dL   Calcium 89.7 8.9 - 89.6 mg/dL   Total Protein 8.3 (H) 6.5 - 8.1 g/dL   Albumin 5.1 (H) 3.5 - 5.0 g/dL   AST 20 15 - 41 U/L   ALT 17 0 - 44 U/L   Alkaline Phosphatase 60 38 - 126 U/L   Total Bilirubin 0.9 0.0 - 1.2 mg/dL   GFR, Estimated >39 >39 mL/min    Comment: (NOTE) Calculated using the CKD-EPI Creatinine Equation (2021)    Anion gap 12 5 - 15    Comment: Performed at [facility], [address] [place], [place] [address]  Magnesium      Status: None   Collection Time: 01/12/24  3:32 PM  Result Value Ref Range   Magnesium  2.1 1.7 - 2.4  mg/dL    Comment: Performed at [facility], [address], [place], [place] [address]  Ethanol     Status: None   Collection Time: 01/12/24  3:32 PM  Result Value Ref Range   Alcohol, Ethyl (B) <15 <15 mg/dL    Comment: (NOTE) For medical purposes only. Performed at [facility], [address], [place], [place] [address]   TSH     Status: None   Collection Time: 01/12/24  3:32 PM  Result Value Ref Range   TSH 1.628 0.350 - 4.500 uIU/mL    Comment: Performed by a 3rd Generation assay with a functional sensitivity of <=0.01 uIU/mL. Performed at [facility], [address], [place], [place] [address]   Prolactin     Status: None   Collection Time: 01/12/24  3:32 PM  Result Value Ref Range   Prolactin 12.3 3.6 - 31.5 ng/mL    Comment: (NOTE) Performed At: [facility] [address] [place], [place] [id number] Jennette Shorter MD Ey:[id number]   POCT Urine Drug Screen - (I-Screen)     Status: Abnormal   Collection  Time: 01/12/24  3:39 PM  Result Value Ref Range   POC Amphetamine UR None Detected NONE DETECTED (Cut Off Level 1000 ng/mL)   POC Secobarbital (BAR) None Detected NONE DETECTED (Cut Off Level 300 ng/mL)   POC Buprenorphine (BUP) None Detected NONE DETECTED (Cut Off Level 10 ng/mL)   POC Oxazepam (BZO) None Detected NONE DETECTED (Cut Off Level 300 ng/mL)   POC Cocaine UR None Detected NONE DETECTED (Cut Off Level 300 ng/mL)   POC Methamphetamine UR None Detected NONE DETECTED (Cut Off Level 1000 ng/mL)   POC Morphine None Detected NONE DETECTED (Cut Off Level 300 ng/mL)   POC Methadone UR None Detected NONE DETECTED (Cut Off Level 300 ng/mL)   POC Oxycodone UR None Detected NONE DETECTED (Cut Off Level 100 ng/mL)   POC Marijuana UR Positive (A) NONE DETECTED (Cut Off Level 50 ng/mL)    Blood Alcohol level:  Lab Results  Component Value Date   [facility] <15 01/12/2024   ETH <10 06/20/2023    Metabolic Disorder Labs: Lab Results  Component Value Date   HGBA1C 4.7 (L) 06/20/2023   MPG 88.19 06/20/2023   MPG 93.93 03/04/2023   Lab Results  Component Value Date   PROLACTIN 12.3 01/12/2024   Lab Results  Component Value Date   CHOL 231 (H) 06/20/2023   TRIG 221 (H) 06/20/2023   HDL 46 06/20/2023   CHOLHDL 5.0 06/20/2023   VLDL 44 (H) 06/20/2023   LDLCALC 141 (H) 06/20/2023    Physical Findings: AIMS:  , ,  ,  ,    CIWA:    COWS:      Psychiatric Specialty Exam:  Presentation  General Appearance:  Disheveled  Eye Contact: Good  Speech: Clear and Coherent; Normal Rate  Speech Volume: Normal    Mood and Affect  Mood: Depressed  Affect: Congruent   Thought Process  Thought Processes: Coherent; Goal Directed; Linear  Orientation:Full (Time, Place and Person)  Thought Content:Logical  Hallucinations: Visual Ideas of Reference:None  Suicidal Thoughts: Yes, without intent, without plan Homicidal Thoughts: No  Sensorium  Memory: Immediate Good;  Recent Good  Judgment: Fair  Insight: Fair   Executive Functions  Concentration: Good  Attention Span: Good  Recall: Good  Fund of Knowledge: Fair  Language: Fair   Psychomotor Activity  Psychomotor Activity: Normal Musculoskeletal: Strength &  Muscle Tone: within normal limits Gait & Station: normal Assets  Assets: Manufacturing Systems Engineer; Desire for Improvement; Physical Health; Social Support; Resilience; Vocational/Educational; Financial Resources/Insurance    Physical Exam: Physical Exam ROS Blood pressure 108/70, pulse 79, temperature 98.1 F (36.7 C), temperature source Oral, resp. rate 18, height 5' 10 (1.778 m), weight 69.9 kg, SpO2 100%. Body mass index is 22.1 kg/m.  Diagnosis: Principal Problem:   MDD (major depressive disorder), recurrent episode, severe (HCC)   PLAN: Safety and Monitoring:  -- Voluntary admission to inpatient psychiatric unit for safety, stabilization and treatment  -- Daily contact with patient to assess and evaluate symptoms and progress in treatment  -- Patient's case to be discussed in multi-disciplinary team meeting  -- Observation Level : q15 minute checks  -- Vital signs:  q12 hours  -- Precautions: suicide, elopement, and assault -- Encouraged patient to participate in unit milieu and in scheduled group therapies  2. Psychiatric Treatment:  Scheduled Medications: Discontinue Lexapro  due to past ineffectiveness at 20 mg dose per patient report Start Wellbutrin  XL 150 mg once daily starting tomorrow morning, patient reports this was previously effective and well-tolerated  Continue Abilify 2 mg once daily   -- The risks/benefits/side-effects/alternatives to this medication were discussed in detail with the patient and time was given for questions. The patient consents to medication trial.   3. Medical Issues Being Addressed:  No acute medical issues identified at this time.   4. Discharge Planning:   -- Social  work and case management to assist with discharge planning and identification of hospital follow-up needs prior to discharge  -- Estimated LOS: 5-7 days  [employer], PA-C 01/14/2024, 10:50 AM

## 2024-01-14 NOTE — Group Note (Signed)
 Recreation Therapy Group Note   Group Topic:Problem Solving  Group Date: 01/14/2024 Start Time: 1005 End Time: 1100 Facilitators: Celestia Jeoffrey BRAVO, LRT, CTRS Location: Craft Room  Group Description: Life Boat. Patients were given the scenario that they are on a boat that is about to become shipwrecked, leaving them stranded on an island. They are asked to make a list of 15 different items that they want to take with them when they are stranded [place] the [place]. Patients are asked to rank their items from most important to least important, #1 being the most important and #15 being the least. Patients will work individually for the first round to come up with 15 items and then pair up with a peer(s) to condense their list and come up with one list of 15 items between the two of them. Patients or LRT will read aloud the 15 different items to the group after each round. LRT facilitated post-activity processing to discuss how this activity can be used in daily life post discharge.   Goal Area(s) Addressed:  Patient will identify priorities, wants and needs. Patient will communicate with LRT and peers. Patient will work collectively as a administrator, civil service. Patient will work on product manager.    Affect/Mood: Appropriate   Participation Level: Minimal    Clinical Observations/Individualized Feedback: Christopher Forbes was present in group. Pt did not make an individual list or contribute to the group list.   Plan: Continue to engage patient in RT group sessions 2-3x/week.   Jeoffrey BRAVO Celestia, LRT, CTRS 01/14/2024 1:02 PM

## 2024-01-14 NOTE — Group Note (Signed)
 [facility] LCSW Group Therapy Note   Group Date: 01/14/2024 Start Time: 1315 End Time: 1415  Type of Therapy/Topic:  Group Therapy:  Feelings about Diagnosis  Participation Level:  None   Description of Group:    This group will allow patients to explore their thoughts and feelings about diagnoses they have received. Patients will be guided to explore their level of understanding and acceptance of these diagnoses. Facilitator will encourage patients to process their thoughts and feelings about the reactions of others to their diagnosis, and will guide patients in identifying ways to discuss their diagnosis with significant others in their lives. This group will be process-oriented, with patients participating in exploration of their own experiences as well as giving and receiving support and challenge from other group members.   Therapeutic Goals: 1. Patient will demonstrate understanding of diagnosis as evidence by identifying two or more symptoms of the disorder:  2. Patient will be able to express two feelings regarding the diagnosis 3. Patient will demonstrate ability to communicate their needs through discussion and/or role plays  Summary of Patient Progress: Patient was present at the beginning of group, however, left early. Patient did not participate in the group discussion.   Therapeutic Modalities:   Cognitive Behavioral Therapy Brief Therapy Feelings Identification    Sherryle JINNY Margo, LCSW

## 2024-01-14 NOTE — Plan of Care (Signed)

## 2024-01-14 NOTE — Plan of Care (Signed)
   Problem: Education: Goal: Emotional status will improve Outcome: Progressing Goal: Mental status will improve Outcome: Progressing

## 2024-01-14 NOTE — Progress Notes (Signed)
   01/14/24 2100  Psych Admission Type (Psych Patients Only)  Admission Status Voluntary  Psychosocial Assessment  Patient Complaints Anxiety;Depression  Eye Contact Fair  Facial Expression Animated  Affect Depressed  Speech Logical/coherent  Interaction Assertive  Motor Activity Other (Comment)  Appearance/Hygiene Layered clothes  Behavior Characteristics Cooperative  Mood Depressed  Thought Process  Coherency WDL  Content WDL  Delusions None reported or observed  Perception WDL  Hallucination Auditory;Visual  Judgment Impaired  Confusion WDL  Danger to Self  Current suicidal ideation? Denies  Agreement Not to Harm Self Yes  Description of Agreement verbal  Danger to Others  Danger to Others None reported or observed

## 2024-01-14 NOTE — Group Note (Signed)
 Date:  01/14/2024 Time:  9:24 PM  Group Topic/Focus:  Wrap-Up Group:   The focus of this group is to help patients review their daily goal of treatment and discuss progress on daily workbooks.    Participation Level:  Active  Participation Quality:  Sharing  Affect:  Appropriate  Cognitive:  Appropriate  Insight: Good  Engagement in Group:  Engaged  Modes of Intervention:  Socialization  Additional Comments:    Kristen VEAR Gibbon 01/14/2024, 9:24 PM

## 2024-01-15 DIAGNOSIS — F332 Major depressive disorder, recurrent severe without psychotic features: Secondary | ICD-10-CM

## 2024-01-15 MED ORDER — NAPROXEN 500 MG PO TABS
250.0000 mg | ORAL_TABLET | Freq: Two times a day (BID) | ORAL | Status: DC
  Administered 2024-01-16 – 2024-01-23 (×15): 250 mg via ORAL
  Filled 2024-01-15 (×15): qty 1

## 2024-01-15 MED ORDER — ARIPIPRAZOLE 5 MG PO TABS
5.0000 mg | ORAL_TABLET | Freq: Every day | ORAL | Status: DC
  Administered 2024-01-16 – 2024-01-19 (×4): 5 mg via ORAL
  Filled 2024-01-15 (×4): qty 1

## 2024-01-15 NOTE — Plan of Care (Signed)
  Problem: Education: Goal: Knowledge of [facility] Education information/materials will improve 01/15/2024 1741 by Shirley Jon FALCON, RN Outcome: Not Progressing 01/15/2024 1733 by Shirley Jon FALCON, RN Outcome: Not Progressing Goal: Emotional status will improve 01/15/2024 1741 by Shirley Jon FALCON, RN Outcome: Not Progressing 01/15/2024 1733 by Shirley Jon FALCON, RN Outcome: Not Progressing Goal: Mental status will improve 01/15/2024 1741 by Shirley Jon FALCON, RN Outcome: Not Progressing 01/15/2024 1733 by Shirley Jon FALCON, RN Outcome: Not Progressing Goal: Verbalization of understanding the information provided will improve 01/15/2024 1741 by Shirley Jon FALCON, RN Outcome: Not Progressing 01/15/2024 1733 by Shirley Jon FALCON, RN Outcome: Not Progressing   Problem: Activity: Goal: Interest or engagement in activities will improve 01/15/2024 1741 by Shirley Jon FALCON, RN Outcome: Not Progressing 01/15/2024 1733 by Shirley Jon FALCON, RN Outcome: Not Progressing Goal: Sleeping patterns will improve 01/15/2024 1741 by Shirley Jon FALCON, RN Outcome: Not Progressing 01/15/2024 1733 by Shirley Jon FALCON, RN Outcome: Not Progressing   Problem: Coping: Goal: Ability to verbalize frustrations and anger appropriately will improve 01/15/2024 1741 by Shirley Jon FALCON, RN Outcome: Not Progressing 01/15/2024 1733 by Shirley Jon FALCON, RN Outcome: Not Progressing Goal: Ability to demonstrate self-control will improve 01/15/2024 1741 by Shirley Jon FALCON, RN Outcome: Not Progressing 01/15/2024 1733 by Shirley Jon FALCON, RN Outcome: Not Progressing   Problem: Health Behavior/Discharge Planning: Goal: Identification of resources available to assist in meeting health care needs will improve 01/15/2024 1741 by Shirley Jon FALCON, RN Outcome: Not Progressing 01/15/2024 1733 by Shirley Jon FALCON, RN Outcome: Not Progressing Goal: Compliance with treatment plan for underlying  cause of condition will improve 01/15/2024 1741 by Shirley Jon FALCON, RN Outcome: Not Progressing 01/15/2024 1733 by Shirley Jon FALCON, RN Outcome: Not Progressing   Problem: Physical Regulation: Goal: Ability to maintain clinical measurements within normal limits will improve 01/15/2024 1741 by Shirley Jon FALCON, RN Outcome: Not Progressing 01/15/2024 1733 by Shirley Jon FALCON, RN Outcome: Not Progressing   Problem: Safety: Goal: Periods of time without injury will increase 01/15/2024 1741 by Shirley Jon FALCON, RN Outcome: Not Progressing 01/15/2024 1733 by Shirley Jon FALCON, RN Outcome: Not Progressing

## 2024-01-15 NOTE — Progress Notes (Signed)
 [facility] MD Progress Note  01/15/2024 4:46 PM Christopher Forbes  MRN:  [id number]   Subjective:  Chart reviewed, case discussed in multidisciplinary meeting, patient seen during rounds.   10/29: On interview today, patient is noted to be calm and cooperative, alert and oriented.  He is tolerating current medication regimen well without adverse effects.  He rates depression as 6 out of 10 and anxiety as 6 out of 10 today.  He endorses suicidal ideation without intent or plan.  He is able to contract for safety.  He has not been observed engaging in self-harm behaviors.  He denies HI/plan.  He endorses visual hallucinations but states these have somewhat improved.  He denies current auditory hallucinations.  He is agreeable to Abilify dose increased at this time.  Shortly after dinner today patient reported left hip pain to nurse, who then informed provider.  Provider returned to interview patient regarding pain.  Patient reports left hip pain that started around dinnertime this evening.  He is noted to be walking with a limp.  He denies fall or acute trauma.  He denies previous injury to this site.  He denies pain in any other sites.  He reports history of arthritis but states this does not feel like a typical arthritis flare.  Vital signs are within normal limits. Per nursing report, patient requested narcotic pain medication and declined Tylenol  or ibuprofen . Will add naproxen  250 mg twice daily and continue to monitor. Patient advised to alert staff is pain is worsening, or if other symptoms such as swelling, increased warmth, or redness are noted.   10/28: On interview today, patient is noted to be interacting appropriately with peers in the milieu.  He returns to his room to engage in interview with provider.  He is noted to be calm and cooperative, alert and oriented.  When asked about suicidal ideation patient replies a little, and when asked about intent or plan, patient replies not yet.  Patient  denies HI/plan.  He endorses visual hallucinations involving a black blur and shadow figures.  He endorses previous auditory hallucinations approximately 3 days ago but not currently.  Auditory hallucinations involve a deep, rough-sounding male voice calling patient's name.  He states he only ever hears name called and does not recognize the voice.  He denies command hallucinations.  He states he hears auditory hallucinations approximately twice every couple of days.  He rates depression at 7 out of 10 and anxiety at 6 out of 10 today.  He is tolerating current medication regimen well without adverse effects.  He states he prefers to discontinue Lexapro  due to previous ineffectiveness at 20 mg dose.  He requests to restart Wellbutrin , which he states was effective in the past and well-tolerated.  He denies history of seizures, anorexia nervosa, or bulimia.   Sleep: Fair  Appetite:  Fair  Past Psychiatric History: see h&P Family History:  Family History  Problem Relation Age of Onset   Migraines Mother    Diabetes Father    Hyperlipidemia Father    Hypertension Father    COPD Father    Asthma Father    Migraines Brother    Stroke Maternal Grandmother    Colon cancer Neg Hx    Esophageal cancer Neg Hx    Rectal cancer Neg Hx    Stomach cancer Neg Hx    Social History:  Social History   Substance and Sexual Activity  Alcohol Use No   Alcohol/week: 0.0 standard drinks of alcohol  Social History   Substance and Sexual Activity  Drug Use No    Social History   Socioeconomic History   Marital status: Single    Spouse name: Not on file   Number of children: Not on file   Years of education: Not on file   Highest education level: 11th grade  Occupational History   Occupation: student  Tobacco Use   Smoking status: Some Days    Current packs/day: 0.25    Types: Cigarettes   Smokeless tobacco: Never   Tobacco comments:    1 -2 cigs a day for the past two months    Vaping Use   Vaping status: Former  Substance and Sexual Activity   Alcohol use: No    Alcohol/week: 0.0 standard drinks of alcohol   Drug use: No   Sexual activity: Yes    Partners: Male  Other Topics Concern   Not on file  Social History Narrative   Patient lives with brother step sister and step dad Transport Planner. Mom is involved. Right handed      06/21/2023 Pt states I live on the street, I don't live with my step dad   Social Drivers of Health   Financial Resource Strain: High Risk (02/25/2023)   Overall Financial Resource Strain (CARDIA)    Difficulty of Paying Living Expenses: Very hard  Food Insecurity: No Food Insecurity (01/12/2024)   Hunger Vital Sign    Worried About Running Out of Food in the Last Year: Never true    Ran Out of Food in the Last Year: Never true  Recent Concern: Food Insecurity - Food Insecurity Present (01/12/2024)   Hunger Vital Sign    Worried About Running Out of Food in the Last Year: Sometimes true    Ran Out of Food in the Last Year: Sometimes true  Transportation Needs: No Transportation Needs (01/12/2024)   PRAPARE - Administrator, Civil Service (Medical): No    Lack of Transportation (Non-Medical): No  Physical Activity: Insufficiently Active (02/25/2023)   Exercise Vital Sign    Days of Exercise per Week: 3 days    Minutes of Exercise per Session: 30 min  Stress: Stress Concern Present (02/25/2023)   [employer] of Occupational Health - Occupational Stress Questionnaire    Feeling of Stress : To some extent  Social Connections: Socially Isolated (02/25/2023)   Social Connection and Isolation Panel    Frequency of Communication with Friends and Family: Twice a week    Frequency of Social Gatherings with Friends and Family: Once a week    Attends Religious Services: Never    Database Administrator or Organizations: No    Attends Engineer, Structural: Not on file    Marital Status: Separated   Past Medical  History:  Past Medical History:  Diagnosis Date   Acne    Allergy    Anxiety    Asthma    Chronic headaches    Depression    GERD (gastroesophageal reflux disease)     Past Surgical History:  Procedure Laterality Date   OPEN REDUCTION INTERNAL FIXATION (ORIF) METACARPAL Right 11/19/2018   Procedure: Right small finger metacarpal open malunion repair;  Surgeon: Shari Easter, MD;  Location: [facility];  Service: Orthopedics;  Laterality: Right;  90 minutes   TYMPANOSTOMY TUBE PLACEMENT Bilateral    placed age 13 have fallen out   UPPER GASTROINTESTINAL ENDOSCOPY     WISDOM TOOTH EXTRACTION  04/04/2021  Current Medications: Current Facility-Administered Medications  Medication Dose Route Frequency Provider Last Rate Last Admin   acetaminophen  (TYLENOL ) tablet 650 mg  650 mg Oral Q6H PRN Allen, Tina L, FNP   650 mg at 01/15/24 0919   alum & mag hydroxide-simeth (MAALOX/MYLANTA) 200-200-20 MG/5ML suspension 30 mL  30 mL Oral Q4H PRN Allen, Tina L, FNP       ARIPiprazole (ABILIFY) tablet 2 mg  2 mg Oral Daily Smith, Annie B, NP   2 mg at 01/15/24 9185   buPROPion  (WELLBUTRIN  XL) 24 hr tablet 150 mg  150 mg Oral Daily Donye Campanelli L, PA-C   150 mg at 01/15/24 0815   haloperidol  (HALDOL ) tablet 5 mg  5 mg Oral TID PRN Dasie Ellouise CROME, FNP       And   diphenhydrAMINE  (BENADRYL ) capsule 50 mg  50 mg Oral TID PRN Allen, Tina L, FNP       haloperidol  lactate (HALDOL ) injection 5 mg  5 mg Intramuscular TID PRN Dasie Ellouise CROME, FNP       And   diphenhydrAMINE  (BENADRYL ) injection 50 mg  50 mg Intramuscular TID PRN Dasie Ellouise CROME, FNP       And   LORazepam  (ATIVAN ) injection 2 mg  2 mg Intramuscular TID PRN Allen, Tina L, FNP       haloperidol  lactate (HALDOL ) injection 10 mg  10 mg Intramuscular TID PRN Dasie Ellouise CROME, FNP       And   diphenhydrAMINE  (BENADRYL ) injection 50 mg  50 mg Intramuscular TID PRN Dasie Ellouise CROME, FNP       And   LORazepam  (ATIVAN ) injection 2 mg  2 mg  Intramuscular TID PRN Allen, Tina L, FNP       hydrOXYzine  (ATARAX ) tablet 25 mg  25 mg Oral TID PRN Allen, Tina L, FNP   25 mg at 01/12/24 2106   magnesium  hydroxide (MILK OF MAGNESIA) suspension 30 mL  30 mL Oral Daily PRN Dasie Ellouise CROME, FNP       traZODone  (DESYREL ) tablet 50 mg  50 mg Oral QHS PRN Allen, Tina L, FNP   50 mg at 01/13/24 2053    Lab Results:  No results found for this or any previous visit (from the past 48 hours).   Blood Alcohol level:  Lab Results  Component Value Date   [facility] <15 01/12/2024   ETH <10 06/20/2023    Metabolic Disorder Labs: Lab Results  Component Value Date   HGBA1C 4.7 (L) 06/20/2023   MPG 88.19 06/20/2023   MPG 93.93 03/04/2023   Lab Results  Component Value Date   PROLACTIN 12.3 01/12/2024   Lab Results  Component Value Date   CHOL 231 (H) 06/20/2023   TRIG 221 (H) 06/20/2023   HDL 46 06/20/2023   CHOLHDL 5.0 06/20/2023   VLDL 44 (H) 06/20/2023   LDLCALC 141 (H) 06/20/2023    Physical Findings: AIMS:  , ,  ,  ,    CIWA:    COWS:      Psychiatric Specialty Exam:  Presentation  General Appearance:  Disheveled  Eye Contact: Good  Speech: Clear and Coherent; Normal Rate  Speech Volume: Normal    Mood and Affect  Mood: Depressed  Affect: Congruent   Thought Process  Thought Processes: Coherent; Goal Directed; Linear  Orientation:Full (Time, Place and Person)  Thought Content:Logical  Hallucinations: Visual  Ideas of Reference:None  Suicidal Thoughts: Yes, without intent, without plan  Homicidal Thoughts: No  Sensorium  Memory: Immediate Good; Recent Good  Judgment: Fair  Insight: Fair   Executive Functions  Concentration: Good  Attention Span: Good  Recall: Metta Abe of Knowledge: Fair  Language: Fair   Psychomotor Activity  Psychomotor Activity: Normal Musculoskeletal: Strength & Muscle Tone: within normal limits Gait & Station: normal Assets   Assets: Manufacturing Systems Engineer; Desire for Improvement; Physical Health; Social Support; Resilience; Vocational/Educational; Financial Resources/Insurance    Physical Exam: Physical Exam ROS Blood pressure 120/73, pulse 87, temperature 98.8 F (37.1 C), temperature source Oral, resp. rate 18, height 5' 10 (1.778 m), weight 69.9 kg, SpO2 99%. Body mass index is 22.1 kg/m.  Diagnosis: Principal Problem:   MDD (major depressive disorder), recurrent episode, severe (HCC)   PLAN: Safety and Monitoring:  -- Voluntary admission to inpatient psychiatric unit for safety, stabilization and treatment  -- Daily contact with patient to assess and evaluate symptoms and progress in treatment  -- Patient's case to be discussed in multi-disciplinary team meeting  -- Observation Level : q15 minute checks  -- Vital signs:  q12 hours  -- Precautions: suicide, elopement, and assault -- Encouraged patient to participate in unit milieu and in scheduled group therapies   2. Psychiatric Treatment:  Scheduled Medications: Discontinued Lexapro  due to past ineffectiveness at 20 mg dose per patient report Wellbutrin  XL 150 mg once daily, patient reports this was previously effective and well-tolerated  Increase Abilify to 5 mg once daily   -- The risks/benefits/side-effects/alternatives to this medication were discussed in detail with the patient and time was given for questions. The patient consents to medication trial.   3. Medical Issues Being Addressed:  Naproxen  250 mg twice daily for left hip pain, will continue to monitor    4. Discharge Planning:   -- Social work and case management to assist with discharge planning and identification of hospital follow-up needs prior to discharge  -- Estimated LOS: 5-7 days  Camelia LITTIE Lukes, PA-C 01/15/2024, 4:46 PM

## 2024-01-15 NOTE — Plan of Care (Signed)

## 2024-01-15 NOTE — Group Note (Signed)
 Date:  01/15/2024 Time:  9:02 PM  Group Topic/Focus:  Wrap-Up Group:   The focus of this group is to help patients review their daily goal of treatment and discuss progress on daily workbooks.    Participation Level:  Active  Participation Quality:  Appropriate and Attentive  Affect:  Appropriate  Cognitive:  Alert and Appropriate  Insight: Appropriate and Good  Engagement in Group:  Engaged  Modes of Intervention:  Activity  Additional Comments:     Christopher Forbes 01/15/2024, 9:02 PM

## 2024-01-15 NOTE — Progress Notes (Signed)
   01/15/24 2011  Psych Admission Type (Psych Patients Only)  Admission Status Voluntary  Psychosocial Assessment  Patient Complaints Anxiety;Depression  Eye Contact Avoids  Facial Expression Flat  Affect Depressed  Speech Logical/coherent  Interaction Assertive  Motor Activity Slow  Appearance/Hygiene Improved;Layered clothes  Behavior Characteristics Cooperative;Appropriate to situation;Calm  Mood Sad;Depressed  Aggressive Behavior  Effect No apparent injury  Thought Process  Coherency WDL  Content Preoccupation;Religiosity;WDL  Delusions None reported or observed  Perception WDL  Hallucination None reported or observed  Judgment WDL  Confusion None  Danger to Self  Current suicidal ideation? Denies  Self-Injurious Behavior No self-injurious ideation or behavior indicators observed or expressed   Agreement Not to Harm Self Yes   Patient alert and oriented x 4, denies SI/HI/AVH interacting appropriately with peers and staff, affect is flat and congruent with mood, compliant with medication no distress noted. 15 minutes safety checks maintained will continue to monitor

## 2024-01-15 NOTE — Progress Notes (Signed)
 Pt reported 7/10 left leg pain on his self-assessment.  RN approached pt to ask him if hed like some Tylenol .  Pt was found in the dayroom with left knee completely bent, with leg bent completely underneath him, sitting with his full weight on lower leg.  When RN mentioned his chosen position, pt stated that he was trying to keep it warm and that he injured it at a park and later was checked out by MD who told him there was no intervention that could be done.  Pt also stated that there was a name for his injury that he could not recall.  *See MAR/med given

## 2024-01-15 NOTE — Progress Notes (Signed)
   01/15/24 0814  Psych Admission Type (Psych Patients Only)  Admission Status Voluntary  Psychosocial Assessment  Patient Complaints Anxiety;Depression  Eye Contact Avoids  Facial Expression Flat  Affect Depressed  Speech Logical/coherent  Interaction Assertive  Motor Activity Other (Comment) (WNL)  Appearance/Hygiene Layered clothes  Behavior Characteristics Cooperative  Mood Depressed  Aggressive Behavior  Effect No apparent injury  Thought Process  Coherency WDL  Content Preoccupation  Delusions None reported or observed  Perception Hallucinations  Hallucination Auditory;Visual  Judgment Impaired  Confusion None  Danger to Self  Current suicidal ideation? Passive (Pt stated, Im still having thoughts a little bit; avoid eye contact)  Self-Injurious Behavior Self-injurious ideation verbalized  Agreement Not to Harm Self Yes  Description of Agreement Verbal  Danger to Others  Danger to Others None reported or observed

## 2024-01-15 NOTE — Group Note (Signed)
 [facility] LCSW Group Therapy Note   Group Date: 01/15/2024 Start Time: 1300 End Time: 1400   Type of Therapy/Topic:  Group Therapy:  Emotion Regulation  Participation Level:  None    Description of Group:    The purpose of this group is to assist patients in learning to regulate negative emotions and experience positive emotions. Patients will be guided to discuss ways in which they have been vulnerable to their negative emotions. These vulnerabilities will be juxtaposed with experiences of positive emotions or situations, and patients challenged to use positive emotions to combat negative ones. Special emphasis will be placed on coping with negative emotions in conflict situations, and patients will process healthy conflict resolution skills.  Therapeutic Goals: Patient will identify two positive emotions or experiences to reflect on in order to balance out negative emotions:  Patient will label two or more emotions that they find the most difficult to experience:  Patient will be able to demonstrate positive conflict resolution skills through discussion or role plays:   Summary of Patient Progress: Patient was present for the entirety of the group. However, outside of the icebreaker pt did not participate in the larger group discussion. Behavior was appropriate. Insight remains questionable.    Therapeutic Modalities:   Cognitive Behavioral Therapy Feelings Identification Dialectical Behavioral Therapy   Nadara JONELLE Fam, LCSW

## 2024-01-15 NOTE — Group Note (Signed)
 Date:  01/15/2024 Time:  3:02 PM  Group Topic/Focus:  Wellness Toolbox:   The focus of this group is to discuss various aspects of wellness, balancing those aspects and exploring ways to increase the ability to experience wellness.  Patients will create a wellness toolbox for use upon discharge.    Participation Level:  Active  Participation Quality:  Appropriate  Affect:  Appropriate  Cognitive:  Appropriate  Insight: Appropriate  Engagement in Group:  Engaged  Modes of Intervention:  Activity and Socialization  Additional Comments:    Deitra Clap Yuvia Plant 01/15/2024, 3:02 PM

## 2024-01-15 NOTE — Plan of Care (Signed)
  Problem: Activity: Goal: Interest or engagement in activities will improve Outcome: Progressing   Problem: Safety: Goal: Periods of time without injury will increase Outcome: Progressing

## 2024-01-15 NOTE — Group Note (Signed)
 Date:  01/15/2024 Time:  11:04 AM  Group Topic/Focus:  Icebreaker Group: The focus of the group is to explain the functions and rules of the unit and also allow patients to introduce themselves to each other and name a positive feature about themselves.  Explains the roles and duties of every staff member on the milieu and how the each day will be structured for their care.     Participation Level:  Active  Participation Quality:  Appropriate  Affect:  Appropriate  Cognitive:  Appropriate  Insight: Appropriate  Engagement in Group:  Engaged  Modes of Intervention:  Activity  Additional Comments:    Christopher Forbes 01/15/2024, 11:04 AM

## 2024-01-16 MED ORDER — BUPROPION HCL ER (XL) 150 MG PO TB24
300.0000 mg | ORAL_TABLET | Freq: Every day | ORAL | Status: DC
  Administered 2024-01-17 – 2024-01-23 (×7): 300 mg via ORAL
  Filled 2024-01-16 (×8): qty 2

## 2024-01-16 NOTE — Progress Notes (Signed)
   01/16/24 2020  Psych Admission Type (Psych Patients Only)  Admission Status Voluntary  Psychosocial Assessment  Patient Complaints Anxiety;Depression  Eye Contact Brief  Facial Expression Flat  Affect Depressed  Speech Logical/coherent  Interaction Assertive  Motor Activity Slow  Appearance/Hygiene Unremarkable  Behavior Characteristics Cooperative;Appropriate to situation  Mood Depressed  Aggressive Behavior  Effect No apparent injury  Thought Process  Coherency WDL  Content Preoccupation  Delusions None reported or observed  Perception Hallucinations  Hallucination Visual  Judgment Impaired  Confusion None  Danger to Self  Current suicidal ideation? Passive  Agreement Not to Harm Self Yes  Description of Agreement verbal  Danger to Others  Danger to Others None reported or observed

## 2024-01-16 NOTE — Plan of Care (Signed)
   Problem: Education: Goal: Knowledge of Christopher Forbes [facility] Education information/materials will improve Outcome: Progressing Goal: Emotional status will improve Outcome: Progressing Goal: Mental status will improve Outcome: Progressing Goal: Verbalization of understanding the information provided will improve Outcome: Progressing   Problem: Activity: Goal: Interest or engagement in activities will improve Outcome: Progressing Goal: Sleeping patterns will improve Outcome: Progressing   Problem: Coping: Goal: Ability to verbalize frustrations and anger appropriately will improve Outcome: Progressing Goal: Ability to demonstrate self-control will improve Outcome: Progressing

## 2024-01-16 NOTE — Group Note (Signed)
 [facility] LCSW Group Therapy Note   Group Date: 01/16/2024 Start Time: 1300 End Time: 1400   Type of Therapy/Topic:  Group Therapy:  Emotion Regulation  Participation Level:  Active   Mood: Appropriate   Description of Group:    The purpose of this group is to assist patients in learning to regulate negative emotions and experience positive emotions. Patients will be guided to discuss ways in which they have been vulnerable to their negative emotions. These vulnerabilities will be juxtaposed with experiences of positive emotions or situations, and patients challenged to use positive emotions to combat negative ones. Special emphasis will be placed on coping with negative emotions in conflict situations, and patients will process healthy conflict resolution skills.  Therapeutic Goals: Patient will identify two positive emotions or experiences to reflect on in order to balance out negative emotions:  Patient will label two or more emotions that they find the most difficult to experience:  Patient will be able to demonstrate positive conflict resolution skills through discussion or role plays:   Summary of Patient Progress:   During group, patient and group explored the ways in which our thoughts can impact our feelings which impacts our behaviors. Group along with facilitator completed a thermometer activity where different areas of life were explored. Participants were asked to notate in which zone these areas exist in on their personal thermometers. The group then discussed coping skills, and safety plans to help better prepare for potential stressors and learn to better emotionally regulate.      Therapeutic Modalities:   Cognitive Behavioral Therapy Feelings Identification Dialectical Behavioral Therapy   Christopher CHRISTELLA Kerns, LCSW

## 2024-01-16 NOTE — Plan of Care (Signed)
  Problem: Education: Goal: Emotional status will improve Outcome: Progressing   Problem: Education: Goal: Mental status will improve Outcome: Progressing   Problem: Coping: Goal: Ability to verbalize frustrations and anger appropriately will improve Outcome: Progressing   Problem: Health Behavior/Discharge Planning: Goal: Compliance with treatment plan for underlying cause of condition will improve Outcome: Progressing   Problem: Physical Regulation: Goal: Ability to maintain clinical measurements within normal limits will improve Outcome: Progressing   Problem: Safety: Goal: Periods of time without injury will increase Outcome: Progressing

## 2024-01-16 NOTE — Progress Notes (Signed)
   01/16/24 1800  Psych Admission Type (Psych Patients Only)  Admission Status Voluntary  Psychosocial Assessment  Patient Complaints Anxiety;Depression  Eye Contact Brief  Facial Expression Flat  Affect Depressed  Speech Logical/coherent  Interaction Assertive  Motor Activity Slow  Appearance/Hygiene Unremarkable  Behavior Characteristics Cooperative;Appropriate to situation  Mood Depressed;Pleasant  Aggressive Behavior  Effect No apparent injury  Thought Process  Coherency WDL  Content Preoccupation  Delusions None reported or observed  Perception Hallucinations  Hallucination Visual (Reported only visual hallucinations today--shadows moving quickly through field of vision)  Judgment Impaired  Confusion None  Danger to Self  Current suicidal ideation? Passive (States that his coping skills are working to keep him from dwelling too long on the thoughts)  Self-Injurious Behavior No self-injurious ideation or behavior indicators observed or expressed   Agreement Not to Harm Self Yes  Description of Agreement Verbal  Danger to Others  Danger to Others None reported or observed

## 2024-01-16 NOTE — Group Note (Signed)
 Date:  01/16/2024 Time:  8:58 PM  Group Topic/Focus:  Coping With Mental Health Crisis:   The purpose of this group is to help patients identify strategies for coping with mental health crisis.  Group discusses possible causes of crisis and ways to manage them effectively.    Participation Level:  Active  Participation Quality:  Appropriate  Affect:  Appropriate  Cognitive:  Appropriate  Insight: Appropriate  Engagement in Group:  Limited  Modes of Intervention:  Education  Additional Comments:    Marella Vanderpol L 01/16/2024, 8:58 PM

## 2024-01-16 NOTE — Progress Notes (Signed)
 [facility] MD Progress Note  01/16/2024 5:47 PM Christopher Forbes  MRN:  [id number]   Subjective:  Chart reviewed, case discussed in multidisciplinary meeting, patient seen during rounds.   10/30: On interview today, patient is noted to be calm and cooperative.  He reports taking hydroxyzine  last night but feels that it increased feelings of panic and anxiety.  Will discontinue hydroxyzine  for this reason.  He reports otherwise tolerating medication regimen well.  Patient rates depression as 5 out of 10 and anxiety is 4 out of 10 today.    He endorses suicidal ideation without intent or plan this morning but denies suicidal ideation at time of interview.  He states he is trying to keep his mind busy. He is agreeable to Wellbutrin  XL dose increase, states he was previously taking 300 mg with good effect and tolerability.  He denies HI/plan.  He denies auditory hallucinations.  He reports improved visual hallucinations, reports 1 episode of seeing a figure moving by the window today.  He reports hip pain has improved with naproxen .  He is no longer noted to be walking with a limp.  He denies redness or swelling of joints.  10/29: On interview today, patient is noted to be calm and cooperative, alert and oriented.  He is tolerating current medication regimen well without adverse effects.  He rates depression as 6 out of 10 and anxiety as 6 out of 10 today.  He endorses suicidal ideation without intent or plan.  He is able to contract for safety.  He has not been observed engaging in self-harm behaviors.  He denies HI/plan.  He endorses visual hallucinations but states these have somewhat improved.  He denies current auditory hallucinations.  He is agreeable to Abilify dose increased at this time.  Shortly after dinner today patient reported left hip pain to nurse, who then informed provider.  Provider returned to interview patient regarding pain.  Patient reports left hip pain that started around dinnertime this  evening.  He is noted to be walking with a limp.  He denies fall or acute trauma.  He denies previous injury to this site.  He denies pain in any other sites.  He reports history of arthritis but states this does not feel like a typical arthritis flare.  Vital signs are within normal limits. Per nursing report, patient requested narcotic pain medication and declined Tylenol  or ibuprofen . Will add naproxen  250 mg twice daily and continue to monitor. Patient advised to alert staff is pain is worsening, or if other symptoms such as swelling, increased warmth, or redness are noted.   10/28: On interview today, patient is noted to be interacting appropriately with peers in the milieu.  He returns to his room to engage in interview with provider.  He is noted to be calm and cooperative, alert and oriented.  When asked about suicidal ideation patient replies a little, and when asked about intent or plan, patient replies not yet.  Patient denies HI/plan.  He endorses visual hallucinations involving a black blur and shadow figures.  He endorses previous auditory hallucinations approximately 3 days ago but not currently.  Auditory hallucinations involve a deep, rough-sounding male voice calling patient's name.  He states he only ever hears name called and does not recognize the voice.  He denies command hallucinations.  He states he hears auditory hallucinations approximately twice every couple of days.  He rates depression at 7 out of 10 and anxiety at 6 out of 10 today.  He  is tolerating current medication regimen well without adverse effects.  He states he prefers to discontinue Lexapro  due to previous ineffectiveness at 20 mg dose.  He requests to restart Wellbutrin , which he states was effective in the past and well-tolerated.  He denies history of seizures, anorexia nervosa, or bulimia.   Sleep: Fair  Appetite:  Fair  Past Psychiatric History: see h&P Family History:  Family History  Problem Relation  Age of Onset   Migraines Mother    Diabetes Father    Hyperlipidemia Father    Hypertension Father    COPD Father    Asthma Father    Migraines Brother    Stroke Maternal Grandmother    Colon cancer Neg Hx    Esophageal cancer Neg Hx    Rectal cancer Neg Hx    Stomach cancer Neg Hx    Social History:  Social History   Substance and Sexual Activity  Alcohol Use No   Alcohol/week: 0.0 standard drinks of alcohol     Social History   Substance and Sexual Activity  Drug Use No    Social History   Socioeconomic History   Marital status: Single    Spouse name: Not on file   Number of children: Not on file   Years of education: Not on file   Highest education level: 11th grade  Occupational History   Occupation: consulting civil engineer  Tobacco Use   Smoking status: Some Days    Current packs/day: 0.25    Types: Cigarettes   Smokeless tobacco: Never   Tobacco comments:    1 -2 cigs a day for the past two months   Vaping Use   Vaping status: Former  Substance and Sexual Activity   Alcohol use: No    Alcohol/week: 0.0 standard drinks of alcohol   Drug use: No   Sexual activity: Yes    Partners: Male  Other Topics Concern   Not on file  Social History Narrative   Patient lives with brother step sister and step dad Transport Planner. Mom is involved. Right handed      06/21/2023 Pt states I live on the street, I don't live with my step dad   Social Drivers of Health   Financial Resource Strain: High Risk (02/25/2023)   Overall Financial Resource Strain (CARDIA)    Difficulty of Paying Living Expenses: Very hard  Food Insecurity: No Food Insecurity (01/12/2024)   Hunger Vital Sign    Worried About Running Out of Food in the Last Year: Never true    Ran Out of Food in the Last Year: Never true  Recent Concern: Food Insecurity - Food Insecurity Present (01/12/2024)   Hunger Vital Sign    Worried About Running Out of Food in the Last Year: Sometimes true    Ran Out of Food in the Last  Year: Sometimes true  Transportation Needs: No Transportation Needs (01/12/2024)   PRAPARE - Administrator, Civil Service (Medical): No    Lack of Transportation (Non-Medical): No  Physical Activity: Insufficiently Active (02/25/2023)   Exercise Vital Sign    Days of Exercise per Week: 3 days    Minutes of Exercise per Session: 30 min  Stress: Stress Concern Present (02/25/2023)   [employer] of Occupational Health - Occupational Stress Questionnaire    Feeling of Stress : To some extent  Social Connections: Socially Isolated (02/25/2023)   Social Connection and Isolation Panel    Frequency of Communication with Friends and Family: Twice a  week    Frequency of Social Gatherings with Friends and Family: Once a week    Attends Religious Services: Never    Database Administrator or Organizations: No    Attends Engineer, Structural: Not on file    Marital Status: Separated   Past Medical History:  Past Medical History:  Diagnosis Date   Acne    Allergy    Anxiety    Asthma    Chronic headaches    Depression    GERD (gastroesophageal reflux disease)     Past Surgical History:  Procedure Laterality Date   OPEN REDUCTION INTERNAL FIXATION (ORIF) METACARPAL Right 11/19/2018   Procedure: Right small finger metacarpal open malunion repair;  Surgeon: Shari Easter, MD;  Location: [facility];  Service: Orthopedics;  Laterality: Right;  90 minutes   TYMPANOSTOMY TUBE PLACEMENT Bilateral    placed age 2 have fallen out   UPPER GASTROINTESTINAL ENDOSCOPY     WISDOM TOOTH EXTRACTION  04/04/2021    Current Medications: Current Facility-Administered Medications  Medication Dose Route Frequency Provider Last Rate Last Admin   acetaminophen  (TYLENOL ) tablet 650 mg  650 mg Oral Q6H PRN Dasie Ellouise CROME, FNP   650 mg at 01/15/24 0919   alum & mag hydroxide-simeth (MAALOX/MYLANTA) 200-200-20 MG/5ML suspension 30 mL  30 mL Oral Q4H PRN Allen, Tina L, FNP        ARIPiprazole (ABILIFY) tablet 5 mg  5 mg Oral Daily Janal Haak L, PA-C   5 mg at 01/16/24 0915   [START ON 01/17/2024] buPROPion  (WELLBUTRIN  XL) 24 hr tablet 300 mg  300 mg Oral Daily Yasmen Cortner L, PA-C       haloperidol  (HALDOL ) tablet 5 mg  5 mg Oral TID PRN Dasie Ellouise CROME, FNP       And   diphenhydrAMINE  (BENADRYL ) capsule 50 mg  50 mg Oral TID PRN Dasie Ellouise CROME, FNP       haloperidol  lactate (HALDOL ) injection 5 mg  5 mg Intramuscular TID PRN Dasie Ellouise CROME, FNP       And   diphenhydrAMINE  (BENADRYL ) injection 50 mg  50 mg Intramuscular TID PRN Dasie Ellouise CROME, FNP       And   LORazepam  (ATIVAN ) injection 2 mg  2 mg Intramuscular TID PRN Dasie Ellouise CROME, FNP       haloperidol  lactate (HALDOL ) injection 10 mg  10 mg Intramuscular TID PRN Dasie Ellouise CROME, FNP       And   diphenhydrAMINE  (BENADRYL ) injection 50 mg  50 mg Intramuscular TID PRN Dasie Ellouise CROME, FNP       And   LORazepam  (ATIVAN ) injection 2 mg  2 mg Intramuscular TID PRN Allen, Tina L, FNP       magnesium  hydroxide (MILK OF MAGNESIA) suspension 30 mL  30 mL Oral Daily PRN Dasie Ellouise CROME, FNP       naproxen  (NAPROSYN ) tablet 250 mg  250 mg Oral BID WC Anne Boltz L, PA-C   250 mg at 01/16/24 1744   traZODone  (DESYREL ) tablet 50 mg  50 mg Oral QHS PRN Dasie Ellouise CROME, FNP   50 mg at 01/15/24 2102    Lab Results:  No results found for this or any previous visit (from the past 48 hours).   Blood Alcohol level:  Lab Results  Component Value Date   [facility] <15 01/12/2024   ETH <10 06/20/2023    Metabolic Disorder Labs: Lab Results  Component Value  Date   HGBA1C 4.7 (L) 06/20/2023   MPG 88.19 06/20/2023   MPG 93.93 03/04/2023   Lab Results  Component Value Date   PROLACTIN 12.3 01/12/2024   Lab Results  Component Value Date   CHOL 231 (H) 06/20/2023   TRIG 221 (H) 06/20/2023   HDL 46 06/20/2023   CHOLHDL 5.0 06/20/2023   VLDL 44 (H) 06/20/2023   LDLCALC 141 (H) 06/20/2023    Physical Findings: AIMS:  , ,   ,  ,    CIWA:    COWS:      Psychiatric Specialty Exam:  Presentation  General Appearance:  Disheveled  Eye Contact: Good  Speech: Clear and Coherent; Normal Rate  Speech Volume: Normal    Mood and Affect  Mood: Depressed  Affect: Congruent   Thought Process  Thought Processes: Coherent; Goal Directed; Linear  Orientation:Full (Time, Place and Person)  Thought Content:Logical  Hallucinations: Visual  Ideas of Reference:None  Suicidal Thoughts: Yes, without intent, without plan  Homicidal Thoughts: No  Sensorium  Memory: Immediate Good; Recent Good  Judgment: Fair  Insight: Fair   Art Therapist  Concentration: Good  Attention Span: Good  Recall: Good  Fund of Knowledge: Fair  Language: Fair   Psychomotor Activity  Psychomotor Activity: Normal Musculoskeletal: Strength & Muscle Tone: within normal limits Gait & Station: normal Assets  Assets: Manufacturing Systems Engineer; Desire for Improvement; Physical Health; Social Support; Resilience; Vocational/Educational; Financial Resources/Insurance    Physical Exam: Physical Exam ROS Blood pressure 112/73, pulse 83, temperature 97.7 F (36.5 C), temperature source Oral, resp. rate 20, height 5' 10 (1.778 m), weight 69.9 kg, SpO2 99%. Body mass index is 22.1 kg/m.  Diagnosis: Principal Problem:   MDD (major depressive disorder), recurrent episode, severe (HCC)   PLAN: Safety and Monitoring:  -- Voluntary admission to inpatient psychiatric unit for safety, stabilization and treatment  -- Daily contact with patient to assess and evaluate symptoms and progress in treatment  -- Patient's case to be discussed in multi-disciplinary team meeting  -- Observation Level : q15 minute checks  -- Vital signs:  q12 hours  -- Precautions: suicide, elopement, and assault -- Encouraged patient to participate in unit milieu and in scheduled group therapies   2. Psychiatric Treatment:   Scheduled Medications: Discontinued Lexapro  due to past ineffectiveness at 20 mg dose per patient report Wellbutrin  XL 300 mg once daily, patient reports this was previously effective and well-tolerated  Abilify 5 mg once daily   -- The risks/benefits/side-effects/alternatives to this medication were discussed in detail with the patient and time was given for questions. The patient consents to medication trial.   3. Medical Issues Being Addressed:  Naproxen  250 mg twice daily for left hip pain, will continue to monitor    4. Discharge Planning:   -- Social work and case management to assist with discharge planning and identification of hospital follow-up needs prior to discharge  -- Estimated LOS: 5-7 days  Camelia LITTIE Lukes, PA-C 01/16/2024, 5:47 PM

## 2024-01-16 NOTE — Group Note (Signed)
 Recreation Therapy Group Note   Group Topic:Goal Setting  Group Date: 01/16/2024 Start Time: 1000 End Time: 1100 Facilitators: Celestia Jeoffrey BRAVO, LRT, CTRS Location: Craft Room  Group Description: Product/process Development Scientist. Patients were given many different magazines, a glue stick, markers, and a piece of cardstock paper. LRT and pts discussed the importance of having goals in life. LRT and pts discussed the difference between short-term and long-term goals, as well as what a SMART goal is. LRT encouraged pts to create a vision board, with images they picked and then cut out with safety scissors from the magazine, for themselves, that capture their short and long-term goals. LRT encouraged pts to show and explain their vision board to the group.   Goal Area(s) Addressed:  Patient will gain knowledge of short vs. long term goals.  Patient will identify goals for themselves. Patient will practice setting SMART goals.  Affect/Mood: Appropriate   Participation Level: Active and Engaged   Participation Quality: Independent   Behavior: Calm and Cooperative   Speech/Thought Process: Coherent   Insight: Good and Improved   Judgement: Good and Improved   Modes of Intervention: Art   Patient Response to Interventions:  Attentive, Engaged, Interested , and Receptive   Education Outcome:  Acknowledges education   Clinical Observations/Individualized Feedback: Christopher Forbes was active in their participation of session activities and group discussion. 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    Plan: Continue to engage patient in RT group sessions 2-3x/week.   Jeoffrey BRAVO Celestia, LRT, CTRS 01/16/2024 1:30 PM

## 2024-01-17 NOTE — Group Note (Signed)
 Date:  01/17/2024 Time:  9:10 PM  Group Topic/Focus:  Wellness Toolbox:   The focus of this group is to discuss various aspects of wellness, balancing those aspects and exploring ways to increase the ability to experience wellness.  Patients will create a wellness toolbox for use upon discharge.    Participation Level:  Active  Participation Quality:  Appropriate, Attentive, Sharing, and Supportive  Affect:  Appropriate  Cognitive:  Appropriate  Insight: Appropriate  Engagement in Group:  Engaged  Modes of Intervention:  Discussion  Additional Comments:     Kerri Katz 01/17/2024, 9:10 PM

## 2024-01-17 NOTE — Plan of Care (Signed)

## 2024-01-17 NOTE — Progress Notes (Signed)
 [facility] MD Progress Note  01/17/2024 5:26 PM Christopher Forbes  MRN:  [id number]   Subjective:  Chart reviewed, case discussed in multidisciplinary meeting, patient seen during rounds.   10/31: On interview today, patient is noted to be calm and cooperative, alert and oriented, and more talkative today.  He is able to discuss coping skills, crisis resources, and support system in his friend and brother.  Upon hospital discharge, patient intends to move to [place]  with a friend.  Patient is looking forward to this transition.  He reports stable sleep and appetite.  He rates depression as 6 out of 10 today.  He denies current anxiety.  He endorses suicidal ideation without intent or plan today though notes this has been improving overall.  He is able to contract for safety.  He denies HI/plan.  He reports improving visual hallucinations, stating he saw 1 shadow figure today and 1 black blur this morning.  He denies auditory hallucinations.  He reports tolerating current medication regimen well without adverse effects.  He reports hip pain is controlled with Tylenol  and naproxen , will continue to monitor.  10/30: On interview today, patient is noted to be calm and cooperative.  He reports taking hydroxyzine  last night but feels that it increased feelings of panic and anxiety.  Will discontinue hydroxyzine  for this reason.  He reports otherwise tolerating medication regimen well.  Patient rates depression as 5 out of 10 and anxiety is 4 out of 10 today.    He endorses suicidal ideation without intent or plan this morning but denies suicidal ideation at time of interview.  He states he is trying to keep his mind busy. He is agreeable to Wellbutrin  XL dose increase, states he was previously taking 300 mg with good effect and tolerability.  He denies HI/plan.  He denies auditory hallucinations.  He reports improved visual hallucinations, reports 1 episode of seeing a figure moving by the window today.  He reports hip  pain has improved with naproxen .  He is no longer noted to be walking with a limp.  He denies redness or swelling of joints.  10/29: On interview today, patient is noted to be calm and cooperative, alert and oriented.  He is tolerating current medication regimen well without adverse effects.  He rates depression as 6 out of 10 and anxiety as 6 out of 10 today.  He endorses suicidal ideation without intent or plan.  He is able to contract for safety.  He has not been observed engaging in self-harm behaviors.  He denies HI/plan.  He endorses visual hallucinations but states these have somewhat improved.  He denies current auditory hallucinations.  He is agreeable to Abilify dose increased at this time.  Shortly after dinner today patient reported left hip pain to nurse, who then informed provider.  Provider returned to interview patient regarding pain.  Patient reports left hip pain that started around dinnertime this evening.  He is noted to be walking with a limp.  He denies fall or acute trauma.  He denies previous injury to this site.  He denies pain in any other sites.  He reports history of arthritis but states this does not feel like a typical arthritis flare.  Vital signs are within normal limits. Per nursing report, patient requested narcotic pain medication and declined Tylenol  or ibuprofen . Will add naproxen  250 mg twice daily and continue to monitor. Patient advised to alert staff is pain is worsening, or if other symptoms such as swelling, increased warmth,  or redness are noted.   10/28: On interview today, patient is noted to be interacting appropriately with peers in the milieu.  He returns to his room to engage in interview with provider.  He is noted to be calm and cooperative, alert and oriented.  When asked about suicidal ideation patient replies a little, and when asked about intent or plan, patient replies not yet.  Patient denies HI/plan.  He endorses visual hallucinations involving a  black blur and shadow figures.  He endorses previous auditory hallucinations approximately 3 days ago but not currently.  Auditory hallucinations involve a deep, rough-sounding male voice calling patient's name.  He states he only ever hears name called and does not recognize the voice.  He denies command hallucinations.  He states he hears auditory hallucinations approximately twice every couple of days.  He rates depression at 7 out of 10 and anxiety at 6 out of 10 today.  He is tolerating current medication regimen well without adverse effects.  He states he prefers to discontinue Lexapro  due to previous ineffectiveness at 20 mg dose.  He requests to restart Wellbutrin , which he states was effective in the past and well-tolerated.  He denies history of seizures, anorexia nervosa, or bulimia.   Sleep: Fair  Appetite:  Fair  Past Psychiatric History: see h&P Family History:  Family History  Problem Relation Age of Onset   Migraines Mother    Diabetes Father    Hyperlipidemia Father    Hypertension Father    COPD Father    Asthma Father    Migraines Brother    Stroke Maternal Grandmother    Colon cancer Neg Hx    Esophageal cancer Neg Hx    Rectal cancer Neg Hx    Stomach cancer Neg Hx    Social History:  Social History   Substance and Sexual Activity  Alcohol Use No   Alcohol/week: 0.0 standard drinks of alcohol     Social History   Substance and Sexual Activity  Drug Use No    Social History   Socioeconomic History   Marital status: Single    Spouse name: Not on file   Number of children: Not on file   Years of education: Not on file   Highest education level: 11th grade  Occupational History   Occupation: consulting civil engineer  Tobacco Use   Smoking status: Some Days    Current packs/day: 0.25    Types: Cigarettes   Smokeless tobacco: Never   Tobacco comments:    1 -2 cigs a day for the past two months   Vaping Use   Vaping status: Former  Substance and Sexual Activity    Alcohol use: No    Alcohol/week: 0.0 standard drinks of alcohol   Drug use: No   Sexual activity: Yes    Partners: Male  Other Topics Concern   Not on file  Social History Narrative   Patient lives with brother step sister and step dad Transport Planner. Mom is involved. Right handed      06/21/2023 Pt states I live on the street, I don't live with my step dad   Social Drivers of Health   Financial Resource Strain: High Risk (02/25/2023)   Overall Financial Resource Strain (CARDIA)    Difficulty of Paying Living Expenses: Very hard  Food Insecurity: No Food Insecurity (01/12/2024)   Hunger Vital Sign    Worried About Running Out of Food in the Last Year: Never true    Ran Out of Food  in the Last Year: Never true  Recent Concern: Food Insecurity - Food Insecurity Present (01/12/2024)   Hunger Vital Sign    Worried About Running Out of Food in the Last Year: Sometimes true    Ran Out of Food in the Last Year: Sometimes true  Transportation Needs: No Transportation Needs (01/12/2024)   PRAPARE - Administrator, Civil Service (Medical): No    Lack of Transportation (Non-Medical): No  Physical Activity: Insufficiently Active (02/25/2023)   Exercise Vital Sign    Days of Exercise per Week: 3 days    Minutes of Exercise per Session: 30 min  Stress: Stress Concern Present (02/25/2023)   [employer] of Occupational Health - Occupational Stress Questionnaire    Feeling of Stress : To some extent  Social Connections: Socially Isolated (02/25/2023)   Social Connection and Isolation Panel    Frequency of Communication with Friends and Family: Twice a week    Frequency of Social Gatherings with Friends and Family: Once a week    Attends Religious Services: Never    Database Administrator or Organizations: No    Attends Engineer, Structural: Not on file    Marital Status: Separated   Past Medical History:  Past Medical History:  Diagnosis Date   Acne    Allergy     Anxiety    Asthma    Chronic headaches    Depression    GERD (gastroesophageal reflux disease)     Past Surgical History:  Procedure Laterality Date   OPEN REDUCTION INTERNAL FIXATION (ORIF) METACARPAL Right 11/19/2018   Procedure: Right small finger metacarpal open malunion repair;  Surgeon: Shari Easter, MD;  Location: [facility];  Service: Orthopedics;  Laterality: Right;  90 minutes   TYMPANOSTOMY TUBE PLACEMENT Bilateral    placed age 89 have fallen out   UPPER GASTROINTESTINAL ENDOSCOPY     WISDOM TOOTH EXTRACTION  04/04/2021    Current Medications: Current Facility-Administered Medications  Medication Dose Route Frequency Provider Last Rate Last Admin   acetaminophen  (TYLENOL ) tablet 650 mg  650 mg Oral Q6H PRN Dasie Ellouise CROME, FNP   650 mg at 01/17/24 1559   alum & mag hydroxide-simeth (MAALOX/MYLANTA) 200-200-20 MG/5ML suspension 30 mL  30 mL Oral Q4H PRN Dasie Ellouise CROME, FNP       ARIPiprazole (ABILIFY) tablet 5 mg  5 mg Oral Daily Kassidy Dockendorf L, PA-C   5 mg at 01/17/24 9171   buPROPion  (WELLBUTRIN  XL) 24 hr tablet 300 mg  300 mg Oral Daily Tim Wilhide L, PA-C   300 mg at 01/17/24 9172   haloperidol  (HALDOL ) tablet 5 mg  5 mg Oral TID PRN Dasie Ellouise CROME, FNP       And   diphenhydrAMINE  (BENADRYL ) capsule 50 mg  50 mg Oral TID PRN Dasie Ellouise CROME, FNP       haloperidol  lactate (HALDOL ) injection 5 mg  5 mg Intramuscular TID PRN Dasie Ellouise CROME, FNP       And   diphenhydrAMINE  (BENADRYL ) injection 50 mg  50 mg Intramuscular TID PRN Dasie Ellouise CROME, FNP       And   LORazepam  (ATIVAN ) injection 2 mg  2 mg Intramuscular TID PRN Dasie Ellouise CROME, FNP       haloperidol  lactate (HALDOL ) injection 10 mg  10 mg Intramuscular TID PRN Dasie Ellouise CROME, FNP       And   diphenhydrAMINE  (BENADRYL ) injection 50 mg  50 mg  Intramuscular TID PRN Dasie Ellouise CROME, FNP       And   LORazepam  (ATIVAN ) injection 2 mg  2 mg Intramuscular TID PRN Allen, Tina L, FNP       magnesium  hydroxide  (MILK OF MAGNESIA) suspension 30 mL  30 mL Oral Daily PRN Dasie Ellouise CROME, FNP       naproxen  (NAPROSYN ) tablet 250 mg  250 mg Oral BID WC Saydie Gerdts L, PA-C   250 mg at 01/17/24 1600   traZODone  (DESYREL ) tablet 50 mg  50 mg Oral QHS PRN Dasie Ellouise CROME, FNP   50 mg at 01/16/24 2109    Lab Results:  No results found for this or any previous visit (from the past 48 hours).   Blood Alcohol level:  Lab Results  Component Value Date   [facility] <15 01/12/2024   ETH <10 06/20/2023    Metabolic Disorder Labs: Lab Results  Component Value Date   HGBA1C 4.7 (L) 06/20/2023   MPG 88.19 06/20/2023   MPG 93.93 03/04/2023   Lab Results  Component Value Date   PROLACTIN 12.3 01/12/2024   Lab Results  Component Value Date   CHOL 231 (H) 06/20/2023   TRIG 221 (H) 06/20/2023   HDL 46 06/20/2023   CHOLHDL 5.0 06/20/2023   VLDL 44 (H) 06/20/2023   LDLCALC 141 (H) 06/20/2023    Physical Findings: AIMS:  , ,  ,  ,    CIWA:    COWS:      Psychiatric Specialty Exam:  Presentation  General Appearance:  Disheveled  Eye Contact: Good  Speech: Clear and Coherent; Normal Rate  Speech Volume: Normal    Mood and Affect  Mood: Depressed  Affect: Congruent   Thought Process  Thought Processes: Coherent; Goal Directed; Linear  Orientation:Full (Time, Place and Person)  Thought Content:Logical  Hallucinations: Visual  Ideas of Reference:None  Suicidal Thoughts: Yes, without intent, without plan  Homicidal Thoughts: No  Sensorium  Memory: Immediate Good; Recent Good  Judgment: Fair  Insight: Fair   Art Therapist  Concentration: Good  Attention Span: Good  Recall: Good  Fund of Knowledge: Fair  Language: Fair   Psychomotor Activity  Psychomotor Activity: Normal Musculoskeletal: Strength & Muscle Tone: within normal limits Gait & Station: normal Assets  Assets: Manufacturing Systems Engineer; Desire for Improvement; Physical Health; Social  Support; Resilience; Vocational/Educational; Financial Resources/Insurance    Physical Exam: Physical Exam ROS Blood pressure 111/74, pulse (!) 104, temperature 98.2 F (36.8 C), temperature source Oral, resp. rate 20, height 5' 10 (1.778 m), weight 69.9 kg, SpO2 98%. Body mass index is 22.1 kg/m.  Diagnosis: Principal Problem:   MDD (major depressive disorder), recurrent episode, severe (HCC)   PLAN: Safety and Monitoring:  -- Voluntary admission to inpatient psychiatric unit for safety, stabilization and treatment  -- Daily contact with patient to assess and evaluate symptoms and progress in treatment  -- Patient's case to be discussed in multi-disciplinary team meeting  -- Observation Level : q15 minute checks  -- Vital signs:  q12 hours  -- Precautions: suicide, elopement, and assault -- Encouraged patient to participate in unit milieu and in scheduled group therapies   2. Psychiatric Treatment:  Scheduled Medications: Discontinued Lexapro  due to past ineffectiveness at 20 mg dose per patient report Wellbutrin  XL 300 mg once daily, patient reports this was previously effective and well-tolerated  Abilify 5 mg once daily   -- The risks/benefits/side-effects/alternatives to this medication were discussed in detail with the patient and  time was given for questions. The patient consents to medication trial.   3. Medical Issues Being Addressed:  Naproxen  250 mg twice daily for left hip pain, will continue to monitor    4. Discharge Planning:   -- Social work and case management to assist with discharge planning and identification of hospital follow-up needs prior to discharge  -- Estimated LOS: 5-7 days  Camelia LITTIE Lukes, PA-C 01/17/2024, 5:26 PM

## 2024-01-17 NOTE — [facility] Counselor (Signed)
 [name] met with the patient to discuss aftercare.   Patient again declined aftercare stating that he would be picked up by a peer and they would then drive to [place] .  Sherryle Margo, MSW, LCSW 01/17/2024 8:48 AM

## 2024-01-17 NOTE — Progress Notes (Signed)
   01/17/24 0830  Psych Admission Type (Psych Patients Only)  Admission Status Voluntary  Psychosocial Assessment  Patient Complaints Anxiety;Depression (pt stated, I had a 20 minute panic attack yesterday and my chest still feels funny)  Eye Contact Brief  Facial Expression Flat  Affect Depressed  Speech Logical/coherent  Interaction Assertive  Motor Activity Slow  Appearance/Hygiene Unremarkable  Behavior Characteristics Cooperative;Appropriate to situation  Mood Depressed  Aggressive Behavior  Effect No apparent injury  Thought Process  Coherency WDL  Content Preoccupation  Delusions None reported or observed  Perception Hallucinations (pt stated still seeing shawdows)  Hallucination Visual  Judgment Impaired  Confusion None  Danger to Self  Current suicidal ideation? Passive (pt stated, its decreased)  Description of Suicide Plan no plan  Self-Injurious Behavior No self-injurious ideation or behavior indicators observed or expressed   Agreement Not to Harm Self Yes  Description of Agreement Verbal  Danger to Others  Danger to Others None reported or observed

## 2024-01-17 NOTE — [facility] Counselor (Signed)
 [name] attempted to contact the patient's mother, Brien Ards [phone].  Call was not answered and [name] left HIPAA compliant voicemail.  Sherryle Margo, MSW, LCSW 01/17/2024 8:49 AM

## 2024-01-17 NOTE — Group Note (Signed)
 Recreation Therapy Group Note   Group Topic:Leisure Education  Group Date: 01/17/2024 Start Time: 1030 End Time: 1140 Facilitators: Celestia Jeoffrey BRAVO, LRT, CTRS Location: Craft Room  Group Description: Leisure. Patients were given the option to choose from journaling, coloring, drawing, making origami, playing with playdoh, listening to music or singing karaoke. LRT and pts discussed the meaning of leisure, the importance of participating in leisure during their free time/when they're outside of the hospital, as well as how our leisure interests can also serve as coping skills.   Goal Area(s) Addressed:  Patient will identify a current leisure interest.  Patient will learn the definition of "leisure". Patient will practice making a positive decision. Patient will have the opportunity to try a new leisure activity. Patient will communicate with peers and LRT.    Affect/Mood: Appropriate   Participation Level: Active and Engaged   Participation Quality: Independent   Behavior: Calm and Cooperative   Speech/Thought Process: Coherent   Insight: Good   Judgement: Good   Modes of Intervention: Education, Exploration, and Music   Patient Response to Interventions:  Attentive, Engaged, Interested , and Receptive   Education Outcome:  Acknowledges education   Clinical Observations/Individualized Feedback: Christopher Forbes was active in their participation of session activities and group discussion. Pt identified video games and work on cars as things he does in his free time.    Plan: Continue to engage patient in RT group sessions 2-3x/week.   Jeoffrey BRAVO Celestia, LRT, CTRS 01/17/2024 1:22 PM

## 2024-01-17 NOTE — Group Note (Signed)
 Date:  01/17/2024 Time:  10:32 AM  Group Topic/Focus:  Goals Group:   The focus of this group is to help patients establish daily goals to achieve during treatment and discuss how the patient can incorporate goal setting into their daily lives to aide in recovery.    Participation Level:  Active  Participation Quality:  Appropriate  Affect:  Appropriate  Cognitive:  Alert  Insight: Appropriate  Engagement in Group:  Engaged  Modes of Intervention:  Activity, Discussion, and Education  Additional Comments:    Christopher Forbes 01/17/2024, 10:32 AM

## 2024-01-17 NOTE — Plan of Care (Signed)
   Problem: Education: Goal: Emotional status will improve Outcome: Progressing Goal: Mental status will improve Outcome: Progressing

## 2024-01-17 NOTE — [facility] Group Notes (Signed)
 Spirituality Group   Description: Participant directed exploration of values, beliefs and meaning   **Focus on Gratitude: Invite reflection on sources of gratitude (external/internal); goal to invite internal gratitude to foster 1) reconnection with life-giving activities 2) self-compassion.   Following a brief framework of chaplain's role and ground rules of group behavior, participants are invited to share concerns or questions that engage spiritual life. Emphasis placed on common themes and shared experiences and ways to make meaning and clarify living into one's values.   Theory/Process/Goal: Utilize the theoretical framework of group therapy established by Celena Kite, Relational Cultural Theory and Rogerian approaches to facilitate relational empathy and use of the "here and now" to foster reflection, self-awareness, and sharing.   Observations: Christopher Forbes was initially reserved but participated when prompted. Finds it challenging to name source of appreciation for self.  Christopher Forbes L. [name] HERO.Div

## 2024-01-18 NOTE — Group Note (Signed)
 Date:  01/18/2024 Time:  6:20 PM  Group Topic/Focus:  Wellness Toolbox:   The focus of this group is to discuss various aspects of wellness, balancing those aspects and exploring ways to increase the ability to experience wellness.  Patients will create a wellness toolbox for use upon discharge.    Participation Level:  Active  Participation Quality:  Appropriate  Affect:  Appropriate  Cognitive:  Appropriate  Insight: Appropriate  Engagement in Group:  Engaged  Modes of Intervention:  Activity and Socialization  Additional Comments:    Deitra Caron [place] 01/18/2024, 6:20 PM

## 2024-01-18 NOTE — Progress Notes (Signed)
 [facility] MD Progress Note  01/18/2024 9:39 PM Christopher Forbes  MRN:  [id number]   Subjective:  Chart reviewed, case discussed in multidisciplinary meeting, patient seen during rounds.   11/1: On interview today, patient is noted to be calm and cooperative.  He reports persistent suicidal ideation without intent or plan, though states this is improving overall.  He reports improving visual hallucinations.  He denies auditory hallucinations.  He denies HI/plan.  He rates depression as 5 out of 10 and anxiety is 5 out of 10.  Sleep and appetite remain stable.  Patient reports joint pain is well-controlled with naproxen .  10/31: On interview today, patient is noted to be calm and cooperative, alert and oriented, and more talkative today.  He is able to discuss coping skills, crisis resources, and support system in his friend and brother.  Upon hospital discharge, patient intends to move to [place]  with a friend.  Patient is looking forward to this transition.  He reports stable sleep and appetite.  He rates depression as 6 out of 10 today.  He denies current anxiety.  He endorses suicidal ideation without intent or plan today though notes this has been improving overall.  He is able to contract for safety.  He denies HI/plan.  He reports improving visual hallucinations, stating he saw 1 shadow figure today and 1 black blur this morning.  He denies auditory hallucinations.  He reports tolerating current medication regimen well without adverse effects.  He reports hip pain is controlled with Tylenol  and naproxen , will continue to monitor.  10/30: On interview today, patient is noted to be calm and cooperative.  He reports taking hydroxyzine  last night but feels that it increased feelings of panic and anxiety.  Will discontinue hydroxyzine  for this reason.  He reports otherwise tolerating medication regimen well.  Patient rates depression as 5 out of 10 and anxiety is 4 out of 10 today.    He endorses suicidal  ideation without intent or plan this morning but denies suicidal ideation at time of interview.  He states he is trying to keep his mind busy. He is agreeable to Wellbutrin  XL dose increase, states he was previously taking 300 mg with good effect and tolerability.  He denies HI/plan.  He denies auditory hallucinations.  He reports improved visual hallucinations, reports 1 episode of seeing a figure moving by the window today.  He reports hip pain has improved with naproxen .  He is no longer noted to be walking with a limp.  He denies redness or swelling of joints.  10/29: On interview today, patient is noted to be calm and cooperative, alert and oriented.  He is tolerating current medication regimen well without adverse effects.  He rates depression as 6 out of 10 and anxiety as 6 out of 10 today.  He endorses suicidal ideation without intent or plan.  He is able to contract for safety.  He has not been observed engaging in self-harm behaviors.  He denies HI/plan.  He endorses visual hallucinations but states these have somewhat improved.  He denies current auditory hallucinations.  He is agreeable to Abilify dose increased at this time.  Shortly after dinner today patient reported left hip pain to nurse, who then informed provider.  Provider returned to interview patient regarding pain.  Patient reports left hip pain that started around dinnertime this evening.  He is noted to be walking with a limp.  He denies fall or acute trauma.  He denies previous injury to this  site.  He denies pain in any other sites.  He reports history of arthritis but states this does not feel like a typical arthritis flare.  Vital signs are within normal limits. Per nursing report, patient requested narcotic pain medication and declined Tylenol  or ibuprofen . Will add naproxen  250 mg twice daily and continue to monitor. Patient advised to alert staff is pain is worsening, or if other symptoms such as swelling, increased warmth, or  redness are noted.   10/28: On interview today, patient is noted to be interacting appropriately with peers in the milieu.  He returns to his room to engage in interview with provider.  He is noted to be calm and cooperative, alert and oriented.  When asked about suicidal ideation patient replies a little, and when asked about intent or plan, patient replies not yet.  Patient denies HI/plan.  He endorses visual hallucinations involving a black blur and shadow figures.  He endorses previous auditory hallucinations approximately 3 days ago but not currently.  Auditory hallucinations involve a deep, rough-sounding male voice calling patient's name.  He states he only ever hears name called and does not recognize the voice.  He denies command hallucinations.  He states he hears auditory hallucinations approximately twice every couple of days.  He rates depression at 7 out of 10 and anxiety at 6 out of 10 today.  He is tolerating current medication regimen well without adverse effects.  He states he prefers to discontinue Lexapro  due to previous ineffectiveness at 20 mg dose.  He requests to restart Wellbutrin , which he states was effective in the past and well-tolerated.  He denies history of seizures, anorexia nervosa, or bulimia.   Sleep: Fair  Appetite:  Fair  Past Psychiatric History: see h&P Family History:  Family History  Problem Relation Age of Onset   Migraines Mother    Diabetes Father    Hyperlipidemia Father    Hypertension Father    COPD Father    Asthma Father    Migraines Brother    Stroke Maternal Grandmother    Colon cancer Neg Hx    Esophageal cancer Neg Hx    Rectal cancer Neg Hx    Stomach cancer Neg Hx    Social History:  Social History   Substance and Sexual Activity  Alcohol Use No   Alcohol/week: 0.0 standard drinks of alcohol     Social History   Substance and Sexual Activity  Drug Use No    Social History   Socioeconomic History   Marital status:  Single    Spouse name: Not on file   Number of children: Not on file   Years of education: Not on file   Highest education level: 11th grade  Occupational History   Occupation: consulting civil engineer  Tobacco Use   Smoking status: Some Days    Current packs/day: 0.25    Types: Cigarettes   Smokeless tobacco: Never   Tobacco comments:    1 -2 cigs a day for the past two months   Vaping Use   Vaping status: Former  Substance and Sexual Activity   Alcohol use: No    Alcohol/week: 0.0 standard drinks of alcohol   Drug use: No   Sexual activity: Yes    Partners: Male  Other Topics Concern   Not on file  Social History Narrative   Patient lives with brother step sister and step dad Transport Planner. Mom is involved. Right handed      06/21/2023 Pt states I live  on the street, I don't live with my step dad   Social Drivers of Corporate Investment Banker Strain: High Risk (02/25/2023)   Overall Financial Resource Strain (CARDIA)    Difficulty of Paying Living Expenses: Very hard  Food Insecurity: No Food Insecurity (01/12/2024)   Hunger Vital Sign    Worried About Running Out of Food in the Last Year: Never true    Ran Out of Food in the Last Year: Never true  Recent Concern: Food Insecurity - Food Insecurity Present (01/12/2024)   Hunger Vital Sign    Worried About Running Out of Food in the Last Year: Sometimes true    Ran Out of Food in the Last Year: Sometimes true  Transportation Needs: No Transportation Needs (01/12/2024)   PRAPARE - Administrator, Civil Service (Medical): No    Lack of Transportation (Non-Medical): No  Physical Activity: Insufficiently Active (02/25/2023)   Exercise Vital Sign    Days of Exercise per Week: 3 days    Minutes of Exercise per Session: 30 min  Stress: Stress Concern Present (02/25/2023)   [employer] of Occupational Health - Occupational Stress Questionnaire    Feeling of Stress : To some extent  Social Connections: Socially Isolated  (02/25/2023)   Social Connection and Isolation Panel    Frequency of Communication with Friends and Family: Twice a week    Frequency of Social Gatherings with Friends and Family: Once a week    Attends Religious Services: Never    Database Administrator or Organizations: No    Attends Engineer, Structural: Not on file    Marital Status: Separated   Past Medical History:  Past Medical History:  Diagnosis Date   Acne    Allergy    Anxiety    Asthma    Chronic headaches    Depression    GERD (gastroesophageal reflux disease)     Past Surgical History:  Procedure Laterality Date   OPEN REDUCTION INTERNAL FIXATION (ORIF) METACARPAL Right 11/19/2018   Procedure: Right small finger metacarpal open malunion repair;  Surgeon: Shari Easter, MD;  Location: [facility];  Service: Orthopedics;  Laterality: Right;  90 minutes   TYMPANOSTOMY TUBE PLACEMENT Bilateral    placed age 60 have fallen out   UPPER GASTROINTESTINAL ENDOSCOPY     WISDOM TOOTH EXTRACTION  04/04/2021    Current Medications: Current Facility-Administered Medications  Medication Dose Route Frequency Provider Last Rate Last Admin   acetaminophen  (TYLENOL ) tablet 650 mg  650 mg Oral Q6H PRN Dasie Ellouise CROME, FNP   650 mg at 01/17/24 1559   alum & mag hydroxide-simeth (MAALOX/MYLANTA) 200-200-20 MG/5ML suspension 30 mL  30 mL Oral Q4H PRN Dasie Ellouise CROME, FNP       ARIPiprazole (ABILIFY) tablet 5 mg  5 mg Oral Daily Lilyann Gravelle L, PA-C   5 mg at 01/18/24 9161   buPROPion  (WELLBUTRIN  XL) 24 hr tablet 300 mg  300 mg Oral Daily Eriona Kinchen L, PA-C   300 mg at 01/18/24 9161   haloperidol  (HALDOL ) tablet 5 mg  5 mg Oral TID PRN Dasie Ellouise CROME, FNP       And   diphenhydrAMINE  (BENADRYL ) capsule 50 mg  50 mg Oral TID PRN Dasie Ellouise CROME, FNP       haloperidol  lactate (HALDOL ) injection 5 mg  5 mg Intramuscular TID PRN Dasie Ellouise CROME, FNP       And   diphenhydrAMINE  (BENADRYL ) injection  50 mg  50 mg  Intramuscular TID PRN Dasie Ellouise CROME, FNP       And   LORazepam  (ATIVAN ) injection 2 mg  2 mg Intramuscular TID PRN Dasie Ellouise CROME, FNP       haloperidol  lactate (HALDOL ) injection 10 mg  10 mg Intramuscular TID PRN Dasie Ellouise CROME, FNP       And   diphenhydrAMINE  (BENADRYL ) injection 50 mg  50 mg Intramuscular TID PRN Dasie Ellouise CROME, FNP       And   LORazepam  (ATIVAN ) injection 2 mg  2 mg Intramuscular TID PRN Allen, Tina L, FNP       magnesium  hydroxide (MILK OF MAGNESIA) suspension 30 mL  30 mL Oral Daily PRN Dasie Ellouise CROME, FNP       naproxen  (NAPROSYN ) tablet 250 mg  250 mg Oral BID WC Klohe Lovering L, PA-C   250 mg at 01/18/24 1600   traZODone  (DESYREL ) tablet 50 mg  50 mg Oral QHS PRN Dasie Ellouise CROME, FNP   50 mg at 01/18/24 2108    Lab Results:  No results found for this or any previous visit (from the past 48 hours).   Blood Alcohol level:  Lab Results  Component Value Date   [facility] <15 01/12/2024   ETH <10 06/20/2023    Metabolic Disorder Labs: Lab Results  Component Value Date   HGBA1C 4.7 (L) 06/20/2023   MPG 88.19 06/20/2023   MPG 93.93 03/04/2023   Lab Results  Component Value Date   PROLACTIN 12.3 01/12/2024   Lab Results  Component Value Date   CHOL 231 (H) 06/20/2023   TRIG 221 (H) 06/20/2023   HDL 46 06/20/2023   CHOLHDL 5.0 06/20/2023   VLDL 44 (H) 06/20/2023   LDLCALC 141 (H) 06/20/2023    Physical Findings: AIMS:  , ,  ,  ,    CIWA:    COWS:      Psychiatric Specialty Exam:  Presentation  General Appearance:  Disheveled  Eye Contact: Good  Speech: Clear and Coherent; Normal Rate  Speech Volume: Normal    Mood and Affect  Mood: Depressed  Affect: Congruent   Thought Process  Thought Processes: Coherent; Goal Directed; Linear  Orientation:Full (Time, Place and Person)  Thought Content:Logical  Hallucinations: Visual  Ideas of Reference:None  Suicidal Thoughts: Yes, without intent, without plan  Homicidal Thoughts:  No  Sensorium  Memory: Immediate Good; Recent Good  Judgment: Fair  Insight: Fair   Art Therapist  Concentration: Good  Attention Span: Good  Recall: Good  Fund of Knowledge: Fair  Language: Fair   Psychomotor Activity  Psychomotor Activity: Normal Musculoskeletal: Strength & Muscle Tone: within normal limits Gait & Station: normal Assets  Assets: Manufacturing Systems Engineer; Desire for Improvement; Physical Health; Social Support; Resilience; Vocational/Educational; Financial Resources/Insurance    Physical Exam: Physical Exam ROS Blood pressure 122/77, pulse 90, temperature 97.6 F (36.4 C), temperature source Oral, resp. rate 20, height 5' 10 (1.778 m), weight 69.9 kg, SpO2 100%. Body mass index is 22.1 kg/m.  Diagnosis: Principal Problem:   MDD (major depressive disorder), recurrent episode, severe (HCC)   PLAN: Safety and Monitoring:  -- Voluntary admission to inpatient psychiatric unit for safety, stabilization and treatment  -- Daily contact with patient to assess and evaluate symptoms and progress in treatment  -- Patient's case to be discussed in multi-disciplinary team meeting  -- Observation Level : q15 minute checks  -- Vital signs:  q12 hours  -- Precautions:  suicide, elopement, and assault -- Encouraged patient to participate in unit milieu and in scheduled group therapies   2. Psychiatric Treatment:  Scheduled Medications: Discontinued Lexapro  due to past ineffectiveness at 20 mg dose per patient report Wellbutrin  XL 300 mg once daily, patient reports this was previously effective and well-tolerated  Abilify 5 mg once daily   -- The risks/benefits/side-effects/alternatives to this medication were discussed in detail with the patient and time was given for questions. The patient consents to medication trial.   3. Medical Issues Being Addressed:  Naproxen  250 mg twice daily for left hip pain, will continue to monitor    4.  Discharge Planning:   -- Social work and case management to assist with discharge planning and identification of hospital follow-up needs prior to discharge  -- Estimated LOS: 5-7 days  Camelia LITTIE Lukes, PA-C 01/18/2024, 9:39 PM

## 2024-01-18 NOTE — Plan of Care (Signed)
 Christopher Forbes is a 23 y.o. male patient. No diagnosis found. Past Medical History:  Diagnosis Date   Acne    Allergy    Anxiety    Asthma    Chronic headaches    Depression    GERD (gastroesophageal reflux disease)    Current Facility-Administered Medications  Medication Dose Route Frequency Provider Last Rate Last Admin   acetaminophen  (TYLENOL ) tablet 650 mg  650 mg Oral Q6H PRN Dasie Ellouise CROME, FNP   650 mg at 01/17/24 1559   alum & mag hydroxide-simeth (MAALOX/MYLANTA) 200-200-20 MG/5ML suspension 30 mL  30 mL Oral Q4H PRN Allen, Tina L, FNP       ARIPiprazole (ABILIFY) tablet 5 mg  5 mg Oral Daily Hunter, Crystal L, PA-C   5 mg at 01/17/24 9171   buPROPion  (WELLBUTRIN  XL) 24 hr tablet 300 mg  300 mg Oral Daily Hunter, Crystal L, PA-C   300 mg at 01/17/24 0827   haloperidol  (HALDOL ) tablet 5 mg  5 mg Oral TID PRN Dasie Ellouise CROME, FNP       And   diphenhydrAMINE  (BENADRYL ) capsule 50 mg  50 mg Oral TID PRN Dasie Ellouise CROME, FNP       haloperidol  lactate (HALDOL ) injection 5 mg  5 mg Intramuscular TID PRN Dasie Ellouise CROME, FNP       And   diphenhydrAMINE  (BENADRYL ) injection 50 mg  50 mg Intramuscular TID PRN Dasie Ellouise CROME, FNP       And   LORazepam  (ATIVAN ) injection 2 mg  2 mg Intramuscular TID PRN Dasie Ellouise CROME, FNP       haloperidol  lactate (HALDOL ) injection 10 mg  10 mg Intramuscular TID PRN Dasie Ellouise CROME, FNP       And   diphenhydrAMINE  (BENADRYL ) injection 50 mg  50 mg Intramuscular TID PRN Dasie Ellouise CROME, FNP       And   LORazepam  (ATIVAN ) injection 2 mg  2 mg Intramuscular TID PRN Allen, Tina L, FNP       magnesium  hydroxide (MILK OF MAGNESIA) suspension 30 mL  30 mL Oral Daily PRN Dasie Ellouise CROME, FNP       naproxen  (NAPROSYN ) tablet 250 mg  250 mg Oral BID WC Hunter, Crystal L, PA-C   250 mg at 01/17/24 1600   traZODone  (DESYREL ) tablet 50 mg  50 mg Oral QHS PRN Dasie Ellouise CROME, FNP   50 mg at 01/17/24 2117   Allergies  Allergen Reactions   Chicken Allergy Other (See Comments)     Tears stomach up   Egg Protein-Containing Drug Products Nausea And Vomiting   Principal Problem:   MDD (major depressive disorder), recurrent episode, severe (HCC)  Blood pressure 111/74, pulse (!) 104, temperature 98.2 F (36.8 C), temperature source Oral, resp. rate 20, height 5' 10 (1.778 m), weight 69.9 kg, SpO2 98%.  Subjective Objective Assessment & Plan  Christopher Forbes B Christopher Forbes 01/18/2024

## 2024-01-18 NOTE — Group Note (Signed)
 Date:  01/18/2024 Time:  2:10 PM  Group Topic/Focus:  Goals Group:   The focus of this group is to help patients establish daily goals to achieve during treatment and discuss how the patient can incorporate goal setting into their daily lives to aide in recovery. Wellness Toolbox:   The focus of this group is to discuss various aspects of wellness, balancing those aspects and exploring ways to increase the ability to experience wellness.  Patients will create a wellness toolbox for use upon discharge.    Participation Level:  Active  Participation Quality:  Appropriate  Affect:  Appropriate  Cognitive:  Appropriate  Insight: Appropriate  Engagement in Group:  Engaged  Modes of Intervention:  Activity, Education, and Socialization  Additional Comments:    Christopher Forbes 01/18/2024, 2:10 PM

## 2024-01-18 NOTE — BH IP Treatment Plan (Unsigned)
 Interdisciplinary Treatment and Diagnostic Plan Update  01/18/2024 Time of Session: 9:00 AM Christopher Forbes MRN: [id number]  Principal Diagnosis: MDD (major depressive disorder), recurrent episode, severe (HCC)  Secondary Diagnoses: Principal Problem:   MDD (major depressive disorder), recurrent episode, severe (HCC)   Current Medications:  Current Facility-Administered Medications  Medication Dose Route Frequency Provider Last Rate Last Admin   acetaminophen  (TYLENOL ) tablet 650 mg  650 mg Oral Q6H PRN Dasie Ellouise CROME, FNP   650 mg at 01/17/24 1559   alum & mag hydroxide-simeth (MAALOX/MYLANTA) 200-200-20 MG/5ML suspension 30 mL  30 mL Oral Q4H PRN Dasie Ellouise CROME, FNP       ARIPiprazole (ABILIFY) tablet 5 mg  5 mg Oral Daily Hunter, Crystal L, PA-C   5 mg at 01/18/24 9161   buPROPion  (WELLBUTRIN  XL) 24 hr tablet 300 mg  300 mg Oral Daily Hunter, Crystal L, PA-C   300 mg at 01/18/24 9161   haloperidol  (HALDOL ) tablet 5 mg  5 mg Oral TID PRN Dasie Ellouise CROME, FNP       And   diphenhydrAMINE  (BENADRYL ) capsule 50 mg  50 mg Oral TID PRN Allen, Tina L, FNP       haloperidol  lactate (HALDOL ) injection 5 mg  5 mg Intramuscular TID PRN Dasie Ellouise CROME, FNP       And   diphenhydrAMINE  (BENADRYL ) injection 50 mg  50 mg Intramuscular TID PRN Dasie Ellouise CROME, FNP       And   LORazepam  (ATIVAN ) injection 2 mg  2 mg Intramuscular TID PRN Dasie Ellouise CROME, FNP       haloperidol  lactate (HALDOL ) injection 10 mg  10 mg Intramuscular TID PRN Dasie Ellouise CROME, FNP       And   diphenhydrAMINE  (BENADRYL ) injection 50 mg  50 mg Intramuscular TID PRN Dasie Ellouise CROME, FNP       And   LORazepam  (ATIVAN ) injection 2 mg  2 mg Intramuscular TID PRN Allen, Tina L, FNP       magnesium  hydroxide (MILK OF MAGNESIA) suspension 30 mL  30 mL Oral Daily PRN Dasie Ellouise CROME, FNP       naproxen  (NAPROSYN ) tablet 250 mg  250 mg Oral BID WC Hunter, Crystal L, PA-C   250 mg at 01/18/24 9160   traZODone  (DESYREL ) tablet 50 mg  50 mg Oral QHS  PRN Allen, Tina L, FNP   50 mg at 01/17/24 2117   PTA Medications: Medications Prior to Admission  Medication Sig Dispense Refill Last Dose/Taking   albuterol  (VENTOLIN  HFA) 108 (90 Base) MCG/ACT inhaler Inhale 2 puffs into the lungs every 4 (four) hours as needed for wheezing or shortness of breath. (Patient not taking: Reported on 12/24/2023)      buPROPion  (WELLBUTRIN  XL) 300 MG 24 hr tablet Take 1 tablet (300 mg total) by mouth daily. (Patient not taking: Reported on 12/24/2023) 30 tablet 0    buPROPion  (WELLBUTRIN  XL) 300 MG 24 hr tablet Take 1 tablet (300 mg total) by mouth daily. APPT NEEDED FOR REFILLS. (Patient not taking: Reported on 12/24/2023) 30 tablet 0    escitalopram  (LEXAPRO ) 20 MG tablet Take 1 tablet (20 mg total) by mouth daily. APPT NEEDED FOR REFILLS (Patient not taking: Reported on 12/24/2023) 90 tablet 1    hydrOXYzine  (ATARAX ) 25 MG tablet Take 1 tablet (25 mg total) by mouth 3 (three) times daily as needed for anxiety. (Patient not taking: Reported on 12/24/2023) 30 tablet 0    omeprazole  (PRILOSEC)  40 MG capsule Take 1 capsule (40 mg total) by mouth in the morning and at bedtime. (Patient not taking: Reported on 12/24/2023) 60 capsule 0    ondansetron  (ZOFRAN -ODT) 4 MG disintegrating tablet Take 1 tablet (4 mg total) by mouth every 6 (six) hours as needed for nausea or vomiting. (Patient not taking: Reported on 12/24/2023) 20 tablet 0    traZODone  (DESYREL ) 50 MG tablet Take 1 tablet (50 mg total) by mouth at bedtime as needed for sleep. (Patient not taking: Reported on 12/24/2023) 30 tablet 0     Patient Stressors: Financial difficulties   Marital or family conflict    Patient Strengths: Other: patient declines to answer  Treatment Modalities: Medication Management, Group therapy, Case management,  1 to 1 session with clinician, Psychoeducation, Recreational therapy.   Physician Treatment Plan for Primary Diagnosis: MDD (major depressive disorder), recurrent episode,  severe (HCC) Long Term Goal(s): Improvement in symptoms so as ready for discharge   Short Term Goals: Ability to identify changes in lifestyle to reduce recurrence of condition will improve Ability to verbalize feelings will improve Ability to disclose and discuss suicidal ideas Ability to demonstrate self-control will improve Ability to identify and develop effective coping behaviors will improve  Medication Management: Evaluate patient's response, side effects, and tolerance of medication regimen.  Therapeutic Interventions: 1 to 1 sessions, Unit Group sessions and Medication administration.  Evaluation of Outcomes: Progressing  Physician Treatment Plan for Secondary Diagnosis: Principal Problem:   MDD (major depressive disorder), recurrent episode, severe (HCC)  Long Term Goal(s): Improvement in symptoms so as ready for discharge   Short Term Goals: Ability to identify changes in lifestyle to reduce recurrence of condition will improve Ability to verbalize feelings will improve Ability to disclose and discuss suicidal ideas Ability to demonstrate self-control will improve Ability to identify and develop effective coping behaviors will improve     Medication Management: Evaluate patient's response, side effects, and tolerance of medication regimen.  Therapeutic Interventions: 1 to 1 sessions, Unit Group sessions and Medication administration.  Evaluation of Outcomes: Progressing   RN Treatment Plan for Primary Diagnosis: MDD (major depressive disorder), recurrent episode, severe (HCC) Long Term Goal(s): Knowledge of disease and therapeutic regimen to maintain health will improve  Short Term Goals: Ability to verbalize frustration and anger appropriately will improve, Ability to demonstrate self-control, Ability to participate in decision making will improve, Ability to verbalize feelings will improve, Ability to disclose and discuss suicidal ideas, and Ability to identify and  develop effective coping behaviors will improve   Medication Management: RN will administer medications as ordered by provider, will assess and evaluate patient's response and provide education to patient for prescribed medication. RN will report any adverse and/or side effects to prescribing provider.  Therapeutic Interventions: 1 on 1 counseling sessions, Psychoeducation, Medication administration, Evaluate responses to treatment, Monitor vital signs and CBGs as ordered, Perform/monitor CIWA, COWS, AIMS and Fall Risk screenings as ordered, Perform wound care treatments as ordered.  Evaluation of Outcomes: Progressing   LCSW Treatment Plan for Primary Diagnosis: MDD (major depressive disorder), recurrent episode, severe (HCC) Long Term Goal(s): Safe transition to appropriate next level of care at discharge, Engage patient in therapeutic group addressing interpersonal concerns.  Short Term Goals: Engage patient in aftercare planning with referrals and resources, Increase social support, Increase ability to appropriately verbalize feelings, Increase emotional regulation, Facilitate acceptance of mental health diagnosis and concerns, Facilitate patient progression through stages of change regarding substance use diagnoses and concerns, Identify triggers associated  with mental health/substance abuse issues, and Increase skills for wellness and recovery   Therapeutic Interventions: Assess for all discharge needs, 1 to 1 time with Social worker, Explore available resources and support systems, Assess for adequacy in community support network, Educate family and significant other(s) on suicide prevention, Complete Psychosocial Assessment, Interpersonal group therapy.  Evaluation of Outcomes: Progressing   Progress in Treatment: Attending groups: Yes. 01/18/24 Update: Yes. Participating in groups: Yes. 01/18/24 Update: Yes. Taking medication as prescribed: Yes. 01/18/24 Update: Yes. Toleration  medication: Yes.01/18/24 Update: Yes. Family/Significant other contact made: No, will contact:  [name] to contact once permission is granted. 01/18/24 Update: No, patient declined.  Patient understands diagnosis: Yes. 01/18/24 Update: Yes. Discussing patient identified problems/goals with staff: Yes. 01/18/24 Update: Yes. Medical problems stabilized or resolved: Yes. 01/18/24 Update: Yes. Denies suicidal/homicidal ideation: Yes. 01/18/24 Update: Yes. Issues/concerns per patient self-inventory: Yes. 01/18/24 Update: No.  Other: None 01/18/24 Update: None.   New problem(s) identified: No, Describe:  None 01/18/24 Update: No, Describe: None.    New Short Term/Long Term Goal(s):detox, elimination of symptoms of psychosis, medication management for mood stabilization; elimination of SI thoughts; development of comprehensive mental wellness/sobriety plan.  Patient Goals:  Try to get my depression and anxiety under control. 01/18/24 Update: Patient goal to remain the same.    Discharge Plan or Barriers: [name] to assist with the development of appropriate discharge plan. 01/18/24 Update: [name] goal to remain the same.   Reason for Continuation of Hospitalization: Anxiety Depression Suicidal ideation   Estimated Length of Stay: 1-7 days 01/18/24 Update: TBD.   Last 3 [place] Suicide Severity Risk Score: Flowsheet Row Admission (Current) from 01/12/2024 in [facility]PATIENT BE[facility]ICINE Most recent reading at 01/12/2024  6:44 PM ED from 01/12/2024 in [facility] Most recent reading at 01/12/2024  3:34 PM ED from 12/16/2023 in [facility] Most recent reading at 12/16/2023  1:46 PM  C-SSRS RISK CATEGORY High Risk High Risk No Risk    Last PHQ 2/9 Scores:    12/30/2022    4:03 PM 11/20/2022   11:11 AM 10/09/2022    3:26 PM  Depression screen PHQ 2/9  Decreased Interest 1 1 2   Down, Depressed, Hopeless 1 2 2   PHQ - 2 Score  2 3 4   Altered sleeping 0 1 1  Tired, decreased energy 1 2 2   Change in appetite 0 2 3  Feeling bad or failure about yourself  1 1 3   Trouble concentrating 0 1 2  Moving slowly or fidgety/restless 0 1 1  Suicidal thoughts 0 0 0  PHQ-9 Score 4 11 16   Difficult doing work/chores Somewhat difficult Somewhat difficult Somewhat difficult    Scribe for Treatment Team: Alveta CHRISTELLA Kerns, LCSW 01/18/2024 12:50 PM

## 2024-01-18 NOTE — Group Note (Signed)
 Date:  01/18/2024 Time:  10:54 PM  Group Topic/Focus:  Emotional Education:   The focus of this group is to discuss what feelings/emotions are, and how they are experienced. Making Healthy Choices:   The focus of this group is to help patients identify negative/unhealthy choices they were using prior to admission and identify positive/healthier coping strategies to replace them upon discharge.    Participation Level:  Active  Participation Quality:  Appropriate and Attentive  Affect:  Appropriate  Cognitive:  Alert, Appropriate, and Oriented  Insight: Appropriate and Good  Engagement in Group:  Engaged  Modes of Intervention:  Discussion and Support  Additional Comments:  N/A  Butler LITTIE Gelineau 01/18/2024, 10:54 PM

## 2024-01-18 NOTE — Progress Notes (Signed)
   01/18/24 0959  Psych Admission Type (Psych Patients Only)  Admission Status Voluntary  Psychosocial Assessment  Patient Complaints Self-harm thoughts;Sleep disturbance  Eye Contact Fair  Facial Expression Animated  Affect Anxious  Speech Logical/coherent  Interaction Assertive  Motor Activity Slow  Appearance/Hygiene Unremarkable  Behavior Characteristics Cooperative;Appropriate to situation  Mood Pleasant;Anxious  Aggressive Behavior  Effect No apparent injury  Thought Process  Coherency WDL  Content WDL  Delusions None reported or observed  Perception WDL  Hallucination None reported or observed  Judgment Impaired  Confusion WDL  Danger to Self  Current suicidal ideation? Passive  Self-Injurious Behavior No self-injurious ideation or behavior indicators observed or expressed   Agreement Not to Harm Self Yes  Description of Agreement verbal  Danger to Others  Danger to Others None reported or observed

## 2024-01-18 NOTE — Group Note (Signed)
 Date:  01/18/2024 Time:  7:12 AM  Group Topic/Focus:  Activity Group: The focus of the group is to promote activity for the patients and encourage them to go outside to the courtyard and get some fresh air and exercise.    Participation Level:  Active  Participation Quality:  Appropriate  Affect:  Appropriate  Cognitive:  Appropriate  Insight: Appropriate  Engagement in Group:  Engaged  Modes of Intervention:  Activity  Additional Comments:    Christopher Forbes 01/18/2024, 7:12 AM

## 2024-01-18 NOTE — Plan of Care (Signed)
  Problem: Education: Goal: Emotional status will improve Outcome: Progressing Goal: Verbalization of understanding the information provided will improve Outcome: Progressing   Problem: Activity: Goal: Interest or engagement in activities will improve Outcome: Progressing Goal: Sleeping patterns will improve Outcome: Progressing   Problem: Coping: Goal: Ability to verbalize frustrations and anger appropriately will improve Outcome: Progressing Goal: Ability to demonstrate self-control will improve Outcome: Progressing   Problem: Health Behavior/Discharge Planning: Goal: Compliance with treatment plan for underlying cause of condition will improve Outcome: Progressing   Problem: Education: Goal: Mental status will improve Outcome: Not Progressing

## 2024-01-18 NOTE — Group Note (Deleted)
 Date:  01/18/2024 Time:  1:57 PM  Group Topic/Focus:  Building Self Esteem:   The Focus of this group is helping patients become aware of the effects of self-esteem on their lives, the things they and others do that enhance or undermine their self-esteem, seeing the relationship between their level of self-esteem and the choices they make and learning ways to enhance self-esteem.     Participation Level:  {BHH PARTICIPATION OZCZO:77735}  Participation Quality:  {BHH PARTICIPATION QUALITY:22265}  Affect:  {BHH AFFECT:22266}  Cognitive:  {BHH COGNITIVE:22267}  Insight: {BHH Insight2:20797}  Engagement in Group:  {BHH ENGAGEMENT IN HMNLE:77731}  Modes of Intervention:  {BHH MODES OF INTERVENTION:22269}  Additional Comments:  ***  Deitra Caron Mainland 01/18/2024, 1:57 PM

## 2024-01-19 MED ORDER — ARIPIPRAZOLE 10 MG PO TABS
10.0000 mg | ORAL_TABLET | Freq: Every day | ORAL | Status: DC
  Administered 2024-01-20 – 2024-01-23 (×4): 10 mg via ORAL
  Filled 2024-01-19 (×4): qty 1

## 2024-01-19 NOTE — Progress Notes (Signed)
   01/18/24 1956  Psych Admission Type (Psych Patients Only)  Admission Status Voluntary  Psychosocial Assessment  Patient Complaints None  Eye Contact Fair  Facial Expression Animated  Affect Depressed (Triggered by thoughts about his Ex.)  Speech Logical/coherent  Interaction Assertive  Motor Activity Other (Comment) (WDL)  Appearance/Hygiene Unremarkable  Behavior Characteristics Cooperative;Appropriate to situation  Mood Depressed  Aggressive Behavior  Effect No apparent injury  Thought Process  Coherency WDL  Content WDL  Delusions None reported or observed  Perception WDL  Hallucination None reported or observed  Judgment Limited  Confusion None  Danger to Self  Current suicidal ideation? Passive  Agreement Not to Harm Self Yes  Description of Agreement Verbal  Danger to Others  Danger to Others None reported or observed   Endorsed passive SI r/t thought about Ex girlfriend but will not process about thoughts.  Endorsed depression rated severe.  Remains care compliant

## 2024-01-19 NOTE — Progress Notes (Signed)
   01/19/24 2000  Psych Admission Type (Psych Patients Only)  Admission Status Voluntary  Psychosocial Assessment  Patient Complaints None  Eye Contact Fair  Facial Expression Animated  Affect Depressed  Speech Logical/coherent  Interaction Assertive  Motor Activity Slow  Appearance/Hygiene Unremarkable  Behavior Characteristics Cooperative;Appropriate to situation  Mood Sad;Depressed  Aggressive Behavior  Effect No apparent injury  Thought Process  Coherency WDL  Content WDL  Delusions None reported or observed  Perception WDL  Hallucination None reported or observed  Judgment Impaired  Confusion None  Danger to Self  Current suicidal ideation? Passive  Agreement Not to Harm Self Yes  Description of Agreement verbal  Danger to Others  Danger to Others None reported or observed

## 2024-01-19 NOTE — Plan of Care (Signed)
 ?  Problem: Education: ?Goal: Emotional status will improve ?Outcome: Progressing ?Goal: Mental status will improve ?Outcome: Progressing ?Goal: Verbalization of understanding the information provided will improve ?Outcome: Progressing ?  ?Problem: Activity: ?Goal: Sleeping patterns will improve ?Outcome: Progressing ?  ?

## 2024-01-19 NOTE — Progress Notes (Signed)
   01/19/24 0815  Psych Admission Type (Psych Patients Only)  Admission Status Voluntary  Psychosocial Assessment  Patient Complaints None  Eye Contact Fair  Facial Expression Animated  Affect Depressed  Speech Logical/coherent  Interaction Assertive  Motor Activity Other (Comment) (WNL)  Appearance/Hygiene Unremarkable  Behavior Characteristics Cooperative;Appropriate to situation  Mood Depressed  Thought Process  Coherency WDL  Content WDL  Delusions None reported or observed  Perception WDL  Hallucination None reported or observed  Judgment Impaired  Confusion None  Danger to Self  Current suicidal ideation? Passive  Agreement Not to Harm Self Yes  Description of Agreement verbal  Danger to Others  Danger to Others None reported or observed

## 2024-01-19 NOTE — Plan of Care (Signed)

## 2024-01-19 NOTE — Group Note (Signed)
 Date:  01/19/2024 Time:  8:40 PM  Group Topic/Focus:  Wellness Toolbox:   The focus of this group is to discuss various aspects of wellness, balancing those aspects and exploring ways to increase the ability to experience wellness.  Patients will create a wellness toolbox for use upon discharge. Wrap-Up Group:   The focus of this group is to help patients review their daily goal of treatment and discuss progress on daily workbooks.    Participation Level:  Active  Participation Quality:  Appropriate and Attentive  Affect:  Appropriate  Cognitive:  Alert and Appropriate  Insight: Appropriate and Good  Engagement in Group:  Engaged  Modes of Intervention:  Discussion and Support  Additional Comments:  N/A  Butler Christopher Forbes 01/19/2024, 8:40 PM

## 2024-01-19 NOTE — Progress Notes (Signed)
 [facility] MD Progress Note  01/19/2024 5:28 PM Christopher Forbes  MRN:  [id number]   Subjective:  Chart reviewed, case discussed in multidisciplinary meeting, patient seen during rounds.   11/2: On interview today, patient is noted to be pleasant, calm and cooperative.  He continues to report suicidal ideation without intent or plan though continues to state this is improving.  He is able to contract for safety.  He denies HI/plan.  He continues to report visual hallucinations but states these are also improving.  He denies auditory hallucinations.  He rates depression as 5 out of 10 and denies current anxiety.  He is tolerating current medication regimen well without adverse effects.  He is agreeable to Abilify dose increase.  He reports joint pain related to arthritis and states this continues to be well-controlled with naproxen .  11/1: On interview today, patient is noted to be calm and cooperative.  He reports persistent suicidal ideation without intent or plan, though states this is improving overall.  He reports improving visual hallucinations.  He denies auditory hallucinations.  He denies HI/plan.  He rates depression as 5 out of 10 and anxiety is 5 out of 10.  Sleep and appetite remain stable.  Patient reports joint pain is well-controlled with naproxen .  10/31: On interview today, patient is noted to be calm and cooperative, alert and oriented, and more talkative today.  He is able to discuss coping skills, crisis resources, and support system in his friend and brother.  Upon hospital discharge, patient intends to move to [place]  with a friend.  Patient is looking forward to this transition.  He reports stable sleep and appetite.  He rates depression as 6 out of 10 today.  He denies current anxiety.  He endorses suicidal ideation without intent or plan today though notes this has been improving overall.  He is able to contract for safety.  He denies HI/plan.  He reports improving visual hallucinations,  stating he saw 1 shadow figure today and 1 black blur this morning.  He denies auditory hallucinations.  He reports tolerating current medication regimen well without adverse effects.  He reports hip pain is controlled with Tylenol  and naproxen , will continue to monitor.  10/30: On interview today, patient is noted to be calm and cooperative.  He reports taking hydroxyzine  last night but feels that it increased feelings of panic and anxiety.  Will discontinue hydroxyzine  for this reason.  He reports otherwise tolerating medication regimen well.  Patient rates depression as 5 out of 10 and anxiety is 4 out of 10 today.    He endorses suicidal ideation without intent or plan this morning but denies suicidal ideation at time of interview.  He states he is trying to keep his mind busy. He is agreeable to Wellbutrin  XL dose increase, states he was previously taking 300 mg with good effect and tolerability.  He denies HI/plan.  He denies auditory hallucinations.  He reports improved visual hallucinations, reports 1 episode of seeing a figure moving by the window today.  He reports hip pain has improved with naproxen .  He is no longer noted to be walking with a limp.  He denies redness or swelling of joints.  10/29: On interview today, patient is noted to be calm and cooperative, alert and oriented.  He is tolerating current medication regimen well without adverse effects.  He rates depression as 6 out of 10 and anxiety as 6 out of 10 today.  He endorses suicidal ideation without intent or  plan.  He is able to contract for safety.  He has not been observed engaging in self-harm behaviors.  He denies HI/plan.  He endorses visual hallucinations but states these have somewhat improved.  He denies current auditory hallucinations.  He is agreeable to Abilify dose increased at this time.  Shortly after dinner today patient reported left hip pain to nurse, who then informed provider.  Provider returned to interview  patient regarding pain.  Patient reports left hip pain that started around dinnertime this evening.  He is noted to be walking with a limp.  He denies fall or acute trauma.  He denies previous injury to this site.  He denies pain in any other sites.  He reports history of arthritis but states this does not feel like a typical arthritis flare.  Vital signs are within normal limits. Per nursing report, patient requested narcotic pain medication and declined Tylenol  or ibuprofen . Will add naproxen  250 mg twice daily and continue to monitor. Patient advised to alert staff is pain is worsening, or if other symptoms such as swelling, increased warmth, or redness are noted.   10/28: On interview today, patient is noted to be interacting appropriately with peers in the milieu.  He returns to his room to engage in interview with provider.  He is noted to be calm and cooperative, alert and oriented.  When asked about suicidal ideation patient replies a little, and when asked about intent or plan, patient replies not yet.  Patient denies HI/plan.  He endorses visual hallucinations involving a black blur and shadow figures.  He endorses previous auditory hallucinations approximately 3 days ago but not currently.  Auditory hallucinations involve a deep, rough-sounding male voice calling patient's name.  He states he only ever hears name called and does not recognize the voice.  He denies command hallucinations.  He states he hears auditory hallucinations approximately twice every couple of days.  He rates depression at 7 out of 10 and anxiety at 6 out of 10 today.  He is tolerating current medication regimen well without adverse effects.  He states he prefers to discontinue Lexapro  due to previous ineffectiveness at 20 mg dose.  He requests to restart Wellbutrin , which he states was effective in the past and well-tolerated.  He denies history of seizures, anorexia nervosa, or bulimia.   Sleep: Fair  Appetite:   Fair  Past Psychiatric History: see h&P Family History:  Family History  Problem Relation Age of Onset   Migraines Mother    Diabetes Father    Hyperlipidemia Father    Hypertension Father    COPD Father    Asthma Father    Migraines Brother    Stroke Maternal Grandmother    Colon cancer Neg Hx    Esophageal cancer Neg Hx    Rectal cancer Neg Hx    Stomach cancer Neg Hx    Social History:  Social History   Substance and Sexual Activity  Alcohol Use No   Alcohol/week: 0.0 standard drinks of alcohol     Social History   Substance and Sexual Activity  Drug Use No    Social History   Socioeconomic History   Marital status: Single    Spouse name: Not on file   Number of children: Not on file   Years of education: Not on file   Highest education level: 11th grade  Occupational History   Occupation: student  Tobacco Use   Smoking status: Some Days    Current packs/day:  0.25    Types: Cigarettes   Smokeless tobacco: Never   Tobacco comments:    1 -2 cigs a day for the past two months   Vaping Use   Vaping status: Former  Substance and Sexual Activity   Alcohol use: No    Alcohol/week: 0.0 standard drinks of alcohol   Drug use: No   Sexual activity: Yes    Partners: Male  Other Topics Concern   Not on file  Social History Narrative   Patient lives with brother step sister and step dad Transport Planner. Mom is involved. Right handed      06/21/2023 Pt states I live on the street, I don't live with my step dad   Social Drivers of Health   Financial Resource Strain: High Risk (02/25/2023)   Overall Financial Resource Strain (CARDIA)    Difficulty of Paying Living Expenses: Very hard  Food Insecurity: No Food Insecurity (01/12/2024)   Hunger Vital Sign    Worried About Running Out of Food in the Last Year: Never true    Ran Out of Food in the Last Year: Never true  Recent Concern: Food Insecurity - Food Insecurity Present (01/12/2024)   Hunger Vital Sign    Worried  About Running Out of Food in the Last Year: Sometimes true    Ran Out of Food in the Last Year: Sometimes true  Transportation Needs: No Transportation Needs (01/12/2024)   PRAPARE - Administrator, Civil Service (Medical): No    Lack of Transportation (Non-Medical): No  Physical Activity: Insufficiently Active (02/25/2023)   Exercise Vital Sign    Days of Exercise per Week: 3 days    Minutes of Exercise per Session: 30 min  Stress: Stress Concern Present (02/25/2023)   [employer] of Occupational Health - Occupational Stress Questionnaire    Feeling of Stress : To some extent  Social Connections: Socially Isolated (02/25/2023)   Social Connection and Isolation Panel    Frequency of Communication with Friends and Family: Twice a week    Frequency of Social Gatherings with Friends and Family: Once a week    Attends Religious Services: Never    Database Administrator or Organizations: No    Attends Engineer, Structural: Not on file    Marital Status: Separated   Past Medical History:  Past Medical History:  Diagnosis Date   Acne    Allergy    Anxiety    Asthma    Chronic headaches    Depression    GERD (gastroesophageal reflux disease)     Past Surgical History:  Procedure Laterality Date   OPEN REDUCTION INTERNAL FIXATION (ORIF) METACARPAL Right 11/19/2018   Procedure: Right small finger metacarpal open malunion repair;  Surgeon: Shari Easter, MD;  Location: [facility];  Service: Orthopedics;  Laterality: Right;  90 minutes   TYMPANOSTOMY TUBE PLACEMENT Bilateral    placed age 56 have fallen out   UPPER GASTROINTESTINAL ENDOSCOPY     WISDOM TOOTH EXTRACTION  04/04/2021    Current Medications: Current Facility-Administered Medications  Medication Dose Route Frequency Provider Last Rate Last Admin   acetaminophen  (TYLENOL ) tablet 650 mg  650 mg Oral Q6H PRN Dasie Ellouise CROME, FNP   650 mg at 01/17/24 1559   alum & mag hydroxide-simeth  (MAALOX/MYLANTA) 200-200-20 MG/5ML suspension 30 mL  30 mL Oral Q4H PRN Dasie Ellouise CROME, FNP       [START ON 01/20/2024] ARIPiprazole (ABILIFY) tablet 10 mg  10  mg Oral Daily Brionna Romanek L, PA-C       buPROPion  (WELLBUTRIN  XL) 24 hr tablet 300 mg  300 mg Oral Daily Alyssah Algeo L, PA-C   300 mg at 01/19/24 0815   haloperidol  (HALDOL ) tablet 5 mg  5 mg Oral TID PRN Dasie Ellouise CROME, FNP       And   diphenhydrAMINE  (BENADRYL ) capsule 50 mg  50 mg Oral TID PRN Dasie Ellouise CROME, FNP       haloperidol  lactate (HALDOL ) injection 5 mg  5 mg Intramuscular TID PRN Dasie Ellouise CROME, FNP       And   diphenhydrAMINE  (BENADRYL ) injection 50 mg  50 mg Intramuscular TID PRN Dasie Ellouise CROME, FNP       And   LORazepam  (ATIVAN ) injection 2 mg  2 mg Intramuscular TID PRN Dasie Ellouise CROME, FNP       haloperidol  lactate (HALDOL ) injection 10 mg  10 mg Intramuscular TID PRN Dasie Ellouise CROME, FNP       And   diphenhydrAMINE  (BENADRYL ) injection 50 mg  50 mg Intramuscular TID PRN Dasie Ellouise CROME, FNP       And   LORazepam  (ATIVAN ) injection 2 mg  2 mg Intramuscular TID PRN Allen, Tina L, FNP       magnesium  hydroxide (MILK OF MAGNESIA) suspension 30 mL  30 mL Oral Daily PRN Dasie Ellouise CROME, FNP       naproxen  (NAPROSYN ) tablet 250 mg  250 mg Oral BID WC Janya Eveland L, PA-C   250 mg at 01/19/24 0815   traZODone  (DESYREL ) tablet 50 mg  50 mg Oral QHS PRN Dasie Ellouise CROME, FNP   50 mg at 01/18/24 2108    Lab Results:  No results found for this or any previous visit (from the past 48 hours).   Blood Alcohol level:  Lab Results  Component Value Date   [facility] <15 01/12/2024   ETH <10 06/20/2023    Metabolic Disorder Labs: Lab Results  Component Value Date   HGBA1C 4.7 (L) 06/20/2023   MPG 88.19 06/20/2023   MPG 93.93 03/04/2023   Lab Results  Component Value Date   PROLACTIN 12.3 01/12/2024   Lab Results  Component Value Date   CHOL 231 (H) 06/20/2023   TRIG 221 (H) 06/20/2023   HDL 46 06/20/2023   CHOLHDL 5.0  06/20/2023   VLDL 44 (H) 06/20/2023   LDLCALC 141 (H) 06/20/2023    Physical Findings: AIMS:  , ,  ,  ,    CIWA:    COWS:      Psychiatric Specialty Exam:  Presentation  General Appearance:  Disheveled  Eye Contact: Good  Speech: Clear and Coherent; Normal Rate  Speech Volume: Normal    Mood and Affect  Mood: Depressed  Affect: Congruent   Thought Process  Thought Processes: Coherent; Goal Directed; Linear  Orientation:Full (Time, Place and Person)  Thought Content:Logical  Hallucinations: Visual  Ideas of Reference:None  Suicidal Thoughts: Yes, without intent, without plan  Homicidal Thoughts: No  Sensorium  Memory: Immediate Good; Recent Good  Judgment: Fair  Insight: Fair   Art Therapist  Concentration: Good  Attention Span: Good  Recall: Good  Fund of Knowledge: Fair  Language: Fair   Psychomotor Activity  Psychomotor Activity: Normal Musculoskeletal: Strength & Muscle Tone: within normal limits Gait & Station: normal Assets  Assets: Manufacturing Systems Engineer; Desire for Improvement; Physical Health; Social Support; Resilience; Vocational/Educational; Financial Resources/Insurance    Physical Exam: Physical  Exam ROS Blood pressure 121/83, pulse 86, temperature 97.8 F (36.6 C), temperature source Oral, resp. rate 18, height 5' 10 (1.778 m), weight 69.9 kg, SpO2 100%. Body mass index is 22.1 kg/m.  Diagnosis: Principal Problem:   MDD (major depressive disorder), recurrent episode, severe (HCC)   PLAN: Safety and Monitoring:  -- Voluntary admission to inpatient psychiatric unit for safety, stabilization and treatment  -- Daily contact with patient to assess and evaluate symptoms and progress in treatment  -- Patient's case to be discussed in multi-disciplinary team meeting  -- Observation Level : q15 minute checks  -- Vital signs:  q12 hours  -- Precautions: suicide, elopement, and assault -- Encouraged  patient to participate in unit milieu and in scheduled group therapies   2. Psychiatric Treatment:  Scheduled Medications: Discontinued Lexapro  due to past ineffectiveness at 20 mg dose per patient report Wellbutrin  XL 300 mg once daily, patient reports this was previously effective and well-tolerated  Abilify 10 mg once daily   -- The risks/benefits/side-effects/alternatives to this medication were discussed in detail with the patient and time was given for questions. The patient consents to medication trial.   3. Medical Issues Being Addressed:  Naproxen  250 mg twice daily for joint pain, will continue to monitor    4. Discharge Planning:   -- Social work and case management to assist with discharge planning and identification of hospital follow-up needs prior to discharge  -- Estimated LOS: 5-7 days  Camelia LITTIE Lukes, PA-C 01/19/2024, 5:28 PM

## 2024-01-19 NOTE — Plan of Care (Signed)

## 2024-01-20 NOTE — Plan of Care (Signed)
  Problem: Education: Goal: Emotional status will improve Outcome: Progressing Goal: Verbalization of understanding the information provided will improve Outcome: Progressing   Problem: Activity: Goal: Interest or engagement in activities will improve Outcome: Progressing Goal: Sleeping patterns will improve Outcome: Progressing   Problem: Coping: Goal: Ability to verbalize frustrations and anger appropriately will improve Outcome: Progressing Goal: Ability to demonstrate self-control will improve Outcome: Progressing   Problem: Health Behavior/Discharge Planning: Goal: Compliance with treatment plan for underlying cause of condition will improve Outcome: Progressing   Problem: Education: Goal: Mental status will improve Outcome: Not Progressing

## 2024-01-20 NOTE — Group Note (Signed)
 Recreation Therapy Group Note   Group Topic:General Recreation  Group Date: 01/20/2024 Start Time: 1030 End Time: 1120 Facilitators: Celestia Jeoffrey BRAVO, LRT, CTRS Location: Courtyard  Group Description: [employer]. LRT and patients played games of basketball, drew with chalk, and played corn hole while outside in the courtyard while getting fresh air and sunlight. Music was being played in the background. LRT and peers conversed about different games they have played before, what they do in their free time and anything else that is on their minds. LRT encouraged pts to drink water after being outside, sweating and getting their heart rate up.  Goal Area(s) Addressed: Patient will build on frustration tolerance skills. Patients will partake in a competitive play game with peers. Patients will gain knowledge of new leisure interest/hobby.    Affect/Mood: Appropriate   Participation Level: Active   Participation Quality: Independent   Behavior: Appropriate   Speech/Thought Process: Coherent   Insight: Good   Judgement: Good   Modes of Intervention: Activity   Patient Response to Interventions:  Receptive   Education Outcome:  Acknowledges education   Clinical Observations/Individualized Feedback: [name] was active in their participation of session activities and group discussion. Pt interacted well with LRT and peers duration of session.    Plan: Continue to engage patient in RT group sessions 2-3x/week.   Jeoffrey BRAVO Celestia, LRT, CTRS 01/20/2024 11:42 AM

## 2024-01-20 NOTE — Group Note (Signed)
 Date:  01/20/2024 Time:  8:56 PM  Group Topic/Focus:  Coping With Mental Health Crisis:   The purpose of this group is to help patients identify strategies for coping with mental health crisis.  Group discusses possible causes of crisis and ways to manage them effectively.    Participation Level:  Active  Participation Quality:  Appropriate  Affect:  Appropriate  Cognitive:  Appropriate  Insight: Appropriate  Engagement in Group:  Engaged  Modes of Intervention:  Discussion  Additional Comments:    Leigh VEAR Pais 01/20/2024, 8:56 PM

## 2024-01-20 NOTE — Progress Notes (Incomplete)
 [facility] MD Progress Note  01/20/2024 1:10 PM Christopher Forbes  MRN:  [id number]   Subjective:  Chart reviewed, case discussed in multidisciplinary meeting, patient seen during rounds.   11/3:  On interview today, patient is noted to be calm and cooperative, alert and oriented.  He denies SI/plan today.  He denies HI/plan and denies hallucinations.  Patient reports chronic suicidal ideation for several years.  He cites friends and family as protective factors.  He is tolerating current medication regimen well without adverse effects.  He is taking Abilify 10 mg and Wellbutrin  XL 300 mg.  Reports joint pain is controlled with naproxen .  He does not voice any concerns or complaints today.  11/2: On interview today, patient is noted to be pleasant, calm and cooperative.  He continues to report suicidal ideation without intent or plan though continues to state this is improving.  He is able to contract for safety.  He denies HI/plan.  He continues to report visual hallucinations but states these are also improving.  He denies auditory hallucinations.  He rates depression as 5 out of 10 and denies current anxiety.  He is tolerating current medication regimen well without adverse effects.  He is agreeable to Abilify dose increase.  He reports joint pain related to arthritis and states this continues to be well-controlled with naproxen .  11/1: On interview today, patient is noted to be calm and cooperative.  He reports persistent suicidal ideation without intent or plan, though states this is improving overall.  He reports improving visual hallucinations.  He denies auditory hallucinations.  He denies HI/plan.  He rates depression as 5 out of 10 and anxiety is 5 out of 10.  Sleep and appetite remain stable.  Patient reports joint pain is well-controlled with naproxen .  10/31: On interview today, patient is noted to be calm and cooperative, alert and oriented, and more talkative today.  He is able to discuss coping  skills, crisis resources, and support system in his friend and brother.  Upon hospital discharge, patient intends to move to [place]  with a friend.  Patient is looking forward to this transition.  He reports stable sleep and appetite.  He rates depression as 6 out of 10 today.  He denies current anxiety.  He endorses suicidal ideation without intent or plan today though notes this has been improving overall.  He is able to contract for safety.  He denies HI/plan.  He reports improving visual hallucinations, stating he saw 1 shadow figure today and 1 black blur this morning.  He denies auditory hallucinations.  He reports tolerating current medication regimen well without adverse effects.  He reports hip pain is controlled with Tylenol  and naproxen , will continue to monitor.  10/30: On interview today, patient is noted to be calm and cooperative.  He reports taking hydroxyzine  last night but feels that it increased feelings of panic and anxiety.  Will discontinue hydroxyzine  for this reason.  He reports otherwise tolerating medication regimen well.  Patient rates depression as 5 out of 10 and anxiety is 4 out of 10 today.    He endorses suicidal ideation without intent or plan this morning but denies suicidal ideation at time of interview.  He states he is trying to keep his mind busy. He is agreeable to Wellbutrin  XL dose increase, states he was previously taking 300 mg with good effect and tolerability.  He denies HI/plan.  He denies auditory hallucinations.  He reports improved visual hallucinations, reports 1 episode of seeing  a figure moving by the window today.  He reports hip pain has improved with naproxen .  He is no longer noted to be walking with a limp.  He denies redness or swelling of joints.  10/29: On interview today, patient is noted to be calm and cooperative, alert and oriented.  He is tolerating current medication regimen well without adverse effects.  He rates depression as 6 out of 10 and  anxiety as 6 out of 10 today.  He endorses suicidal ideation without intent or plan.  He is able to contract for safety.  He has not been observed engaging in self-harm behaviors.  He denies HI/plan.  He endorses visual hallucinations but states these have somewhat improved.  He denies current auditory hallucinations.  He is agreeable to Abilify dose increased at this time.  Shortly after dinner today patient reported left hip pain to nurse, who then informed provider.  Provider returned to interview patient regarding pain.  Patient reports left hip pain that started around dinnertime this evening.  He is noted to be walking with a limp.  He denies fall or acute trauma.  He denies previous injury to this site.  He denies pain in any other sites.  He reports history of arthritis but states this does not feel like a typical arthritis flare.  Vital signs are within normal limits. Per nursing report, patient requested narcotic pain medication and declined Tylenol  or ibuprofen . Will add naproxen  250 mg twice daily and continue to monitor. Patient advised to alert staff is pain is worsening, or if other symptoms such as swelling, increased warmth, or redness are noted.   10/28: On interview today, patient is noted to be interacting appropriately with peers in the milieu.  He returns to his room to engage in interview with provider.  He is noted to be calm and cooperative, alert and oriented.  When asked about suicidal ideation patient replies a little, and when asked about intent or plan, patient replies not yet.  Patient denies HI/plan.  He endorses visual hallucinations involving a black blur and shadow figures.  He endorses previous auditory hallucinations approximately 3 days ago but not currently.  Auditory hallucinations involve a deep, rough-sounding male voice calling patient's name.  He states he only ever hears name called and does not recognize the voice.  He denies command hallucinations.  He  states he hears auditory hallucinations approximately twice every couple of days.  He rates depression at 7 out of 10 and anxiety at 6 out of 10 today.  He is tolerating current medication regimen well without adverse effects.  He states he prefers to discontinue Lexapro  due to previous ineffectiveness at 20 mg dose.  He requests to restart Wellbutrin , which he states was effective in the past and well-tolerated.  He denies history of seizures, anorexia nervosa, or bulimia.   Sleep: Fair  Appetite:  Fair  Past Psychiatric History: see h&P Family History:  Family History  Problem Relation Age of Onset   Migraines Mother    Diabetes Father    Hyperlipidemia Father    Hypertension Father    COPD Father    Asthma Father    Migraines Brother    Stroke Maternal Grandmother    Colon cancer Neg Hx    Esophageal cancer Neg Hx    Rectal cancer Neg Hx    Stomach cancer Neg Hx    Social History:  Social History   Substance and Sexual Activity  Alcohol Use No  Alcohol/week: 0.0 standard drinks of alcohol     Social History   Substance and Sexual Activity  Drug Use No    Social History   Socioeconomic History   Marital status: Single    Spouse name: Not on file   Number of children: Not on file   Years of education: Not on file   Highest education level: 11th grade  Occupational History   Occupation: student  Tobacco Use   Smoking status: Some Days    Current packs/day: 0.25    Types: Cigarettes   Smokeless tobacco: Never   Tobacco comments:    1 -2 cigs a day for the past two months   Vaping Use   Vaping status: Former  Substance and Sexual Activity   Alcohol use: No    Alcohol/week: 0.0 standard drinks of alcohol   Drug use: No   Sexual activity: Yes    Partners: Male  Other Topics Concern   Not on file  Social History Narrative   Patient lives with brother step sister and step dad Transport Planner. Mom is involved. Right handed      06/21/2023 Pt states I live on the  street, I don't live with my step dad   Social Drivers of Health   Financial Resource Strain: High Risk (02/25/2023)   Overall Financial Resource Strain (CARDIA)    Difficulty of Paying Living Expenses: Very hard  Food Insecurity: No Food Insecurity (01/12/2024)   Hunger Vital Sign    Worried About Running Out of Food in the Last Year: Never true    Ran Out of Food in the Last Year: Never true  Recent Concern: Food Insecurity - Food Insecurity Present (01/12/2024)   Hunger Vital Sign    Worried About Running Out of Food in the Last Year: Sometimes true    Ran Out of Food in the Last Year: Sometimes true  Transportation Needs: No Transportation Needs (01/12/2024)   PRAPARE - Administrator, Civil Service (Medical): No    Lack of Transportation (Non-Medical): No  Physical Activity: Insufficiently Active (02/25/2023)   Exercise Vital Sign    Days of Exercise per Week: 3 days    Minutes of Exercise per Session: 30 min  Stress: Stress Concern Present (02/25/2023)   [employer] of Occupational Health - Occupational Stress Questionnaire    Feeling of Stress : To some extent  Social Connections: Socially Isolated (02/25/2023)   Social Connection and Isolation Panel    Frequency of Communication with Friends and Family: Twice a week    Frequency of Social Gatherings with Friends and Family: Once a week    Attends Religious Services: Never    Database Administrator or Organizations: No    Attends Engineer, Structural: Not on file    Marital Status: Separated   Past Medical History:  Past Medical History:  Diagnosis Date   Acne    Allergy    Anxiety    Asthma    Chronic headaches    Depression    GERD (gastroesophageal reflux disease)     Past Surgical History:  Procedure Laterality Date   OPEN REDUCTION INTERNAL FIXATION (ORIF) METACARPAL Right 11/19/2018   Procedure: Right small finger metacarpal open malunion repair;  Surgeon: Shari Easter, MD;   Location: [facility];  Service: Orthopedics;  Laterality: Right;  90 minutes   TYMPANOSTOMY TUBE PLACEMENT Bilateral    placed age 23 have fallen out   UPPER GASTROINTESTINAL ENDOSCOPY  WISDOM TOOTH EXTRACTION  04/04/2021    Current Medications: Current Facility-Administered Medications  Medication Dose Route Frequency Provider Last Rate Last Admin   acetaminophen  (TYLENOL ) tablet 650 mg  650 mg Oral Q6H PRN Dasie Ellouise CROME, FNP   650 mg at 01/17/24 1559   alum & mag hydroxide-simeth (MAALOX/MYLANTA) 200-200-20 MG/5ML suspension 30 mL  30 mL Oral Q4H PRN Dasie Ellouise CROME, FNP       ARIPiprazole (ABILIFY) tablet 10 mg  10 mg Oral Daily Lynnda Wiersma L, PA-C   10 mg at 01/20/24 9175   buPROPion  (WELLBUTRIN  XL) 24 hr tablet 300 mg  300 mg Oral Daily Skilar Marcou L, PA-C   300 mg at 01/20/24 9175   haloperidol  (HALDOL ) tablet 5 mg  5 mg Oral TID PRN Dasie Ellouise CROME, FNP       And   diphenhydrAMINE  (BENADRYL ) capsule 50 mg  50 mg Oral TID PRN Allen, Tina L, FNP       haloperidol  lactate (HALDOL ) injection 5 mg  5 mg Intramuscular TID PRN Dasie Ellouise CROME, FNP       And   diphenhydrAMINE  (BENADRYL ) injection 50 mg  50 mg Intramuscular TID PRN Dasie Ellouise CROME, FNP       And   LORazepam  (ATIVAN ) injection 2 mg  2 mg Intramuscular TID PRN Dasie Ellouise CROME, FNP       haloperidol  lactate (HALDOL ) injection 10 mg  10 mg Intramuscular TID PRN Dasie Ellouise CROME, FNP       And   diphenhydrAMINE  (BENADRYL ) injection 50 mg  50 mg Intramuscular TID PRN Dasie Ellouise CROME, FNP       And   LORazepam  (ATIVAN ) injection 2 mg  2 mg Intramuscular TID PRN Allen, Tina L, FNP       magnesium  hydroxide (MILK OF MAGNESIA) suspension 30 mL  30 mL Oral Daily PRN Dasie Ellouise CROME, FNP       naproxen  (NAPROSYN ) tablet 250 mg  250 mg Oral BID WC Loveda Colaizzi L, PA-C   250 mg at 01/20/24 0825   traZODone  (DESYREL ) tablet 50 mg  50 mg Oral QHS PRN Allen, Tina L, FNP   50 mg at 01/19/24 2104    Lab Results:  No results  found for this or any previous visit (from the past 48 hours).   Blood Alcohol level:  Lab Results  Component Value Date   [facility] <15 01/12/2024   ETH <10 06/20/2023    Metabolic Disorder Labs: Lab Results  Component Value Date   HGBA1C 4.7 (L) 06/20/2023   MPG 88.19 06/20/2023   MPG 93.93 03/04/2023   Lab Results  Component Value Date   PROLACTIN 12.3 01/12/2024   Lab Results  Component Value Date   CHOL 231 (H) 06/20/2023   TRIG 221 (H) 06/20/2023   HDL 46 06/20/2023   CHOLHDL 5.0 06/20/2023   VLDL 44 (H) 06/20/2023   LDLCALC 141 (H) 06/20/2023    Physical Findings: AIMS:  , ,  ,  ,    CIWA:    COWS:      Psychiatric Specialty Exam:  Presentation  General Appearance:  Disheveled  Eye Contact: Good  Speech: Clear and Coherent; Normal Rate  Speech Volume: Normal    Mood and Affect  Mood: Depressed  Affect: Congruent   Thought Process  Thought Processes: Coherent; Goal Directed; Linear  Orientation:Full (Time, Place and Person)  Thought Content:Logical  Hallucinations: Visual  Ideas of Reference:None  Suicidal Thoughts: No  Homicidal Thoughts: No  Sensorium  Memory: Immediate Good; Recent Good  Judgment: Fair  Insight: Fair   Art Therapist  Concentration: Good  Attention Span: Good  Recall: Good  Fund of Knowledge: Fair  Language: Fair   Psychomotor Activity  Psychomotor Activity: Normal Musculoskeletal: Strength & Muscle Tone: within normal limits Gait & Station: normal Assets  Assets: Manufacturing Systems Engineer; Desire for Improvement; Physical Health; Social Support; Resilience; Vocational/Educational; Financial Resources/Insurance    Physical Exam: Physical Exam ROS Blood pressure 112/75, pulse 88, temperature 97.8 F (36.6 C), temperature source Oral, resp. rate 18, height 5' 10 (1.778 m), weight 69.9 kg, SpO2 100%. Body mass index is 22.1 kg/m.  Diagnosis: Principal Problem:   MDD (major  depressive disorder), recurrent episode, severe (HCC)   PLAN: Safety and Monitoring:  -- Voluntary admission to inpatient psychiatric unit for safety, stabilization and treatment  -- Daily contact with patient to assess and evaluate symptoms and progress in treatment  -- Patient's case to be discussed in multi-disciplinary team meeting  -- Observation Level : q15 minute checks  -- Vital signs:  q12 hours  -- Precautions: suicide, elopement, and assault -- Encouraged patient to participate in unit milieu and in scheduled group therapies   2. Psychiatric Treatment:  Scheduled Medications: Discontinued Lexapro  due to past ineffectiveness at 20 mg dose per patient report Wellbutrin  XL 300 mg once daily, patient reports this was previously effective and well-tolerated  Abilify 10 mg once daily   -- The risks/benefits/side-effects/alternatives to this medication were discussed in detail with the patient and time was given for questions. The patient consents to medication trial.   3. Medical Issues Being Addressed:  Naproxen  250 mg twice daily for joint pain, will continue to monitor    4. Discharge Planning:   -- Social work and case management to assist with discharge planning and identification of hospital follow-up needs prior to discharge  -- Estimated LOS: 5-7 days  [employer], PA-C 01/20/2024, 1:10 PM

## 2024-01-20 NOTE — Progress Notes (Signed)
   01/20/24 0800  Psych Admission Type (Psych Patients Only)  Admission Status Voluntary  Psychosocial Assessment  Patient Complaints None  Eye Contact Fair  Facial Expression Animated  Affect Depressed  Speech Logical/coherent  Interaction Assertive  Motor Activity Slow  Appearance/Hygiene Poor hygiene  Behavior Characteristics Cooperative;Appropriate to situation  Mood Sad;Depressed  Aggressive Behavior  Effect No apparent injury  Thought Process  Coherency WDL  Content WDL  Delusions None reported or observed  Perception WDL  Hallucination None reported or observed  Judgment Impaired  Confusion None  Danger to Self  Current suicidal ideation? Passive  Agreement Not to Harm Self Yes  Description of Agreement verbal  Danger to Others  Danger to Others None reported or observed

## 2024-01-20 NOTE — Group Note (Signed)
 Recreation Therapy Group Note   Group Topic:Other  Group Date: 01/20/2024 Start Time: 1500 End Time: 1545 Facilitators: Celestia Jeoffrey FORBES ARTICE, CTRS Location: Craft Room  Activity Description/Intervention: Therapeutic Drumming. Patients with peers and staff were given the opportunity to engage in a leader facilitated HealthRHYTHMS Group Empowerment Drumming Circle with staff from the [employer], in partnership with [employer]. Teaching laboratory technician and trained [employer], Norleen Mon leading with LRT observing and documenting intervention and pt response. This evidenced-based practice targets 7 areas of health and wellbeing in the human experience including: stress-reduction, exercise, self-expression, camaraderie/support, nurturing, spirituality, and music-making (leisure).    Goal Area(s) Addresses:  Patient will engage in pro-social way in music group.  Patient will follow directions of drum leader on the first prompt. Patient will demonstrate no behavioral issues during group.  Patient will identify if a reduction in stress level occurs as a result of participation in therapeutic drum circle.    Affect/Mood: N/A   Participation Level: Did not attend    Clinical Observations/Individualized Feedback: Patient did not attend group.   Plan: Continue to engage patient in RT group sessions 2-3x/week.   Jeoffrey FORBES Celestia, LRT, CTRS 01/20/2024 5:12 PM

## 2024-01-20 NOTE — Group Note (Signed)
 LCSW Group Therapy Note  Group Date: 01/20/2024 Start Time: 1300 End Time: 1400   Type of Therapy and Topic:  Group Therapy - Healthy vs Unhealthy Coping Skills  Participation Level:  Active   Description of Group The focus of this group was to determine what unhealthy coping techniques typically are used by group members and what healthy coping techniques would be helpful in coping with various problems. Patients were guided in becoming aware of the differences between healthy and unhealthy coping techniques. Patients were asked to identify 2-3 healthy coping skills they would like to learn to use more effectively.  Therapeutic Goals Patients learned that coping is what human beings do all day long to deal with various situations in their lives Patients defined and discussed healthy vs unhealthy coping techniques Patients identified their preferred coping techniques and identified whether these were healthy or unhealthy Patients determined 2-3 healthy coping skills they would like to become more familiar with and use more often. Patients provided support and ideas to each other   Summary of Patient Progress:   During group, patient displayed appropriate behaviors.  Patient proved open to input from peers and feedback from [name]. Patient demonstrated fair insight into the subject matter, was respectful of peers, and participated throughout the entire session.   Therapeutic Modalities Cognitive Behavioral Therapy Motivational Interviewing  Sherryle JINNY Margo, [place] 01/20/2024  3:07 PM

## 2024-01-21 ENCOUNTER — Encounter: Payer: Self-pay | Admitting: Family

## 2024-01-21 ENCOUNTER — Other Ambulatory Visit: Payer: Self-pay

## 2024-01-21 MED ORDER — ARIPIPRAZOLE 10 MG PO TABS
10.0000 mg | ORAL_TABLET | Freq: Every day | ORAL | 0 refills | Status: AC
  Filled 2024-01-21: qty 30, 30d supply, fill #0

## 2024-01-21 MED ORDER — BUPROPION HCL ER (XL) 300 MG PO TB24
300.0000 mg | ORAL_TABLET | Freq: Every day | ORAL | 0 refills | Status: AC
  Filled 2024-01-21: qty 30, 30d supply, fill #0

## 2024-01-21 MED ORDER — NAPROXEN 250 MG PO TABS
250.0000 mg | ORAL_TABLET | Freq: Two times a day (BID) | ORAL | 0 refills | Status: AC
  Filled 2024-01-21: qty 28, 14d supply, fill #0

## 2024-01-21 MED ORDER — ALBUTEROL SULFATE HFA 108 (90 BASE) MCG/ACT IN AERS
2.0000 | INHALATION_SPRAY | RESPIRATORY_TRACT | Status: DC | PRN
  Administered 2024-01-21: 2 via RESPIRATORY_TRACT
  Filled 2024-01-21: qty 6.7

## 2024-01-21 MED ORDER — ALBUTEROL SULFATE HFA 108 (90 BASE) MCG/ACT IN AERS: 2.0000 | INHALATION_SPRAY | RESPIRATORY_TRACT | Status: AC | PRN

## 2024-01-21 NOTE — Progress Notes (Signed)
 This RN auscultated breath sounds anteriorly and posteriorly.  Breath sounds readily audible and clear in all 4 quadrants.  No wheezing and not dimished.  Pt motioned diagnally from sternum up to right shoulder.  ?pulled muscle   RN obtained Albuterol  inhaler from pharmacy ready for pickup.  This RN will observe pt self-administering when available on unit.

## 2024-01-21 NOTE — Plan of Care (Signed)

## 2024-01-21 NOTE — Progress Notes (Signed)
   01/20/24 2000  Psychosocial Assessment  Patient Complaints None  Eye Contact Fair  Facial Expression Animated  Affect Depressed  Speech Logical/coherent  Interaction Assertive  Motor Activity Slow  Appearance/Hygiene Poor hygiene  Behavior Characteristics Cooperative  Mood Sad;Depressed  Aggressive Behavior  Effect No apparent injury  Thought Process  Coherency WDL  Content WDL  Delusions None reported or observed  Perception WDL  Hallucination None reported or observed  Judgment Impaired  Confusion None  Danger to Self  Current suicidal ideation? Passive  Agreement Not to Harm Self Yes  Description of Agreement verbal  Danger to Others  Danger to Others None reported or observed

## 2024-01-21 NOTE — Progress Notes (Signed)
   01/21/24 0807  Psych Admission Type (Psych Patients Only)  Admission Status Voluntary  Psychosocial Assessment  Patient Complaints Anxiety;Depression (anx 4/10 and dep 5/10)  Eye Contact Fair  Facial Expression Animated  Affect Depressed  Speech Logical/coherent  Interaction Assertive  Motor Activity Slow  Appearance/Hygiene Disheveled;Poor hygiene  Behavior Characteristics Cooperative;Appropriate to situation  Mood Depressed;Sad;Pleasant  Aggressive Behavior  Effect No apparent injury  Thought Process  Coherency WDL  Content WDL  Delusions None reported or observed  Perception WDL;Hallucinations (denies seeing shawdows this am)  Hallucination None reported or observed  Judgment Impaired  Confusion None  Danger to Self  Current suicidal ideation? Denies  Description of Suicide Plan no plan  Self-Injurious Behavior No self-injurious ideation or behavior indicators observed or expressed   Agreement Not to Harm Self Yes  Description of Agreement Verbal  Danger to Others  Danger to Others None reported or observed

## 2024-01-21 NOTE — Progress Notes (Addendum)
 Pt reported taking his sleep meds makes it harder to breathe and going outside in the courtyard makes his chest feel funny.  Denies SI/HI/AVH this am.  Christopher Forbes scheduled naprosyn  for right hand pain and left hip pain.  He had ORIF in 2020 on rt small finger.  No new complaints other than issues with respiratory listed above.  [employer] PA made aware.

## 2024-01-21 NOTE — Group Note (Signed)
 Recreation Therapy Group Note   Group Topic:Coping Skills  Group Date: 01/21/2024 Start Time: 1520 End Time: 1605 Facilitators: Celestia Jeoffrey BRAVO, LRT, CTRS Location: Craft Room  Group Description: Mind Map.  Patient was provided a blank template of a diagram with 32 blank boxes in a tiered system, branching from the center (similar to a bubble chart). LRT directed patients to label the middle of the diagram Coping Skills. LRT and patients then came up with 8 different coping skills as examples. Pt were directed to record their coping skills in the 2nd tier boxes closest to the center.  Patients would then share their coping skills with the group as LRT wrote them out. LRT gave a handout of 99 different coping skills at the end of group.    Goal Area(s) Addressed: Patients will be able to define "coping skills". Patient will identify new coping skills.  Patient will increase communication.    Affect/Mood: Appropriate   Participation Level: Active and Engaged   Participation Quality: Independent   Behavior: Appropriate, Calm, and Cooperative   Speech/Thought Process: Coherent   Insight: Good   Judgement: Good   Modes of Intervention: Exploration, Worksheet, and Writing   Patient Response to Interventions:  Attentive, Engaged, Interested , and Receptive   Education Outcome:  Acknowledges education   Clinical Observations/Individualized Feedback: Christopher Forbes was active in their participation of session activities and group discussion. 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    Plan: Continue to engage patient in RT group sessions 2-3x/week.   Jeoffrey BRAVO Celestia, LRT, CTRS 01/21/2024 5:03 PM

## 2024-01-21 NOTE — Group Note (Signed)
 Recreation Therapy Group Note   Group Topic:Health and Wellness  Group Date: 01/21/2024 Start Time: 1000 End Time: 1055 Facilitators: Celestia Jeoffrey BRAVO, LRT, CTRS Location: Courtyard  Group Description: [employer]. LRT and patients played games of basketball, drew with chalk, and played corn hole while outside in the courtyard while getting fresh air and sunlight. Music was being played in the background. LRT and peers conversed about different games they have played before, what they do in their free time and anything else that is on their minds. LRT encouraged pts to drink water after being outside, sweating and getting their heart rate up.  Goal Area(s) Addressed: Patient will build on frustration tolerance skills. Patients will partake in a competitive play game with peers. Patients will gain knowledge of new leisure interest/hobby.    Affect/Mood: Appropriate   Participation Level: Engaged   Participation Quality: Independent   Behavior: Calm and Cooperative   Speech/Thought Process: Coherent   Insight: Good   Judgement: Good   Modes of Intervention: Exploration and Music   Patient Response to Interventions:  Attentive, Interested , and Receptive   Education Outcome:  Acknowledges education   Clinical Observations/Individualized Feedback: Janelle was active in their participation of session activities and group discussion. Pt shared with LRT after group that he was having trouble breathing due to his asthma. LRT notified Crystal, PA.   Plan: Continue to engage patient in RT group sessions 2-3x/week.   Jeoffrey BRAVO Celestia, LRT, CTRS 01/21/2024 11:48 AM

## 2024-01-21 NOTE — Group Note (Signed)
 [facility] LCSW Group Therapy Note   Group Date: 01/21/2024 Start Time: 1300 End Time: 1400  Type of Therapy/Topic:  Group Therapy:  Feelings about Diagnosis  Participation Level:  None    Description of Group:    This group will allow patients to explore their thoughts and feelings about diagnoses they have received. Patients will be guided to explore their level of understanding and acceptance of these diagnoses. Facilitator will encourage patients to process their thoughts and feelings about the reactions of others to their diagnosis, and will guide patients in identifying ways to discuss their diagnosis with significant others in their lives. This group will be process-oriented, with patients participating in exploration of their own experiences as well as giving and receiving support and challenge from other group members.   Therapeutic Goals: 1. Patient will demonstrate understanding of diagnosis as evidence by identifying two or more symptoms of the disorder:  2. Patient will be able to express two feelings regarding the diagnosis 3. Patient will demonstrate ability to communicate their needs through discussion and/or role plays  Summary of Patient Progress: Patient was present for the entirety of the group process. He participated in the icebreaker but not in the larger group discussion. However, pt was appropriately behaved and appeared to attend to the conversation. Insight into the topic remains questionable.    Therapeutic Modalities:   Cognitive Behavioral Therapy Brief Therapy Feelings Identification    Nadara JONELLE Fam, LCSW

## 2024-01-21 NOTE — Progress Notes (Signed)
   01/21/24 2042  Psych Admission Type (Psych Patients Only)  Admission Status Voluntary  Psychosocial Assessment  Patient Complaints Depression;Irritability (Patient reports irritability and increased depression today. He reports not being able to identify a trigger.)  Eye Contact Fair  Facial Expression Animated  Affect Depressed  Speech Logical/coherent;Soft  Interaction Assertive  Motor Activity Unsteady (Patient currently with an unsteady gait/limp due to right hip pain)  Appearance/Hygiene Disheveled  Behavior Characteristics Cooperative  Mood Depressed;Pleasant  Thought Process  Coherency WDL  Content WDL  Delusions None reported or observed  Perception WDL  Hallucination None reported or observed  Judgment Poor (improving)  Confusion None  Danger to Self  Current suicidal ideation? Denies  Self-Injurious Behavior No self-injurious ideation or behavior indicators observed or expressed   Danger to Others  Danger to Others None reported or observed

## 2024-01-21 NOTE — Progress Notes (Signed)
 See new orders:  Albuterol  inhaler ordered and Trazodone  D/C'd

## 2024-01-21 NOTE — Group Note (Signed)
 Date:  01/21/2024 Time:  10:20 PM  Group Topic/Focus:  Self Care:   The focus of this group is to help patients understand the importance of self-care in order to improve or restore emotional, physical, spiritual, interpersonal, and financial health. Wrap-Up Group:   The focus of this group is to help patients review their daily goal of treatment and discuss progress on daily workbooks.    Participation Level:  Active  Participation Quality:  Appropriate and Attentive  Affect:  Appropriate  Cognitive:  Alert, Appropriate, and Oriented  Insight: Appropriate and Good  Engagement in Group:  Engaged  Modes of Intervention:  Discussion and Support  Additional Comments:  N/A  Christopher Forbes 01/21/2024, 10:20 PM

## 2024-01-21 NOTE — Group Note (Signed)
 Date:  01/21/2024 Time:  1:27 PM  Group Topic/Focus:  Goals Group:   The focus of this group is to help patients establish daily goals to achieve during treatment and discuss how the patient can incorporate goal setting into their daily lives to aide in recovery.    Participation Level:  Active  Participation Quality:  Appropriate  Affect:  Appropriate  Cognitive:  Alert  Insight: Appropriate  Engagement in Group:  Engaged  Modes of Intervention:  Activity, Discussion, and Education  Additional Comments:    Christopher Forbes 01/21/2024, 1:27 PM

## 2024-01-21 NOTE — Progress Notes (Signed)
 [facility] MD Progress Note  01/21/2024 10:06 AM Christopher Forbes  MRN:  [id number]   Subjective:  Chart reviewed, case discussed in multidisciplinary meeting, patient seen during rounds.   11/4: On interview today, patient is noted to be pleasant, calm and cooperative.  He denies SI/HI/plan and denies hallucinations.  He reports stable sleep and appetite.  He continues to respond well to medication regimen and denies adverse effects.  He reports improved depressive symptoms and anxiety.  He does report feeling angry today without identifiable cause.  He reports he has talked to his friend who he will be moving to [place]  with and is agreeable for discharge tomorrow, states friend able to pick him up.  Patient denies access to guns or other lethal means.  He is able to discuss support system, coping skills including listening to music, and crisis resources. Patient reported transient shortness of breath and chest tightness in the morning and when outside.  Patient reports history of asthma for which he uses albuterol  rescue inhaler.  Will order albuterol  inhaler for as needed use and discontinue trazodone  as patient felt trazodone  may be exacerbating symptoms.  Patient denies current shortness of breath or chest tightness.  Counseled patient to inform staff if symptoms recur or if any other concerns arise.  11/3:  On interview today, patient is noted to be calm and cooperative, alert and oriented.  He denies SI/plan today.  He denies HI/plan and denies hallucinations.  Patient reports chronic suicidal ideation for several years.  He cites friends and family as protective factors.  He is tolerating current medication regimen well without adverse effects.  He is taking Abilify 10 mg and Wellbutrin  XL 300 mg.  Reports joint pain is controlled with naproxen .  He does not voice any concerns or complaints today.  11/2: On interview today, patient is noted to be pleasant, calm and cooperative.  He continues to report  suicidal ideation without intent or plan though continues to state this is improving.  He is able to contract for safety.  He denies HI/plan.  He continues to report visual hallucinations but states these are also improving.  He denies auditory hallucinations.  He rates depression as 5 out of 10 and denies current anxiety.  He is tolerating current medication regimen well without adverse effects.  He is agreeable to Abilify dose increase.  He reports joint pain related to arthritis and states this continues to be well-controlled with naproxen .  11/1: On interview today, patient is noted to be calm and cooperative.  He reports persistent suicidal ideation without intent or plan, though states this is improving overall.  He reports improving visual hallucinations.  He denies auditory hallucinations.  He denies HI/plan.  He rates depression as 5 out of 10 and anxiety is 5 out of 10.  Sleep and appetite remain stable.  Patient reports joint pain is well-controlled with naproxen .  10/31: On interview today, patient is noted to be calm and cooperative, alert and oriented, and more talkative today.  He is able to discuss coping skills, crisis resources, and support system in his friend and brother.  Upon hospital discharge, patient intends to move to [place]  with a friend.  Patient is looking forward to this transition.  He reports stable sleep and appetite.  He rates depression as 6 out of 10 today.  He denies current anxiety.  He endorses suicidal ideation without intent or plan today though notes this has been improving overall.  He is able to contract for  safety.  He denies HI/plan.  He reports improving visual hallucinations, stating he saw 1 shadow figure today and 1 black blur this morning.  He denies auditory hallucinations.  He reports tolerating current medication regimen well without adverse effects.  He reports hip pain is controlled with Tylenol  and naproxen , will continue to monitor.  10/30: On  interview today, patient is noted to be calm and cooperative.  He reports taking hydroxyzine  last night but feels that it increased feelings of panic and anxiety.  Will discontinue hydroxyzine  for this reason.  He reports otherwise tolerating medication regimen well.  Patient rates depression as 5 out of 10 and anxiety is 4 out of 10 today.    He endorses suicidal ideation without intent or plan this morning but denies suicidal ideation at time of interview.  He states he is trying to keep his mind busy. He is agreeable to Wellbutrin  XL dose increase, states he was previously taking 300 mg with good effect and tolerability.  He denies HI/plan.  He denies auditory hallucinations.  He reports improved visual hallucinations, reports 1 episode of seeing a figure moving by the window today.  He reports hip pain has improved with naproxen .  He is no longer noted to be walking with a limp.  He denies redness or swelling of joints.  10/29: On interview today, patient is noted to be calm and cooperative, alert and oriented.  He is tolerating current medication regimen well without adverse effects.  He rates depression as 6 out of 10 and anxiety as 6 out of 10 today.  He endorses suicidal ideation without intent or plan.  He is able to contract for safety.  He has not been observed engaging in self-harm behaviors.  He denies HI/plan.  He endorses visual hallucinations but states these have somewhat improved.  He denies current auditory hallucinations.  He is agreeable to Abilify dose increased at this time.  Shortly after dinner today patient reported left hip pain to nurse, who then informed provider.  Provider returned to interview patient regarding pain.  Patient reports left hip pain that started around dinnertime this evening.  He is noted to be walking with a limp.  He denies fall or acute trauma.  He denies previous injury to this site.  He denies pain in any other sites.  He reports history of arthritis but states  this does not feel like a typical arthritis flare.  Vital signs are within normal limits. Per nursing report, patient requested narcotic pain medication and declined Tylenol  or ibuprofen . Will add naproxen  250 mg twice daily and continue to monitor. Patient advised to alert staff is pain is worsening, or if other symptoms such as swelling, increased warmth, or redness are noted.   10/28: On interview today, patient is noted to be interacting appropriately with peers in the milieu.  He returns to his room to engage in interview with provider.  He is noted to be calm and cooperative, alert and oriented.  When asked about suicidal ideation patient replies a little, and when asked about intent or plan, patient replies not yet.  Patient denies HI/plan.  He endorses visual hallucinations involving a black blur and shadow figures.  He endorses previous auditory hallucinations approximately 3 days ago but not currently.  Auditory hallucinations involve a deep, rough-sounding male voice calling patient's name.  He states he only ever hears name called and does not recognize the voice.  He denies command hallucinations.  He states he hears auditory hallucinations  approximately twice every couple of days.  He rates depression at 7 out of 10 and anxiety at 6 out of 10 today.  He is tolerating current medication regimen well without adverse effects.  He states he prefers to discontinue Lexapro  due to previous ineffectiveness at 20 mg dose.  He requests to restart Wellbutrin , which he states was effective in the past and well-tolerated.  He denies history of seizures, anorexia nervosa, or bulimia.   Sleep: Fair  Appetite:  Fair  Past Psychiatric History: see h&P Family History:  Family History  Problem Relation Age of Onset   Migraines Mother    Diabetes Father    Hyperlipidemia Father    Hypertension Father    COPD Father    Asthma Father    Migraines Brother    Stroke Maternal Grandmother    Colon  cancer Neg Hx    Esophageal cancer Neg Hx    Rectal cancer Neg Hx    Stomach cancer Neg Hx    Social History:  Social History   Substance and Sexual Activity  Alcohol Use No   Alcohol/week: 0.0 standard drinks of alcohol     Social History   Substance and Sexual Activity  Drug Use Yes   Types: Marijuana, Other-see comments   Comment: opiates    Social History   Socioeconomic History   Marital status: Single    Spouse name: Not on file   Number of children: Not on file   Years of education: Not on file   Highest education level: 11th grade  Occupational History   Occupation: consulting civil engineer  Tobacco Use   Smoking status: Some Days    Current packs/day: 0.25    Types: Cigarettes   Smokeless tobacco: Never   Tobacco comments:    1 -2 cigs a day for the past two months   Vaping Use   Vaping status: Former  Substance and Sexual Activity   Alcohol use: No    Alcohol/week: 0.0 standard drinks of alcohol   Drug use: Yes    Types: Marijuana, Other-see comments    Comment: opiates   Sexual activity: Yes    Partners: Male  Other Topics Concern   Not on file  Social History Narrative   Patient lives with brother step sister and step dad Transport Planner. Mom is involved. Right handed      06/21/2023 Pt states I live on the street, I don't live with my step dad   Social Drivers of Health   Financial Resource Strain: High Risk (02/25/2023)   Overall Financial Resource Strain (CARDIA)    Difficulty of Paying Living Expenses: Very hard  Food Insecurity: No Food Insecurity (01/12/2024)   Hunger Vital Sign    Worried About Running Out of Food in the Last Year: Never true    Ran Out of Food in the Last Year: Never true  Recent Concern: Food Insecurity - Food Insecurity Present (01/12/2024)   Hunger Vital Sign    Worried About Running Out of Food in the Last Year: Sometimes true    Ran Out of Food in the Last Year: Sometimes true  Transportation Needs: No Transportation Needs  (01/12/2024)   PRAPARE - Administrator, Civil Service (Medical): No    Lack of Transportation (Non-Medical): No  Physical Activity: Insufficiently Active (02/25/2023)   Exercise Vital Sign    Days of Exercise per Week: 3 days    Minutes of Exercise per Session: 30 min  Stress: Stress Concern Present (  02/25/2023)   [employer] - Occupational Stress Questionnaire    Feeling of Stress : To some extent  Social Connections: Socially Isolated (02/25/2023)   Social Connection and Isolation Panel    Frequency of Communication with Friends and Family: Twice a week    Frequency of Social Gatherings with Friends and Family: Once a week    Attends Religious Services: Never    Database Administrator or Organizations: No    Attends Engineer, Structural: Not on file    Marital Status: Separated   Past Medical History:  Past Medical History:  Diagnosis Date   Acne    Allergy    Anxiety    Asthma    Chronic headaches    Depression    GERD (gastroesophageal reflux disease)     Past Surgical History:  Procedure Laterality Date   OPEN REDUCTION INTERNAL FIXATION (ORIF) METACARPAL Right 11/19/2018   Procedure: Right small finger metacarpal open malunion repair;  Surgeon: Shari Easter, MD;  Location: [facility];  Service: Orthopedics;  Laterality: Right;  90 minutes   TYMPANOSTOMY TUBE PLACEMENT Bilateral    placed age 24 have fallen out   UPPER GASTROINTESTINAL ENDOSCOPY     WISDOM TOOTH EXTRACTION  04/04/2021    Current Medications: Current Facility-Administered Medications  Medication Dose Route Frequency Provider Last Rate Last Admin   acetaminophen  (TYLENOL ) tablet 650 mg  650 mg Oral Q6H PRN Dasie Ellouise CROME, FNP   650 mg at 01/17/24 1559   albuterol  (VENTOLIN  HFA) 108 (90 Base) MCG/ACT inhaler 2 puff  2 puff Inhalation Q4H PRN Adaria Hole L, PA-C       alum & mag hydroxide-simeth (MAALOX/MYLANTA) 200-200-20 MG/5ML  suspension 30 mL  30 mL Oral Q4H PRN Dasie Ellouise CROME, FNP       ARIPiprazole (ABILIFY) tablet 10 mg  10 mg Oral Daily Camden Mazzaferro L, PA-C   10 mg at 01/21/24 9192   buPROPion  (WELLBUTRIN  XL) 24 hr tablet 300 mg  300 mg Oral Daily Michaelene Dutan L, PA-C   300 mg at 01/21/24 9192   haloperidol  (HALDOL ) tablet 5 mg  5 mg Oral TID PRN Dasie Ellouise CROME, FNP       And   diphenhydrAMINE  (BENADRYL ) capsule 50 mg  50 mg Oral TID PRN Dasie Ellouise CROME, FNP       haloperidol  lactate (HALDOL ) injection 5 mg  5 mg Intramuscular TID PRN Dasie Ellouise CROME, FNP       And   diphenhydrAMINE  (BENADRYL ) injection 50 mg  50 mg Intramuscular TID PRN Dasie Ellouise CROME, FNP       And   LORazepam  (ATIVAN ) injection 2 mg  2 mg Intramuscular TID PRN Dasie Ellouise CROME, FNP       haloperidol  lactate (HALDOL ) injection 10 mg  10 mg Intramuscular TID PRN Dasie Ellouise CROME, FNP       And   diphenhydrAMINE  (BENADRYL ) injection 50 mg  50 mg Intramuscular TID PRN Dasie Ellouise CROME, FNP       And   LORazepam  (ATIVAN ) injection 2 mg  2 mg Intramuscular TID PRN Allen, Tina L, FNP       magnesium  hydroxide (MILK OF MAGNESIA) suspension 30 mL  30 mL Oral Daily PRN Dasie Ellouise CROME, FNP       naproxen  (NAPROSYN ) tablet 250 mg  250 mg Oral BID WC Ozetta Flatley L, PA-C   250 mg at 01/21/24 9192    Lab  Results:  No results found for this or any previous visit (from the past 48 hours).   Blood Alcohol level:  Lab Results  Component Value Date   [employer] <15 01/12/2024   ETH <10 06/20/2023    Metabolic Disorder Labs: Lab Results  Component Value Date   HGBA1C 4.7 (L) 06/20/2023   MPG 88.19 06/20/2023   MPG 93.93 03/04/2023   Lab Results  Component Value Date   PROLACTIN 12.3 01/12/2024   Lab Results  Component Value Date   CHOL 231 (H) 06/20/2023   TRIG 221 (H) 06/20/2023   HDL 46 06/20/2023   CHOLHDL 5.0 06/20/2023   VLDL 44 (H) 06/20/2023   LDLCALC 141 (H) 06/20/2023    Physical Findings: AIMS:  , ,  ,  ,    CIWA:    COWS:       Psychiatric Specialty Exam:  Presentation  General Appearance:  Disheveled  Eye Contact: Good  Speech: Clear and Coherent; Normal Rate  Speech Volume: Normal    Mood and Affect  Mood: Depressed  Affect: Congruent   Thought Process  Thought Processes: Coherent; Goal Directed; Linear  Orientation:Full (Time, Place and Person)  Thought Content:Logical  Hallucinations: Visual  Ideas of Reference:None  Suicidal Thoughts: No  Homicidal Thoughts: No  Sensorium  Memory: Immediate Good; Recent Good  Judgment: Fair  Insight: Fair   Art Therapist  Concentration: Good  Attention Span: Good  Recall: Good  Fund of Knowledge: Fair  Language: Fair   Psychomotor Activity  Psychomotor Activity: Normal Musculoskeletal: Strength & Muscle Tone: within normal limits Gait & Station: normal Assets  Assets: Manufacturing Systems Engineer; Desire for Improvement; Physical Health; Social Support; Resilience; Vocational/Educational; Financial Resources/Insurance    Physical Exam: Physical Exam ROS Blood pressure 103/66, pulse 86, temperature 97.8 F (36.6 C), temperature source Oral, resp. rate 17, height 5' 10 (1.778 m), weight 69.9 kg, SpO2 100%. Body mass index is 22.1 kg/m.  Diagnosis: Principal Problem:   MDD (major depressive disorder), recurrent episode, severe (HCC)   PLAN: Safety and Monitoring:  -- Voluntary admission to inpatient psychiatric unit for safety, stabilization and treatment  -- Daily contact with patient to assess and evaluate symptoms and progress in treatment  -- Patient's case to be discussed in multi-disciplinary team meeting  -- Observation Level : q15 minute checks  -- Vital signs:  q12 hours  -- Precautions: suicide, elopement, and assault -- Encouraged patient to participate in unit milieu and in scheduled group therapies   2. Psychiatric Treatment:  Scheduled Medications: Discontinued Lexapro  due to past  ineffectiveness at 20 mg dose per patient report Wellbutrin  XL 300 mg once daily, patient reports this was previously effective and well-tolerated  Abilify 10 mg once daily Discontinued trazodone  due to potential asthma exacerbation per patient report   -- The risks/benefits/side-effects/alternatives to this medication were discussed in detail with the patient and time was given for questions. The patient consents to medication trial.   3. Medical Issues Being Addressed:  Naproxen  250 mg twice daily for joint pain, will continue to monitor  Albuterol  inhaler as needed for asthma exacerbation   4. Discharge Planning:             -- Wednesday  -- Social work and case management to assist with discharge planning and identification of hospital follow-up needs prior to discharge  -- Estimated LOS: 7-10 days  [employer], PA-C 01/21/2024, 10:06 AM

## 2024-01-21 NOTE — Group Note (Signed)
 Date:  01/21/2024 Time:  3:39 PM  Group Topic/Focus:   Structured Activity  Participation Level:  Active  Participation Quality:  Appropriate  Affect:  Appropriate  Cognitive:  Appropriate  Insight: Appropriate  Engagement in Group:  Engaged  Modes of Intervention:  Activity  Additional Comments:    Maigan Bittinger A Aria Jarrard 01/21/2024, 3:39 PM

## 2024-01-22 ENCOUNTER — Other Ambulatory Visit: Payer: Self-pay

## 2024-01-22 DIAGNOSIS — F333 Major depressive disorder, recurrent, severe with psychotic symptoms: Secondary | ICD-10-CM

## 2024-01-22 NOTE — Plan of Care (Signed)

## 2024-01-22 NOTE — Group Note (Signed)
 LCSW Group Therapy Note   Group Date: 01/22/2024 Start Time: 1300 End Time: 1400   Type of Therapy and Topic:  Group Therapy: Challenging Core Beliefs  Participation Level:  Minimal  Description of Group:  Patients were educated about core beliefs and asked to identify one harmful core belief that they have. Patients were asked to explore from where those beliefs originate. Patients were asked to discuss how those beliefs make them feel and the resulting behaviors of those beliefs. They were then be asked if those beliefs are true and, if so, what evidence they have to support them. Lastly, group members were challenged to replace those negative core beliefs with helpful beliefs.   Therapeutic Goals:   1. Patient will identify harmful core beliefs and explore the origins of such beliefs. 2. Patient will identify feelings and behaviors that result from those core beliefs. 3. Patient will discuss whether such beliefs are true. 4.  Patient will replace harmful core beliefs with helpful ones.  Summary of Patient Progress:  Patient minimally engaged in processing and exploring how core beliefs are formed and how they impact thoughts, feelings, and behaviors. Patient proved open to input from peers and feedback from [name]. Patient demonstrated basic insight into the subject matter, was respectful and supportive of peers, and participated throughout the entire session.  Therapeutic Modalities: Cognitive Behavioral Therapy; Solution-Focused Therapy   Christopher Forbes M Clerance Umland, LCSWA 01/22/2024  2:03 PM

## 2024-01-22 NOTE — Group Note (Signed)
 Date:  01/22/2024 Time:  5:36 PM  Group Topic/Focus:  Goals Group:   The focus of this group is to help patients establish daily goals to achieve during treatment and discuss how the patient can incorporate goal setting into their daily lives to aide in recovery.    Participation Level:  Active  Participation Quality:  Appropriate  Affect:  Appropriate  Cognitive:  Alert  Insight: Appropriate  Engagement in Group:  Engaged  Modes of Intervention:  Activity, Discussion, and Education  Additional Comments:    Christopher Forbes Bennett 01/22/2024, 5:36 PM

## 2024-01-22 NOTE — Discharge Summary (Signed)
 Physician Discharge Summary Note  Patient:  Christopher Forbes is an 23 y.o., male MRN:  [id number] DOB:  02/16/2001 Patient phone:  [phone] (home)  Patient address:   Homeless [address] [place] [address],   Total time spent: 40 min Date of Admission:  01/12/2024 Date of Discharge: 01/22/24  Reason for Admission:  Patient presented voluntarily to [facility] behavioral health for walk-in assessment PTA.  Patient endorsed suicidal ideation with a plan to slit wrist or jump from a bridge. Patient shared that he had a knife but gave it to a friend for safekeeping.  Patient reported suicidal ideation worsening x 4 days.  Patient was unable to contract verbally for safety at time. Patient is admitted to adult psych unit with Q15 min safety monitoring. Multidisciplinary team approach is offered. Medication management; group/milieu therapy is offered.   Principal Problem: MDD (major depressive disorder), recurrent episode, severe (HCC) Discharge Diagnoses: Principal Problem:   MDD (major depressive disorder), recurrent episode, severe (HCC)   Past Psychiatric History: see h&p  Family Psychiatric  History: see h&p Social History:  Social History   Substance and Sexual Activity  Alcohol Use No   Alcohol/week: 0.0 standard drinks of alcohol     Social History   Substance and Sexual Activity  Drug Use Yes   Types: Marijuana, Other-see comments   Comment: opiates    Social History   Socioeconomic History   Marital status: Single    Spouse name: Not on file   Number of children: Not on file   Years of education: Not on file   Highest education level: 11th grade  Occupational History   Occupation: consulting civil engineer  Tobacco Use   Smoking status: Some Days    Current packs/day: 0.25    Types: Cigarettes   Smokeless tobacco: Never   Tobacco comments:    1 -2 cigs a day for the past two months   Vaping Use   Vaping status: Former  Substance and Sexual  Activity   Alcohol use: No    Alcohol/week: 0.0 standard drinks of alcohol   Drug use: Yes    Types: Marijuana, Other-see comments    Comment: opiates   Sexual activity: Yes    Partners: Male  Other Topics Concern   Not on file  Social History Narrative   Patient lives with brother step sister and step dad Transport Planner. Mom is involved. Right handed      06/21/2023 Pt states I live on the street, I don't live with my step dad   Social Drivers of Health   Financial Resource Strain: High Risk (02/25/2023)   Overall Financial Resource Strain (CARDIA)    Difficulty of Paying Living Expenses: Very hard  Food Insecurity: No Food Insecurity (01/12/2024)   Hunger Vital Sign    Worried About Running Out of Food in the Last Year: Never true    Ran Out of Food in the Last Year: Never true  Recent Concern: Food Insecurity - Food Insecurity Present (01/12/2024)   Hunger Vital Sign    Worried About Running Out of Food in the Last Year: Sometimes true    Ran Out of Food in the Last Year: Sometimes true  Transportation Needs: No Transportation Needs (01/12/2024)   PRAPARE - Administrator, Civil Service (Medical): No    Lack of Transportation (Non-Medical): No  Physical Activity: Insufficiently Active (02/25/2023)   Exercise Vital Sign    Days of Exercise per Week: 3 days  Minutes of Exercise per Session: 30 min  Stress: Stress Concern Present (02/25/2023)   [employer] of Occupational Health - Occupational Stress Questionnaire    Feeling of Stress : To some extent  Social Connections: Socially Isolated (02/25/2023)   Social Connection and Isolation Panel    Frequency of Communication with Friends and Family: Twice a week    Frequency of Social Gatherings with Friends and Family: Once a week    Attends Religious Services: Never    Database Administrator or Organizations: No    Attends Engineer, Structural: Not on file    Marital Status: Separated   Past Medical  History:  Past Medical History:  Diagnosis Date   Acne    Allergy    Anxiety    Asthma    Chronic headaches    Depression    GERD (gastroesophageal reflux disease)     Past Surgical History:  Procedure Laterality Date   OPEN REDUCTION INTERNAL FIXATION (ORIF) METACARPAL Right 11/19/2018   Procedure: Right small finger metacarpal open malunion repair;  Surgeon: Shari Easter, MD;  Location: [facility];  Service: Orthopedics;  Laterality: Right;  90 minutes   TYMPANOSTOMY TUBE PLACEMENT Bilateral    placed age 79 have fallen out   UPPER GASTROINTESTINAL ENDOSCOPY     WISDOM TOOTH EXTRACTION  04/04/2021   Family History:  Family History  Problem Relation Age of Onset   Migraines Mother    Diabetes Father    Hyperlipidemia Father    Hypertension Father    COPD Father    Asthma Father    Migraines Brother    Stroke Maternal Grandmother    Colon cancer Neg Hx    Esophageal cancer Neg Hx    Rectal cancer Neg Hx    Stomach cancer Neg Hx     Hospital Course:  Patient presented voluntarily to [facility] behavioral health for walk-in assessment PTA.  Patient endorsed suicidal ideation with a plan to slit wrist or jump from a bridge. Patient shared that he had a knife but gave it to a friend for safekeeping.  Patient reported suicidal ideation worsening x 4 days.  Patient was unable to contract verbally for safety at time. Patient is admitted to adult psych unit with Q15 min safety monitoring. Multidisciplinary team approach is offered. Medication management; group/milieu therapy is offered.  Detailed risk assessment is complete based on clinical exam and individual risk factors and acute suicide risk is low and acute violence risk is low.     On admission,following medication adjustments were done and patient tolerated the changes very well.:Discontinued Lexapro  due to past ineffectiveness at 20 mg dose per patient report Wellbutrin  XL 300 mg once daily, patient  reports this was previously effective and well-tolerated  Abilify 10 mg once daily  Patient maintains safe behaviors on the unit and participated in groups.  He initially wanted to go to [place]  with his friend but the plan got postponed for few days.  He reached out to his mom and some of his friends with no help received.  He finally agreed with the team's help of sending him to [facility] Discontinued trazodone  due to potential asthma exacerbation per patient report Currently, all modifiable risk of harm to self/harm to others have been addressed and patient is no longer appropriate for the acute inpatient setting and is able to continue treatment for mental health needs in the community with the supports as indicated below.  Patient is educated and  verbalized understanding of discharge plan of care including medications, follow-up appointments, mental health resources and further crisis services in the community.  He is instructed to call 911 or present to the nearest emergency room should he experience any decompensation in mood, disturbance of bowel or return of suicidal/homicidal ideations.  Patient verbalizes understanding of this education and agrees to this plan of care  Physical Findings: AIMS:  , ,  ,  ,    CIWA:    COWS:        Psychiatric Specialty Exam:  Presentation  General Appearance:  Appropriate for Environment; Casual  Eye Contact: Fair  Speech: Clear and Coherent  Speech Volume: Normal    Mood and Affect  Mood: Euthymic  Affect: Appropriate   Thought Process  Thought Processes: Coherent  Descriptions of Associations:Intact  Orientation:Full (Time, Place and Person)  Thought Content:Logical  Hallucinations:Hallucinations: None  Ideas of Reference:None  Suicidal Thoughts:Suicidal Thoughts: No  Homicidal Thoughts:Homicidal Thoughts: No   Sensorium  Memory: Immediate Fair; Recent Fair; Remote  Fair  Judgment: Fair  Insight: Fair   Art Therapist  Concentration: Fair  Attention Span: Fair  Recall: [employer] of Knowledge: Fair  Language: Fair   Psychomotor Activity  Psychomotor Activity: Psychomotor Activity: Normal  Musculoskeletal: Strength & Muscle Tone: within normal limits Gait & Station: normal Assets  Assets: Manufacturing Systems Engineer; Desire for Improvement   Sleep  Sleep: Sleep: Fair    Physical Exam: Physical Exam Vitals and nursing note reviewed.    ROS Blood pressure 107/64, pulse 93, temperature 98.2 F (36.8 C), temperature source Oral, resp. rate 18, height 5' 10 (1.778 m), weight 69.9 kg, SpO2 100%. Body mass index is 22.1 kg/m.   Social History   Tobacco Use  Smoking Status Some Days   Current packs/day: 0.25   Types: Cigarettes  Smokeless Tobacco Never  Tobacco Comments   1 -2 cigs a day for the past two months    Tobacco Cessation:  N/A, patient does not currently use tobacco products   Blood Alcohol level:  Lab Results  Component Value Date   [facility] <15 01/12/2024   ETH <10 06/20/2023    Metabolic Disorder Labs:  Lab Results  Component Value Date   HGBA1C 4.7 (L) 06/20/2023   MPG 88.19 06/20/2023   MPG 93.93 03/04/2023   Lab Results  Component Value Date   PROLACTIN 12.3 01/12/2024   Lab Results  Component Value Date   CHOL 231 (H) 06/20/2023   TRIG 221 (H) 06/20/2023   HDL 46 06/20/2023   CHOLHDL 5.0 06/20/2023   VLDL 44 (H) 06/20/2023   LDLCALC 141 (H) 06/20/2023    See Psychiatric Specialty Exam and Suicide Risk Assessment completed by Attending Physician prior to discharge.  Discharge destination:  Other:  IRC  Is patient on multiple antipsychotic therapies at discharge:  No   Has Patient had three or more failed trials of antipsychotic monotherapy by history:  No  Recommended Plan for Multiple Antipsychotic Therapies: NA   Allergies as of 01/22/2024       Reactions   Chicken  Allergy Other (See Comments)   Tears stomach up   Egg Protein-containing Drug Products Nausea And Vomiting        Medication List     STOP taking these medications    escitalopram  20 MG tablet Commonly known as: LEXAPRO    hydrOXYzine  25 MG tablet Commonly known as: ATARAX    omeprazole  40 MG capsule Commonly known as: PRILOSEC   ondansetron   4 MG disintegrating tablet Commonly known as: ZOFRAN -ODT   traZODone  50 MG tablet Commonly known as: DESYREL        TAKE these medications      Indication  albuterol  108 (90 Base) MCG/ACT inhaler Commonly known as: Ventolin  HFA Inhale 2 puffs into the lungs every 4 (four) hours as needed for wheezing or shortness of breath.  Indication: Asthma   ARIPiprazole 10 MG tablet Commonly known as: ABILIFY Take 1 tablet (10 mg total) by mouth daily.  Indication: Major Depressive Disorder   buPROPion  300 MG 24 hr tablet Commonly known as: WELLBUTRIN  XL Take 1 tablet (300 mg total) by mouth daily. What changed: Another medication with the same name was removed. Continue taking this medication, and follow the directions you see here.  Indication: Major Depressive Disorder   naproxen  250 MG tablet Commonly known as: NAPROSYN  Take 1 tablet (250 mg total) by mouth 2 (two) times daily with a meal.  Indication: Pain        Follow-up Information     [facility] Follow up.   Why: Walk in hours are Monday through Friday 8AM-1PM. Contact information: [address] [place] [address] [phone]         [facility] Follow up.   Why: Please follow up for appointment scheduling. Contact information: [address]  [address] [place] [address] [phone]         [facility] Follow up.   Specialty: Professional Counselor Why: Walk in hours are from 9AM to 1PM Monday through Friday in the [place] location and from 8:30AM -12:30PM and  1PM -2:30PM in the [facility]  location. Contact information: [facility] [address] [place]  [address] [place] [address] [phone]                 Follow-up recommendations:  Activity:  As tolerated    Signed: Michele Judy, MD 01/22/2024, 10:52 AM

## 2024-01-22 NOTE — Progress Notes (Signed)
 [facility] MD Progress Note  01/22/2024 9:04 PM Christopher Forbes  MRN:  [id number]   Subjective:  Chart reviewed, case discussed in multidisciplinary meeting, patient seen during rounds.   01/22/24: Patient was initially interviewed today by this provider as he was getting discharged today.  Patient denies SI/HI/plan and denies auditory/visual hallucinations.  He talked about going to [place]  with his friend after few days and finding a job.  He reports he was planning to settle in [place]  and get connected with outpatient mental health services.  Later in the day patient informed the team that his friend is not picking up the phone who was supposed to provide him ride to [place] .  Patient was offered [facility] shelter as an option and he got very distressed stating he cannot go to [facility].  Patient called his mother and some friends with no response.  Mother later on called back the team and said she cannot help him with housing or finding somebody to house him for few days.  Patient reports he will continue to look into and call some of his friends. 11/4: On interview today, patient is noted to be pleasant, calm and cooperative.  He denies SI/HI/plan and denies hallucinations.  He reports stable sleep and appetite.  He continues to respond well to medication regimen and denies adverse effects.  He reports improved depressive symptoms and anxiety.  He does report feeling angry today without identifiable cause.  He reports he has talked to his friend who he will be moving to [place]  with and is agreeable for discharge tomorrow, states friend able to pick him up.  Patient denies access to guns or other lethal means.  He is able to discuss support system, coping skills including listening to music, and crisis resources. Patient reported transient shortness of breath and chest tightness in the morning and when outside.  Patient reports history of asthma for which he uses albuterol  rescue inhaler.  Will order albuterol  inhaler  for as needed use and discontinue trazodone  as patient felt trazodone  may be exacerbating symptoms.  Patient denies current shortness of breath or chest tightness.  Counseled patient to inform staff if symptoms recur or if any other concerns arise.  11/3:  On interview today, patient is noted to be calm and cooperative, alert and oriented.  He denies SI/plan today.  He denies HI/plan and denies hallucinations.  Patient reports chronic suicidal ideation for several years.  He cites friends and family as protective factors.  He is tolerating current medication regimen well without adverse effects.  He is taking Abilify 10 mg and Wellbutrin  XL 300 mg.  Reports joint pain is controlled with naproxen .  He does not voice any concerns or complaints today.  11/2: On interview today, patient is noted to be pleasant, calm and cooperative.  He continues to report suicidal ideation without intent or plan though continues to state this is improving.  He is able to contract for safety.  He denies HI/plan.  He continues to report visual hallucinations but states these are also improving.  He denies auditory hallucinations.  He rates depression as 5 out of 10 and denies current anxiety.  He is tolerating current medication regimen well without adverse effects.  He is agreeable to Abilify dose increase.  He reports joint pain related to arthritis and states this continues to be well-controlled with naproxen .  11/1: On interview today, patient is noted to be calm and cooperative.  He reports persistent suicidal ideation without intent or plan,  though states this is improving overall.  He reports improving visual hallucinations.  He denies auditory hallucinations.  He denies HI/plan.  He rates depression as 5 out of 10 and anxiety is 5 out of 10.  Sleep and appetite remain stable.  Patient reports joint pain is well-controlled with naproxen .  10/31: On interview today, patient is noted to be calm and cooperative, alert and  oriented, and more talkative today.  He is able to discuss coping skills, crisis resources, and support system in his friend and brother.  Upon hospital discharge, patient intends to move to [place]  with a friend.  Patient is looking forward to this transition.  He reports stable sleep and appetite.  He rates depression as 6 out of 10 today.  He denies current anxiety.  He endorses suicidal ideation without intent or plan today though notes this has been improving overall.  He is able to contract for safety.  He denies HI/plan.  He reports improving visual hallucinations, stating he saw 1 shadow figure today and 1 black blur this morning.  He denies auditory hallucinations.  He reports tolerating current medication regimen well without adverse effects.  He reports hip pain is controlled with Tylenol  and naproxen , will continue to monitor.  10/30: On interview today, patient is noted to be calm and cooperative.  He reports taking hydroxyzine  last night but feels that it increased feelings of panic and anxiety.  Will discontinue hydroxyzine  for this reason.  He reports otherwise tolerating medication regimen well.  Patient rates depression as 5 out of 10 and anxiety is 4 out of 10 today.    He endorses suicidal ideation without intent or plan this morning but denies suicidal ideation at time of interview.  He states he is trying to keep his mind busy. He is agreeable to Wellbutrin  XL dose increase, states he was previously taking 300 mg with good effect and tolerability.  He denies HI/plan.  He denies auditory hallucinations.  He reports improved visual hallucinations, reports 1 episode of seeing a figure moving by the window today.  He reports hip pain has improved with naproxen .  He is no longer noted to be walking with a limp.  He denies redness or swelling of joints.  10/29: On interview today, patient is noted to be calm and cooperative, alert and oriented.  He is tolerating current medication regimen well  without adverse effects.  He rates depression as 6 out of 10 and anxiety as 6 out of 10 today.  He endorses suicidal ideation without intent or plan.  He is able to contract for safety.  He has not been observed engaging in self-harm behaviors.  He denies HI/plan.  He endorses visual hallucinations but states these have somewhat improved.  He denies current auditory hallucinations.  He is agreeable to Abilify dose increased at this time.  Shortly after dinner today patient reported left hip pain to nurse, who then informed provider.  Provider returned to interview patient regarding pain.  Patient reports left hip pain that started around dinnertime this evening.  He is noted to be walking with a limp.  He denies fall or acute trauma.  He denies previous injury to this site.  He denies pain in any other sites.  He reports history of arthritis but states this does not feel like a typical arthritis flare.  Vital signs are within normal limits. Per nursing report, patient requested narcotic pain medication and declined Tylenol  or ibuprofen . Will add naproxen  250 mg twice daily  and continue to monitor. Patient advised to alert staff is pain is worsening, or if other symptoms such as swelling, increased warmth, or redness are noted.   10/28: On interview today, patient is noted to be interacting appropriately with peers in the milieu.  He returns to his room to engage in interview with provider.  He is noted to be calm and cooperative, alert and oriented.  When asked about suicidal ideation patient replies a little, and when asked about intent or plan, patient replies not yet.  Patient denies HI/plan.  He endorses visual hallucinations involving a black blur and shadow figures.  He endorses previous auditory hallucinations approximately 3 days ago but not currently.  Auditory hallucinations involve a deep, rough-sounding male voice calling patient's name.  He states he only ever hears name called and does not  recognize the voice.  He denies command hallucinations.  He states he hears auditory hallucinations approximately twice every couple of days.  He rates depression at 7 out of 10 and anxiety at 6 out of 10 today.  He is tolerating current medication regimen well without adverse effects.  He states he prefers to discontinue Lexapro  due to previous ineffectiveness at 20 mg dose.  He requests to restart Wellbutrin , which he states was effective in the past and well-tolerated.  He denies history of seizures, anorexia nervosa, or bulimia.   Sleep: Fair  Appetite:  Fair  Past Psychiatric History: see h&P Family History:  Family History  Problem Relation Age of Onset   Migraines Mother    Diabetes Father    Hyperlipidemia Father    Hypertension Father    COPD Father    Asthma Father    Migraines Brother    Stroke Maternal Grandmother    Colon cancer Neg Hx    Esophageal cancer Neg Hx    Rectal cancer Neg Hx    Stomach cancer Neg Hx    Social History:  Social History   Substance and Sexual Activity  Alcohol Use No   Alcohol/week: 0.0 standard drinks of alcohol     Social History   Substance and Sexual Activity  Drug Use Yes   Types: Marijuana, Other-see comments   Comment: opiates    Social History   Socioeconomic History   Marital status: Single    Spouse name: Not on file   Number of children: Not on file   Years of education: Not on file   Highest education level: 11th grade  Occupational History   Occupation: consulting civil engineer  Tobacco Use   Smoking status: Some Days    Current packs/day: 0.25    Types: Cigarettes   Smokeless tobacco: Never   Tobacco comments:    1 -2 cigs a day for the past two months   Vaping Use   Vaping status: Former  Substance and Sexual Activity   Alcohol use: No    Alcohol/week: 0.0 standard drinks of alcohol   Drug use: Yes    Types: Marijuana, Other-see comments    Comment: opiates   Sexual activity: Yes    Partners: Male  Other Topics  Concern   Not on file  Social History Narrative   Patient lives with brother step sister and step dad Transport Planner. Mom is involved. Right handed      06/21/2023 Pt states I live on the street, I don't live with my step dad   Social Drivers of Health   Financial Resource Strain: High Risk (02/25/2023)   Overall Financial Resource Strain (CARDIA)  Difficulty of Paying Living Expenses: Very hard  Food Insecurity: No Food Insecurity (01/12/2024)   Hunger Vital Sign    Worried About Running Out of Food in the Last Year: Never true    Ran Out of Food in the Last Year: Never true  Recent Concern: Food Insecurity - Food Insecurity Present (01/12/2024)   Hunger Vital Sign    Worried About Running Out of Food in the Last Year: Sometimes true    Ran Out of Food in the Last Year: Sometimes true  Transportation Needs: No Transportation Needs (01/12/2024)   PRAPARE - Administrator, Civil Service (Medical): No    Lack of Transportation (Non-Medical): No  Physical Activity: Insufficiently Active (02/25/2023)   Exercise Vital Sign    Days of Exercise per Week: 3 days    Minutes of Exercise per Session: 30 min  Stress: Stress Concern Present (02/25/2023)   [employer] of Occupational Health - Occupational Stress Questionnaire    Feeling of Stress : To some extent  Social Connections: Socially Isolated (02/25/2023)   Social Connection and Isolation Panel    Frequency of Communication with Friends and Family: Twice a week    Frequency of Social Gatherings with Friends and Family: Once a week    Attends Religious Services: Never    Database Administrator or Organizations: No    Attends Engineer, Structural: Not on file    Marital Status: Separated   Past Medical History:  Past Medical History:  Diagnosis Date   Acne    Allergy    Anxiety    Asthma    Chronic headaches    Depression    GERD (gastroesophageal reflux disease)     Past Surgical History:  Procedure  Laterality Date   OPEN REDUCTION INTERNAL FIXATION (ORIF) METACARPAL Right 11/19/2018   Procedure: Right small finger metacarpal open malunion repair;  Surgeon: Shari Easter, MD;  Location: [facility];  Service: Orthopedics;  Laterality: Right;  90 minutes   TYMPANOSTOMY TUBE PLACEMENT Bilateral    placed age 67 have fallen out   UPPER GASTROINTESTINAL ENDOSCOPY     WISDOM TOOTH EXTRACTION  04/04/2021    Current Medications: Current Facility-Administered Medications  Medication Dose Route Frequency Provider Last Rate Last Admin   acetaminophen  (TYLENOL ) tablet 650 mg  650 mg Oral Q6H PRN Dasie Ellouise CROME, FNP   650 mg at 01/17/24 1559   albuterol  (VENTOLIN  HFA) 108 (90 Base) MCG/ACT inhaler 2 puff  2 puff Inhalation Q4H PRN Hunter, Crystal L, PA-C   2 puff at 01/21/24 1117   alum & mag hydroxide-simeth (MAALOX/MYLANTA) 200-200-20 MG/5ML suspension 30 mL  30 mL Oral Q4H PRN Dasie Ellouise CROME, FNP       ARIPiprazole (ABILIFY) tablet 10 mg  10 mg Oral Daily Hunter, Crystal L, PA-C   10 mg at 01/22/24 9183   buPROPion  (WELLBUTRIN  XL) 24 hr tablet 300 mg  300 mg Oral Daily Hunter, Crystal L, PA-C   300 mg at 01/22/24 9183   haloperidol  (HALDOL ) tablet 5 mg  5 mg Oral TID PRN Dasie Ellouise CROME, FNP       And   diphenhydrAMINE  (BENADRYL ) capsule 50 mg  50 mg Oral TID PRN Dasie Ellouise CROME, FNP       haloperidol  lactate (HALDOL ) injection 5 mg  5 mg Intramuscular TID PRN Dasie Ellouise CROME, FNP       And   diphenhydrAMINE  (BENADRYL ) injection 50 mg  50 mg  Intramuscular TID PRN Dasie Ellouise CROME, FNP       And   LORazepam  (ATIVAN ) injection 2 mg  2 mg Intramuscular TID PRN Dasie Ellouise CROME, FNP       haloperidol  lactate (HALDOL ) injection 10 mg  10 mg Intramuscular TID PRN Dasie Ellouise CROME, FNP       And   diphenhydrAMINE  (BENADRYL ) injection 50 mg  50 mg Intramuscular TID PRN Dasie Ellouise CROME, FNP       And   LORazepam  (ATIVAN ) injection 2 mg  2 mg Intramuscular TID PRN Allen, Tina L, FNP       magnesium   hydroxide (MILK OF MAGNESIA) suspension 30 mL  30 mL Oral Daily PRN Dasie Ellouise CROME, FNP       naproxen  (NAPROSYN ) tablet 250 mg  250 mg Oral BID WC Hunter, Crystal L, PA-C   250 mg at 01/22/24 1608    Lab Results:  No results found for this or any previous visit (from the past 48 hours).   Blood Alcohol level:  Lab Results  Component Value Date   [facility] <15 01/12/2024   ETH <10 06/20/2023    Metabolic Disorder Labs: Lab Results  Component Value Date   HGBA1C 4.7 (L) 06/20/2023   MPG 88.19 06/20/2023   MPG 93.93 03/04/2023   Lab Results  Component Value Date   PROLACTIN 12.3 01/12/2024   Lab Results  Component Value Date   CHOL 231 (H) 06/20/2023   TRIG 221 (H) 06/20/2023   HDL 46 06/20/2023   CHOLHDL 5.0 06/20/2023   VLDL 44 (H) 06/20/2023   LDLCALC 141 (H) 06/20/2023    Physical Findings: AIMS:  , ,  ,  ,    CIWA:    COWS:      Psychiatric Specialty Exam:  Presentation  General Appearance:  Disheveled  Eye Contact: Fair  Speech: Clear and Coherent  Speech Volume: Normal    Mood and Affect  Mood: Euthymic  Affect: Congruent   Thought Process  Thought Processes: Coherent  Orientation:Full (Time, Place and Person)  Thought Content:Logical  Hallucinations: Visual  Ideas of Reference:None  Suicidal Thoughts: No  Homicidal Thoughts: No  Sensorium  Memory: Immediate Fair; Recent Fair; Remote Fair  Judgment: Fair  Insight: Fair   Art Therapist  Concentration: Fair  Attention Span: Fair  Recall: [employer] of Knowledge: Fair  Language: Fair   Psychomotor Activity  Psychomotor Activity: Normal Musculoskeletal: Strength & Muscle Tone: within normal limits Gait & Station: normal Assets  Assets: Manufacturing Systems Engineer; Desire for Improvement    Physical Exam: Physical Exam Vitals and nursing note reviewed.    ROS Blood pressure 120/83, pulse 94, temperature 98.5 F (36.9 C), temperature source Oral,  resp. rate 18, height 5' 10 (1.778 m), weight 69.9 kg, SpO2 99%. Body mass index is 22.1 kg/m.  Diagnosis: Principal Problem:   MDD (major depressive disorder), recurrent episode, severe (HCC)   PLAN: Safety and Monitoring:  -- Voluntary admission to inpatient psychiatric unit for safety, stabilization and treatment  -- Daily contact with patient to assess and evaluate symptoms and progress in treatment  -- Patient's case to be discussed in multi-disciplinary team meeting  -- Observation Level : q15 minute checks  -- Vital signs:  q12 hours  -- Precautions: suicide, elopement, and assault -- Encouraged patient to participate in unit milieu and in scheduled group therapies   2. Psychiatric Treatment:  Scheduled Medications: Discontinued Lexapro  due to past ineffectiveness at 20 mg dose per  patient report Wellbutrin  XL 300 mg once daily, patient reports this was previously effective and well-tolerated  Abilify 10 mg once daily Discontinued trazodone  due to potential asthma exacerbation per patient report   -- The risks/benefits/side-effects/alternatives to this medication were discussed in detail with the patient and time was given for questions. The patient consents to medication trial.   3. Medical Issues Being Addressed:  Naproxen  250 mg twice daily for joint pain, will continue to monitor  Albuterol  inhaler as needed for asthma exacerbation   4. Discharge Planning:             -- Wednesday  -- Social work and case management to assist with discharge planning and identification of hospital follow-up needs prior to discharge  -- Estimated LOS: 7-10 days  Allyn Foil, MD 01/22/2024, 9:04 PM

## 2024-01-22 NOTE — BHH Suicide Risk Assessment (Signed)
 [facility] Discharge Suicide Risk Assessment   Principal Problem: MDD (major depressive disorder), recurrent episode, severe (HCC) Discharge Diagnoses: Principal Problem:   MDD (major depressive disorder), recurrent episode, severe (HCC)   Total Time spent with patient: 30 minutes  Musculoskeletal: Strength & Muscle Tone: within normal limits Gait & Station: normal Patient leans: N/A  Psychiatric Specialty Exam  Presentation  General Appearance:  Appropriate for Environment; Casual  Eye Contact: Fair  Speech: Clear and Coherent  Speech Volume: Normal  Handedness: Right   Mood and Affect  Mood: Euthymic  Duration of Depression Symptoms: Greater than two weeks  Affect: Appropriate   Thought Process  Thought Processes: Coherent  Descriptions of Associations:Intact  Orientation:Full (Time, Place and Person)  Thought Content:Logical  History of Schizophrenia/Schizoaffective disorder:No  Duration of Psychotic Symptoms:No data recorded Hallucinations:Hallucinations: None  Ideas of Reference:None  Suicidal Thoughts:Suicidal Thoughts: No  Homicidal Thoughts:Homicidal Thoughts: No   Sensorium  Memory: Immediate Fair; Recent Fair; Remote Fair  Judgment: Fair  Insight: Fair   Art Therapist  Concentration: Fair  Attention Span: Fair  Recall: [employer] of Knowledge: Fair  Language: Fair   Psychomotor Activity  Psychomotor Activity: Psychomotor Activity: Normal   Assets  Assets: Communication Skills; Desire for Improvement   Sleep  Sleep: Sleep: Fair  Estimated Sleeping Duration (Last 24 Hours): 7.25-8.75 hours (Due to Daylight Saving Time, the durations displayed may not accurately represent documentation during the time change interval)  Physical Exam: Physical Exam Vitals and nursing note reviewed.    ROS Blood pressure 107/64, pulse 93, temperature 98.2 F (36.8 C), temperature source Oral, resp. rate 18,  height 5' 10 (1.778 m), weight 69.9 kg, SpO2 100%. Body mass index is 22.1 kg/m.  Mental Status Per Nursing Assessment::   On Admission:  Suicide plan  Demographic Factors:  Low socioeconomic status  Loss Factors: Decrease in vocational status  Historical Factors: Impulsivity  Risk Reduction Factors:   Living with another person, especially a relative, Positive social support, Positive therapeutic relationship, and Positive coping skills or problem solving skills  Continued Clinical Symptoms:  Depression:   Impulsivity  Cognitive Features That Contribute To Risk:  None    Suicide Risk:  Minimal: No identifiable suicidal ideation.  Patients presenting with no risk factors but with morbid ruminations; may be classified as minimal risk based on the severity of the depressive symptoms   Follow-up Information     [facility] Follow up.   Why: Walk in hours are Monday through Friday 8AM-1PM. Contact information: [address] [place] [address] [phone]         [facility] Follow up.   Why: Please follow up for appointment scheduling. Contact information: [address]  [address] [place] [address] [phone]         [facility] Follow up.   Specialty: Professional Counselor Why: Walk in hours are from 9AM to 1PM Monday through Friday in the [place] location and from 8:30AM -12:30PM and  1PM -2:30PM in the [facility] location. Contact information: [facility] [address] [place]  [address] [place] [address] [phone]                 Plan Of Care/Follow-up recommendations:  Activity:  as tolearted  Allyn Foil, MD 01/22/2024, 10:51 AM

## 2024-01-22 NOTE — Progress Notes (Signed)
   01/22/24 1832  Psychosocial Assessment  Patient Complaints Anxiety  Eye Contact Brief  Facial Expression Anxious  Affect Anxious  Speech Logical/coherent  Interaction Assertive  Motor Activity Fidgety  Appearance/Hygiene Unremarkable  Behavior Characteristics Cooperative  Mood Anxious  Thought Process  Coherency WDL  Content WDL  Delusions None reported or observed  Perception WDL  Hallucination None reported or observed  Judgment Poor  Confusion None

## 2024-01-22 NOTE — Group Note (Signed)
 Date:  01/22/2024 Time:  6:26 PM  Group Topic/Focus:  Activity Group: The focus of the is to encourage patients to go outside to the courtyard and get some fresh air and some exercise.    Participation Level:  Active  Participation Quality:  Appropriate  Affect:  Appropriate  Cognitive:  Appropriate  Insight: Appropriate  Engagement in Group:  Engaged  Modes of Intervention:  Activity  Additional Comments:    Christopher Forbes 01/22/2024, 6:26 PM

## 2024-01-22 NOTE — Progress Notes (Signed)
   01/22/24 2200  Psych Admission Type (Psych Patients Only)  Admission Status Voluntary  Psychosocial Assessment  Patient Complaints Anxiety  Eye Contact Brief  Facial Expression Anxious  Affect Anxious  Speech Logical/coherent  Interaction Assertive  Motor Activity Fidgety  Appearance/Hygiene Unremarkable  Behavior Characteristics Cooperative  Mood Anxious  Thought Process  Coherency WDL  Content WDL  Delusions None reported or observed  Perception WDL  Hallucination None reported or observed  Judgment Poor  Confusion None  Danger to Self  Current suicidal ideation? Denies  Agreement Not to Harm Self Yes

## 2024-01-22 NOTE — BHH Counselor (Signed)
 [name] met with the patient to discuss nurses report that the patient's ride fell through.  [name] explained that a ride can be provided to the [facility].  Patient became anxious stating that he feared for his safety at the [facility].   Pt requested to be dropped off at the [facility] and [name] explained at this time that a ride can not be provided to any place but the shelter where patient can get additional resources, unless pt can identify a friend or family member to provide transportation or confirm that patient can stay there.    Patient remained adamant that his safety would be at risk if he goes to the [facility].  [name] informed provider.  [name], provider and patient met to discuss further.   Patient stated his concerns again, however, plan remained for pt to discharge to the [facility].   [name] assisted patient in calling his peer and his mother.  Peer, who was supposed to pick the patient up, did not answer the phone.  Mother answered the phone but reported that she would not be able to provide patient with transportation.  [name] inquired if mother would be able to allow pt to stay at one of her friends homes.  Mother reported that she would call back with the answer.  [name] spoke with patient on if patient can stay with mother and pt reported that his step-father has a trespass order against the patient.  Mother called back to state that her peers will not be able to loan her a car for transportation nor allow pt to stay with them.  [name] informed the patient.   Patient became upset stating that he would die at the shelter.  [name] attempted to assist patient in deescalating, however, pt was not receptive.    [name] encouraged patient to continue attempting to reach the peer for an alternative to the plan.  Sherryle Margo, MSW, LCSW 01/22/2024 3:16 PM

## 2024-01-22 NOTE — Plan of Care (Signed)
  Problem: Education: Goal: Knowledge of [facility] [place] Education information/materials will improve Outcome: Progressing Goal: Emotional status will improve Outcome: Progressing Goal: Mental status will improve Outcome: Progressing   Problem: Activity: Goal: Interest or engagement in activities will improve Outcome: Progressing   Problem: Coping: Goal: Ability to verbalize frustrations and anger appropriately will improve Outcome: Progressing   Problem: Safety: Goal: Periods of time without injury will increase Outcome: Progressing

## 2024-01-22 NOTE — Progress Notes (Signed)
  [facility] Adult Case Management Discharge Plan :  Will you be returning to the same living situation after discharge:  No. Pt reports that he and a peer will be traveling to [place] .  At discharge, do you have transportation home?: Yes,  pt reports that a peer will provide transportation.  Do you have the ability to pay for your medications: Yes,  [employer] / [employer]  Release of information consent forms completed and in the chart;  Patient's signature needed at discharge.  Patient to Follow up at:  Follow-up Information     [facility] Follow up.   Why: Walk in hours are Monday through Friday 8AM-1PM. Contact information: [address] [place] [address] [phone]         [facility] Follow up.   Why: Please follow up for appointment scheduling. Contact information: [address]  [address] [place] [address] [phone]         [facility] Follow up.   Specialty: Professional Counselor Why: Walk in hours are from 9AM to 1PM Monday through Friday in the [place] location and from 8:30AM -12:30PM and  1PM -2:30PM in the [facility] location. Contact information: [facility] [address]  [address] [place] [address] [phone]                 Next level of care provider has access to [facility] Link:no  Safety Planning and Suicide Prevention discussed: Yes,  SPE completed with the patient.  Patient declined collateral contact initially.  Later, pt provided permission to speak with his mother, however, calls from this writer were not successful.      Has patient been referred to the Quitline?: Patient refused referral for treatment  Patient has been referred for addiction treatment: Patient refused referral for treatment; referral information given to patient at discharge.  Sherryle JINNY Margo, LCSW 01/22/2024, 8:28 AM

## 2024-01-23 NOTE — Group Note (Signed)
 Date:  01/23/2024 Time:  1:07 AM  Group Topic/Focus:  Self Esteem Action Plan:   The focus of this group is to help patients create a plan to continue to build self-esteem after discharge.    Participation Level:  Active  Participation Quality:  Appropriate and Attentive  Affect:  Appropriate  Cognitive:  Alert and Appropriate  Insight: Appropriate  Engagement in Group:  Engaged and Improving  Modes of Intervention:  Clarification, Discussion, Education, Orientation, Rapport Building, and Support  Additional Comments:     Leinaala Catanese 01/23/2024, 1:07 AM

## 2024-01-23 NOTE — Plan of Care (Signed)
  Problem: Education: Goal: Emotional status will improve Outcome: Adequate for Discharge   Problem: Education: Goal: Mental status will improve Outcome: Adequate for Discharge   Problem: Education: Goal: Verbalization of understanding the information provided will improve Outcome: Adequate for Discharge   Problem: Coping: Goal: Ability to verbalize frustrations and anger appropriately will improve Outcome: Adequate for Discharge   Problem: Coping: Goal: Ability to demonstrate self-control will improve Outcome: Adequate for Discharge   Problem: Physical Regulation: Goal: Ability to maintain clinical measurements within normal limits will improve Outcome: Adequate for Discharge

## 2024-01-23 NOTE — Progress Notes (Signed)
 Patient denies SI/HI/AVH at this time. Discharge instructions, AVS, prescriptions, and transition record reviewed with patient. Patient agrees to comply with medication management, follow-up visit and outpatient therapy. Patient belongings returned to patient. Patient questions and concerns addressed and answered.  Patient ambulatory off unit. Patient discharged via Taxi to [facility].

## 2024-01-23 NOTE — Progress Notes (Signed)
  [facility] Adult Case Management Discharge Plan :  Will you be returning to the same living situation after discharge:  Yes,  [name] to assist patient in discharging to the [facility]. At discharge, do you have transportation home?: Yes,  [name] to assist with transportation needs.  Do you have the ability to pay for your medications: Yes,  [employer] PLAN / [employer]AN  Release of information consent forms completed and in the chart;  Patient's signature needed at discharge.  Patient to Follow up at:  Follow-up Information     [facility] Follow up.   Why: Walk in hours are Monday through Friday 8AM-1PM. Contact information: [address] [place] [address] [phone]         [facility] Follow up.   Why: Please follow up for appointment scheduling. Contact information: [address]  [address] [place] [address] [phone]         [facility] Follow up.   Specialty: Professional Counselor Why: Walk in hours are from 9AM to 1PM Monday through Friday in the [place] location and from 8:30AM -12:30PM and  1PM -2:30PM in the [facility] location. Contact information: [facility] [address]  [address] [place] [address] [phone]                 Next level of care provider has access to [facility] Link:no  Safety Planning and Suicide Prevention discussed: Yes,  SPE completed with the patient.  Collateral contacts with the patient's mother were not successful.      Has patient been referred to the Quitline?: Patient refused referral for treatment  Patient has been referred for addiction treatment: Patient refused referral for treatment; referral information given to patient at discharge.  Sherryle JINNY Margo, LCSW 01/23/2024, 8:39 AM

## 2024-01-23 NOTE — Group Note (Signed)
 Date:  01/23/2024 Time:  10:23 AM  Group Topic/Focus:  Personal Choices and Values:   The focus of this group is to help patients assess and explore the importance of values in their lives, how their values affect their decisions, how they express their values and what opposes their expression.    Participation Level:  Active  Participation Quality:  Appropriate  Affect:  Appropriate  Cognitive:  Appropriate  Insight: Appropriate  Engagement in Group:  Engaged  Modes of Intervention:  Activity  Additional Comments:    Camellia HERO Sherard Sutch 01/23/2024, 10:23 AM

## 2024-01-24 ENCOUNTER — Emergency Department (HOSPITAL_COMMUNITY)
Admission: EM | Admit: 2024-01-24 | Discharge: 2024-01-24 | Disposition: A | Discharge status: Home / Self Care | Payer: MEDICAID | Attending: Emergency Medicine | Admitting: Emergency Medicine

## 2024-01-24 DIAGNOSIS — J45909 Unspecified asthma, uncomplicated: Secondary | ICD-10-CM | POA: Diagnosis not present

## 2024-01-24 DIAGNOSIS — R45851 Suicidal ideations: Secondary | ICD-10-CM | POA: Insufficient documentation

## 2024-01-24 DIAGNOSIS — F332 Major depressive disorder, recurrent severe without psychotic features: Secondary | ICD-10-CM | POA: Diagnosis not present

## 2024-01-24 DIAGNOSIS — R42 Dizziness and giddiness: Secondary | ICD-10-CM | POA: Diagnosis present

## 2024-01-24 LAB — CBC WITH DIFFERENTIAL/PLATELET
Abs Immature Granulocytes: 0.03 K/uL (ref 0.00–0.07)
Basophils Absolute: 0 K/uL (ref 0.0–0.1)
Basophils Relative: 1 %
Eosinophils Absolute: 0 K/uL (ref 0.0–0.5)
Eosinophils Relative: 0 %
HCT: 50.3 % (ref 39.0–52.0)
Hemoglobin: 16.1 g/dL (ref 13.0–17.0)
Immature Granulocytes: 0 %
Lymphocytes Relative: 17 %
Lymphs Abs: 1.2 K/uL (ref 0.7–4.0)
MCH: 29.5 pg (ref 26.0–34.0)
MCHC: 32 g/dL (ref 30.0–36.0)
MCV: 92.1 fL (ref 80.0–100.0)
Monocytes Absolute: 0.6 K/uL (ref 0.1–1.0)
Monocytes Relative: 8 %
Neutro Abs: 5.2 K/uL (ref 1.7–7.7)
Neutrophils Relative %: 74 %
Platelets: 219 K/uL (ref 150–400)
RBC: 5.46 MIL/uL (ref 4.22–5.81)
RDW: 12.3 % (ref 11.5–15.5)
WBC: 7.1 K/uL (ref 4.0–10.5)
nRBC: 0 % (ref 0.0–0.2)

## 2024-01-24 LAB — BASIC METABOLIC PANEL WITH GFR
Anion gap: 12 (ref 5–15)
BUN: 15 mg/dL (ref 6–20)
CO2: 26 mmol/L (ref 22–32)
Calcium: 9.8 mg/dL (ref 8.9–10.3)
Chloride: 103 mmol/L (ref 98–111)
Creatinine, Ser: 0.87 mg/dL (ref 0.61–1.24)
GFR, Estimated: >60 mL/min (ref >60)
Glucose, Bld: 96 mg/dL (ref 70–99)
Potassium: 4.4 mmol/L (ref 3.5–5.1)
Sodium: 141 mmol/L (ref 135–145)

## 2024-01-24 LAB — CBG MONITORING, ED: Glucose-Capillary: 95 mg/dL (ref 70–99)

## 2024-01-24 MED ORDER — NAPROXEN 500 MG PO TABS
250.0000 mg | ORAL_TABLET | Freq: Two times a day (BID) | ORAL | Status: DC
Start: 2024-01-24 — End: 2024-01-24

## 2024-01-24 MED ORDER — ARIPIPRAZOLE 5 MG PO TABS
10.0000 mg | ORAL_TABLET | Freq: Every day | ORAL | Status: DC
  Administered 2024-01-24: 10 mg via ORAL
  Filled 2024-01-24: qty 2

## 2024-01-24 MED ORDER — BUPROPION HCL ER (XL) 150 MG PO TB24
300.0000 mg | ORAL_TABLET | Freq: Every day | ORAL | Status: DC
  Administered 2024-01-24: 300 mg via ORAL
  Filled 2024-01-24: qty 2

## 2024-01-24 MED ORDER — ACETAMINOPHEN 325 MG PO TABS
650.0000 mg | ORAL_TABLET | Freq: Once | ORAL | Status: AC
  Administered 2024-01-24: 650 mg via ORAL
  Filled 2024-01-24: qty 2

## 2024-01-24 MED ORDER — ALBUTEROL SULFATE HFA 108 (90 BASE) MCG/ACT IN AERS
2.0000 | INHALATION_SPRAY | RESPIRATORY_TRACT | Status: DC | PRN
Start: 2024-01-24 — End: 2024-01-24

## 2024-01-24 NOTE — ED Provider Notes (Addendum)
 [facility] EMERGENCY DEPARTMENT AT [facility] Provider Note   CSN: [id number] Arrival date & time: 01/24/24  9041     Patient presents with: Psychiatric Evaluation and Fatigue   Christopher Forbes is a 23 y.o. male.   Patient is a 23 year old male with a past medical history of asthma, depression and anxiety presenting to the emergency department with dizziness.  The patient states that he is chronic arthritis in his knee and his hip and took some naproxen  last night that he has been taking as needed.  He states when he woke up this morning he felt very lightheaded and dizzy with some mild nausea.  He states that he felt disoriented and is unsure if it was a side effect from the naproxen .  States he did have breakfast yesterday but did not eat anything else the rest of the day as he did not have access to food.  Patient also reports that he was having suicidal thoughts last night with a plan to jump off of a bridge.  He states he is no longer feeling suicidal this morning.  Of note he does report that he was just discharged from [facility] for SI yesterday morning.  The history is provided by the patient.       Prior to Admission medications   Medication Sig Start Date End Date Taking? Authorizing Provider  albuterol  (VENTOLIN  HFA) 108 (90 Base) MCG/ACT inhaler Inhale 2 puffs into the lungs every 4 (four) hours as needed for wheezing or shortness of breath. 01/21/24  Yes Hunter, Crystal L, PA-C  naproxen  (NAPROSYN ) 250 MG tablet Take 1 tablet (250 mg total) by mouth 2 (two) times daily with a meal. 01/21/24  Yes Hunter, Crystal L, PA-C  ARIPiprazole (ABILIFY) 10 MG tablet Take 1 tablet (10 mg total) by mouth daily. Patient not taking: Reported on 01/24/2024 01/22/24   Katrinka Camelia CROME, PA-C  buPROPion  (WELLBUTRIN  XL) 300 MG 24 hr tablet Take 1 tablet (300 mg total) by mouth daily. Patient not taking: Reported on 01/24/2024 01/22/24   Katrinka Camelia L, PA-C    Allergies: Chicken allergy  and Egg protein-containing drug products    Review of Systems  Updated Vital Signs BP 133/72 (BP Location: Right Arm)   Pulse 84   Temp 98 F (36.7 C) (Oral)   Resp 16   SpO2 100%   Physical Exam Vitals and nursing note reviewed.  Constitutional:      General: He is not in acute distress.    Appearance: Normal appearance.  HENT:     Head: Normocephalic and atraumatic.     Nose: Nose normal.     Mouth/Throat:     Mouth: Mucous membranes are moist.     Pharynx: Oropharynx is clear.  Eyes:     Extraocular Movements: Extraocular movements intact.     Conjunctiva/sclera: Conjunctivae normal.  Cardiovascular:     Rate and Rhythm: Normal rate and regular rhythm.     Heart sounds: Normal heart sounds.  Pulmonary:     Effort: Pulmonary effort is normal.     Breath sounds: Normal breath sounds.  Abdominal:     General: Abdomen is flat.     Palpations: Abdomen is soft.     Tenderness: There is no abdominal tenderness.  Musculoskeletal:        General: Normal range of motion.     Cervical back: Normal range of motion.  Skin:    General: Skin is warm and dry.  Neurological:  General: No focal deficit present.     Mental Status: He is alert and oriented to person, place, and time.  Psychiatric:        Mood and Affect: Mood normal.        Behavior: Behavior normal.     (all labs ordered are listed, but only abnormal results are displayed) Labs Reviewed  BASIC METABOLIC PANEL WITH GFR  CBC WITH DIFFERENTIAL/PLATELET  CBG MONITORING, ED    EKG: EKG Interpretation Date/Time:  Friday January 24 2024 10:42:37 EST Ventricular Rate:  96 PR Interval:  147 QRS Duration:  87 QT Interval:  335 QTC Calculation: 424 R Axis:   94  Text Interpretation: Sinus rhythm Borderline right axis deviation ST elev, probable normal early repol pattern Artifact in II, III otherwise no significant change from last EKG Confirmed by Ellouise Fine ([id number]) on 01/24/2024 10:45:26  AM  Radiology: No results found.   Procedures   Medications Ordered in the ED  albuterol  (VENTOLIN  HFA) 108 (90 Base) MCG/ACT inhaler 2 puff (has no administration in time range)  ARIPiprazole (ABILIFY) tablet 10 mg (10 mg Oral Given 01/24/24 1215)  buPROPion  (WELLBUTRIN  XL) 24 hr tablet 300 mg (300 mg Oral Given 01/24/24 1215)  naproxen  (NAPROSYN ) tablet 250 mg (has no administration in time range)  acetaminophen  (TYLENOL ) tablet 650 mg (650 mg Oral Given 01/24/24 1219)    Clinical Course as of 01/24/24 1351  Fri Jan 24, 2024  1136 Labs within normal range. Suspect dizziness is from lack of food. He is medically cleared for psych eval. [VK]  1348 Patient cleared by psychiatry for outpatient follow up. [VK]    Clinical Course User Index [VK] Kingsley, Adamariz Gillott K, DO                                 Medical Decision Making This patient presents to the ED with chief complaint(s) of dizziness, SI with pertinent past medical history of asthma, depression, anxiety which further complicates the presenting complaint. The complaint involves an extensive differential diagnosis and also carries with it a high risk of complications and morbidity.    The differential diagnosis includes dehydration, electrolyte abnormality, arrhythmia, anemia, hypo or hyperglycemia, hunger, medication side effect, SI  Additional history obtained: Additional history obtained from N/A Records reviewed previous admission documents  ED Course and Reassessment: On patient's arrival he is hemodynamically stable in no acute distress.  Patient will have EKG and labs performed and will be given something to eat to evaluate for cause of his dizziness.  He is not actively suicidal but is agreeable for psychiatry evaluation  Independent labs interpretation:  The following labs were independently interpreted: Within normal range  Independent visualization of imaging: - N/A  Consultation: - Consulted or discussed  management/test interpretation w/ external professional: TTS  Social Determinants of health: Homeless    Amount and/or Complexity of Data Reviewed Labs: ordered.  Risk OTC drugs. Prescription drug management.       Final diagnoses:  Dizziness  Suicidal ideation    ED Discharge Orders     None          Kingsley, Jelisa Greenwood K, DO 01/24/24 1211    Kingsley, Kamdyn Colborn K, DO 01/24/24 1351

## 2024-01-24 NOTE — ED Notes (Signed)
 Patient ate a ham sandwich and drank a coke.

## 2024-01-24 NOTE — Discharge Instructions (Signed)
 You were seen in the emergency department for your dizziness and suicidal ideation.  The dizziness is likely related to not eating and drinking enough in the last 24 hours and make sure that you are not taking your naproxen  on an empty stomach.  Your blood work was all normal.  Psychiatry evaluated you for your suicidal ideation and made a safety plan with you and you are stable to follow-up with them as an outpatient.  You should follow-up with your primary doctor and can go to the behavioral health urgent care as needed for any mental health issues.  He should return to the emergency department if having further suicidal thoughts, you have worsening dizziness and pass out or if you have any other new or concerning symptoms.

## 2024-01-24 NOTE — ED Triage Notes (Signed)
 Took 1 naproxen  last night. Woke up with dizziness and malaise. Concern it is related to the naproxen .  SI within the last 24 hours. Released from [facility] yesterday after being there 11 days.

## 2024-01-24 NOTE — Consult Note (Signed)
 [facility] Psychiatric Consult Initial  Patient Name: .Christopher Forbes  MRN: [id number]  DOB: May 16, 2000  Consult Order details:  Orders (From admission, onward)     Start     Ordered   01/24/24 1209  CONSULT TO CALL ACT TEAM       Ordering Provider: Ellouise Richerd POUR, DO  Provider:  (Not yet assigned)  Question:  Reason for Consult?  Answer:  Psych consult   01/24/24 1208             Mode of Visit: In person    Psychiatry Consult Evaluation  Service Date: January 24, 2024 LOS:  LOS: 0 days  Chief Complaint Suicidal ideation  Primary Psychiatric Diagnoses  MDD recurrent, severe, without psychosis   Assessment  Bluford Sarath Privott is a 23 y.o. male admitted: Presented to the EDfor 01/24/2024 10:07 AM. He carries the psychiatric diagnoses of MDD and has a past medical history of  back pain, chronic knee pain, polyarthritis.  He meets criteria for outpatient based on denial of crisis criteria.  Current outpatient psychotropic medications include aripiprazole 10 mg daily, bupropion  XL 300 mg daily. He was briefly compliant with medications prior to admission as evidenced by patient report. On initial examination, patient is insightful, reports readiness to discharge home. Please see plan below for detailed recommendations.   Diagnoses:  Active Hospital problems: Principal Problem:   MDD (major depressive disorder), recurrent episode, severe (HCC)    Plan   ## Psychiatric Medication Recommendations:  Continue prior to admission medications including: -Albuterol  Ventolin  108 mcg inhaler 2 puffs every 4 hours as needed/wheezing or shortness of breath -Aripiprazole 10 mg daily/mood -Bupropion  Wellbutrin  XL 300 mg daily/mood -Naproxen  250 mg twice daily with meals/pain  ## Medical Decision Making Capacity: Not specifically addressed in this encounter  ## Further Work-up:  -- most recent EKG on 01/24/2024 had QtC of -- Pertinent labwork reviewed   ##  Disposition:-- There are no psychiatric contraindications to discharge at this time  ## Behavioral / Environmental: - No specific recommendations at this time.     ## Safety and Observation Level:  - Based on my clinical evaluation, I estimate the patient to be at low risk of self harm in the current setting. - At this time, we recommend  routine. This decision is based on my review of the chart including patient's history and current presentation, interview of the patient, mental status examination, and consideration of suicide risk including evaluating suicidal ideation, plan, intent, suicidal or self-harm behaviors, risk factors, and protective factors. This judgment is based on our ability to directly address suicide risk, implement suicide prevention strategies, and develop a safety plan while the patient is in the clinical setting. Please contact our team if there is a concern that risk level has changed.  CSSR Risk Category:C-SSRS RISK CATEGORY: High Risk  Suicide Risk Assessment: Patient has following modifiable risk factors for suicide: pain, medical illness (ie new dx of cancer), which we are addressing by continued outpatient psychiatry follow up. Patient has following non-modifiable or demographic risk factors for suicide: male gender, history of suicide attempt, and psychiatric hospitalization Patient has the following protective factors against suicide: Access to outpatient mental health care, Supportive family, Supportive friends, and no history of NSSIB  Thank you for this consult request. Recommendations have been communicated to the primary team.  We will recommend outpatient psychiatry follow up at this time.   Ellouise LITTIE Dawn, FNP       History  of Present Illness  Relevant Aspects of Hospital ED Course:  Admitted on 01/24/2024.  Patient Report:  Holdyn Poyser is a 23 year old male.  He presented to [facility] emergency department on 01/24/2024.  Upon arrival patient endorsed  suicidal ideation within the last 24 hours.  Patient is assessed by this nurse practitioner face-to-face.  Patient is reclined on hospital stretcher upon my approach.  Patient is alert and oriented, pleasant and cooperative during assessment.  Patient states when I told them I was suicidal I was talking about yesterday.  Patient presents with euthymic mood, congruent affect.  Ty verbalizes readiness to discharge home.  He is looking forward to traveling to [place]  on Monday with a friend for employment opportunity.  Patient denies suicidal and homicidal ideation.  Patient confirms took 1 naproxen  for chronic arthritis type pain.  Denies 1 naproxen  was an attempt to harm self.  Contracts verbally for safety.  Patient denies auditory and visual hallucinations.  There is no evidence of delusional thought content and no indication that patient is responding to internal stimuli.  Patient is currently homeless.  He is residing near the home of her friend.  Reports friend is supportive but his mom will not let me stay overnight.  Patient endorses average sleep and appetite.  Reports was very cold last night.  Plans to sleep at the home of her friend until leaving for [place]  on Monday.  Patient endorses alcohol and THC use.  Most recent alcohol use 2 weeks ago.  Most recent THC use 2 weeks ago.  Patient offered support and encouragement.  Verbalizes readiness to discharge home.  Confirms understanding of outpatient psychiatry follow-up plan and medications.  Tri gives verbal consent to speak with his friend and primary emotional support [facility].  Spoke with patient's friend who denies safety concerns.  Patient's friend will assist patient in finding lodging for the next 2 days.    Patient and family are educated and verbalize understanding of mental health resources and other crisis services in the community. They are instructed to call 911 and present to the nearest emergency room should patient  experience any suicidal/homicidal ideation, auditory/visual/hallucinations, or detrimental worsening of mental health condition.     Psych ROS:  Depression: history Anxiety:  none reported Mania (lifetime and current): denies Psychosis: (lifetime and current): denies  Collateral information:  Contacted friend, Calleen Potters at [phone] on 01/24/2024.   Review of Systems  Constitutional: Negative.   HENT: Negative.    Eyes: Negative.   Respiratory: Negative.    Cardiovascular: Negative.   Gastrointestinal: Negative.   Genitourinary: Negative.   Musculoskeletal: Negative.   Skin: Negative.   Neurological: Negative.   Psychiatric/Behavioral: Negative.       Psychiatric and Social History  Psychiatric History:  Information collected from patient, patient's friend/primary emotional support person, medical record   Current Psych Provider: [facility] Home Meds (current):  Therapy: [facility]  Prior Psych Hospitalization: multiple Prior Self Harm: suicide attempt 07/2023, ingested intentional overdose Prior Violence: Denies  Family Psych History: None reported Family Hx suicide: Denies  Social History:   Occupational Hx: I have a job in [place] to start as soon as I get out there Legal Hx: None reported Living Situation: currently homeless, resides with friend Access to weapons/lethal means: denies   Substance History Alcohol: 3-6 drinks Type of alcohol beer- 12 ounce Last Drink 2 weeks ago Number of drinks per day 3-6 History of alcohol withdrawal seizures denies History  of DT's denies  Illicit drugs: THC, last use approx 2 weeks ago Prescription drug abuse: Denies Rehab hx: Denies  Exam Findings  Physical Exam:  Vital Signs:  Temp:  [98 F (36.7 C)] 98 F (36.7 C) (11/07 1011) Pulse Rate:  [84] 84 (11/07 1011) Resp:  [16] 16 (11/07 1011) BP: (133)/(72) 133/72 (11/07 1011) SpO2:  [100 %] 100 % (11/07 1011) Blood pressure  133/72, pulse 84, temperature 98 F (36.7 C), temperature source Oral, resp. rate 16, SpO2 100%. There is no height or weight on file to calculate BMI.  Physical Exam Vitals and nursing note reviewed.  Constitutional:      Appearance: Normal appearance. He is normal weight.  HENT:     Head: Normocephalic and atraumatic.     Nose: Nose normal.  Cardiovascular:     Rate and Rhythm: Normal rate.  Pulmonary:     Effort: Pulmonary effort is normal.  Musculoskeletal:        General: Normal range of motion.     Cervical back: Normal range of motion.  Skin:    General: Skin is warm and dry.  Neurological:     Mental Status: He is alert and oriented to person, place, and time.  Psychiatric:        Attention and Perception: Attention and perception normal.        Mood and Affect: Mood and affect normal.        Speech: Speech normal.        Behavior: Behavior normal. Behavior is cooperative.        Thought Content: Thought content normal.        Cognition and Memory: Cognition and memory normal.        Judgment: Judgment normal.     Mental Status Exam: General Appearance: Casual and Fairly Groomed  Orientation:  Full (Time, Place, and Person)  Memory:  Immediate;   Good Recent;   Good Remote;   Good  Concentration:  Concentration: Good and Attention Span: Good  Recall:  Good  Attention  Good  Eye Contact:  Good  Speech:  Clear and Coherent and Normal Rate  Language:  Good  Volume:  Normal  Mood: euthymic  Affect:  Appropriate and Congruent  Thought Process:  Coherent, Goal Directed, and Linear  Thought Content:  WDL and Logical  Suicidal Thoughts:  No  Homicidal Thoughts:  No  Judgement:  Good  Insight:  Fair  Psychomotor Activity:  Normal  Akathisia:  No  Fund of Knowledge:  Good      Assets:  Communication Skills Desire for Improvement Financial Resources/Insurance Leisure Time Physical Health Resilience Social Support Transportation Vocational/Educational   Cognition:  WNL  ADL's:  Intact  AIMS (if indicated):        Other History   These have been pulled in through the EMR, reviewed, and updated if appropriate.  Family History:  The patient's family history includes Asthma in his father; COPD in his father; Diabetes in his father; Hyperlipidemia in his father; Hypertension in his father; Migraines in his brother and mother; Stroke in his maternal grandmother.  Medical History: Past Medical History:  Diagnosis Date   Acne    Allergy    Anxiety    Asthma    Chronic headaches    Depression    GERD (gastroesophageal reflux disease)     Surgical History: Past Surgical History:  Procedure Laterality Date   OPEN REDUCTION INTERNAL FIXATION (ORIF) METACARPAL Right 11/19/2018   Procedure:  Right small finger metacarpal open malunion repair;  Surgeon: Shari Easter, MD;  Location: [facility];  Service: Orthopedics;  Laterality: Right;  90 minutes   TYMPANOSTOMY TUBE PLACEMENT Bilateral    placed age 54 have fallen out   UPPER GASTROINTESTINAL ENDOSCOPY     WISDOM TOOTH EXTRACTION  04/04/2021     Medications:   Current Facility-Administered Medications:    albuterol  (VENTOLIN  HFA) 108 (90 Base) MCG/ACT inhaler 2 puff, 2 puff, Inhalation, Q4H PRN, Kingsley, Victoria K, DO   ARIPiprazole (ABILIFY) tablet 10 mg, 10 mg, Oral, Daily, Kingsley, Victoria K, DO, 10 mg at 01/24/24 1215   buPROPion  (WELLBUTRIN  XL) 24 hr tablet 300 mg, 300 mg, Oral, Daily, Kingsley, Victoria K, DO, 300 mg at 01/24/24 1215   naproxen  (NAPROSYN ) tablet 250 mg, 250 mg, Oral, BID WC, Ellouise, Victoria K, DO  Current Outpatient Medications:    albuterol  (VENTOLIN  HFA) 108 (90 Base) MCG/ACT inhaler, Inhale 2 puffs into the lungs every 4 (four) hours as needed for wheezing or shortness of breath., Disp: , Rfl:    ARIPiprazole (ABILIFY) 10 MG tablet, Take 1 tablet (10 mg total) by mouth daily., Disp: 30 tablet, Rfl: 0   buPROPion  (WELLBUTRIN  XL) 300 MG  24 hr tablet, Take 1 tablet (300 mg total) by mouth daily., Disp: 30 tablet, Rfl: 0   naproxen  (NAPROSYN ) 250 MG tablet, Take 1 tablet (250 mg total) by mouth 2 (two) times daily with a meal., Disp: 28 tablet, Rfl: 0  Allergies: Allergies  Allergen Reactions   Chicken Allergy Other (See Comments)    Tears stomach up   Egg Protein-Containing Drug Products Nausea And Vomiting    Ellouise LITTIE Dawn, FNP

## 2024-01-24 NOTE — ED Notes (Signed)
 Patient offered food, accepted and gave sandwich and beverage.  Patient denies other needs at this time.

## 2024-03-16 NOTE — Progress Notes (Signed)
 The patient attended a screening event on 11/25/2023 where his BP screening results was 110/62, non-fasting blood glucose was 98. At the event the patient noted he has [employer] and pt does smoke tobacco. At the screening event pt indicated having food, housing SDOH insecurities. Pt did not list pcp. Per chart review there are no past or future visits within the past 12 months for pt.  Post event initial f/u CHW attempted and was unable to contact pt via mobile phone #. A vm was left on the mobile number. Letter sent with [facility] and [facility] care clinic PCP resource flyers, Food, Housing SDOH and How To Quit Tobacco information flyers in case needed by pt. Additional pt f/u to be scheduled per health equity protocol.    During the 60 f/u CHW called number on file and someone answered stating that the pt no longer lives at that address anymore. Person stated she would pass the message if she came across him. She also did not have a number to provide for pt. CHW will resend letter via [facility] [employer] and will send SDOH resources via email (both emails on file). There will be one last follow up attempt at a later date.
